# Patient Record
Sex: Female | Born: 1935 | Race: White | Hispanic: No | Smoking: Never smoker
Health system: Southern US, Community
[De-identification: ages and names within clinical notes are randomized; demographics above are authoritative.]

## PROBLEM LIST (undated history)

## (undated) DIAGNOSIS — I10 Essential (primary) hypertension: Secondary | ICD-10-CM

## (undated) DIAGNOSIS — Z8719 Personal history of other diseases of the digestive system: Secondary | ICD-10-CM

## (undated) DIAGNOSIS — E785 Hyperlipidemia, unspecified: Secondary | ICD-10-CM

## (undated) DIAGNOSIS — N184 Chronic kidney disease, stage 4 (severe): Secondary | ICD-10-CM

## (undated) DIAGNOSIS — E079 Disorder of thyroid, unspecified: Secondary | ICD-10-CM

## (undated) DIAGNOSIS — M109 Gout, unspecified: Secondary | ICD-10-CM

## (undated) HISTORY — PX: BREAST SURGERY: SHX581

---

## 2004-08-26 ENCOUNTER — Ambulatory Visit: Payer: Self-pay | Admitting: Gastroenterology

## 2004-12-29 ENCOUNTER — Ambulatory Visit: Payer: Self-pay | Admitting: Internal Medicine

## 2006-01-11 ENCOUNTER — Ambulatory Visit: Payer: Self-pay | Admitting: Internal Medicine

## 2006-05-28 ENCOUNTER — Ambulatory Visit: Payer: Self-pay

## 2006-07-21 ENCOUNTER — Ambulatory Visit: Payer: Self-pay | Admitting: Otolaryngology

## 2007-01-17 ENCOUNTER — Ambulatory Visit: Payer: Self-pay | Admitting: Internal Medicine

## 2008-01-19 ENCOUNTER — Ambulatory Visit: Payer: Self-pay | Admitting: Internal Medicine

## 2009-03-19 ENCOUNTER — Ambulatory Visit: Payer: Self-pay | Admitting: Internal Medicine

## 2009-04-03 ENCOUNTER — Inpatient Hospital Stay: Payer: Self-pay | Admitting: Internal Medicine

## 2009-04-15 ENCOUNTER — Emergency Department: Payer: Self-pay | Admitting: Internal Medicine

## 2009-09-16 ENCOUNTER — Ambulatory Visit: Payer: Self-pay | Admitting: Internal Medicine

## 2011-10-28 ENCOUNTER — Ambulatory Visit: Payer: Self-pay | Admitting: Internal Medicine

## 2013-09-04 ENCOUNTER — Emergency Department: Payer: Self-pay | Admitting: Emergency Medicine

## 2013-09-04 LAB — BODY FLUID CELL COUNT WITH DIFFERENTIAL
Basophil: 0 %
EOS PCT: 0 %
Lymphocytes: 2 %
Neutrophils: 88 %
Nucleated Cell Count: 29924 /mm3
OTHER CELLS BF: 0 %
OTHER MONONUCLEAR CELLS: 10 %

## 2013-09-04 LAB — SYNOVIAL FLUID, CRYSTAL

## 2013-09-08 LAB — BODY FLUID CULTURE

## 2013-10-12 ENCOUNTER — Other Ambulatory Visit: Payer: Self-pay | Admitting: Rheumatology

## 2013-10-12 LAB — SYNOVIAL CELL COUNT + DIFF, W/ CRYSTALS
Basophil: 0 %
Eosinophil: 0 %
LYMPHS PCT: 3 %
Neutrophils: 87 %
Nucleated Cell Count: 1299 /mm3
Other Cells BF: 2 %
Other Mononuclear Cells: 8 %

## 2013-12-26 ENCOUNTER — Ambulatory Visit: Payer: Self-pay | Admitting: Internal Medicine

## 2014-01-15 ENCOUNTER — Emergency Department: Payer: Self-pay | Admitting: Emergency Medicine

## 2014-01-15 LAB — CK TOTAL AND CKMB (NOT AT ARMC)
CK, Total: 72 U/L
CK-MB: 1.6 ng/mL (ref 0.5–3.6)

## 2014-01-15 LAB — URINALYSIS, COMPLETE
Bilirubin,UR: NEGATIVE
Glucose,UR: NEGATIVE mg/dL (ref 0–75)
Ketone: NEGATIVE
Nitrite: POSITIVE
PH: 5 (ref 4.5–8.0)
Protein: NEGATIVE
RBC,UR: 1 /HPF (ref 0–5)
SPECIFIC GRAVITY: 1.014 (ref 1.003–1.030)
Squamous Epithelial: <1

## 2014-01-15 LAB — CBC
HCT: 40.4 % (ref 35.0–47.0)
HGB: 12.8 g/dL (ref 12.0–16.0)
MCH: 29.7 pg (ref 26.0–34.0)
MCHC: 31.7 g/dL — AB (ref 32.0–36.0)
MCV: 94 fL (ref 80–100)
Platelet: 275 10*3/uL (ref 150–440)
RBC: 4.3 10*6/uL (ref 3.80–5.20)
RDW: 14.8 % — ABNORMAL HIGH (ref 11.5–14.5)
WBC: 11.9 10*3/uL — AB (ref 3.6–11.0)

## 2014-01-15 LAB — COMPREHENSIVE METABOLIC PANEL
ALK PHOS: 94 U/L
ALT: 17 U/L
Albumin: 3.7 g/dL (ref 3.4–5.0)
Anion Gap: 6 — ABNORMAL LOW (ref 7–16)
BUN: 35 mg/dL — ABNORMAL HIGH (ref 7–18)
Bilirubin,Total: 0.3 mg/dL (ref 0.2–1.0)
CREATININE: 1.5 mg/dL — AB (ref 0.60–1.30)
Calcium, Total: 9 mg/dL (ref 8.5–10.1)
Chloride: 110 mmol/L — ABNORMAL HIGH (ref 98–107)
Co2: 24 mmol/L (ref 21–32)
GFR CALC AF AMER: 35 — AB
GFR CALC NON AF AMER: 31 — AB
GLUCOSE: 105 mg/dL — AB (ref 65–99)
Osmolality: 288 (ref 275–301)
Potassium: 4.6 mmol/L (ref 3.5–5.1)
SGOT(AST): 27 U/L (ref 15–37)
Sodium: 140 mmol/L (ref 136–145)
Total Protein: 8.9 g/dL — ABNORMAL HIGH (ref 6.4–8.2)

## 2014-01-15 LAB — TROPONIN I

## 2014-01-17 LAB — URINE CULTURE

## 2014-06-17 ENCOUNTER — Emergency Department: Payer: Self-pay | Admitting: Emergency Medicine

## 2014-06-17 LAB — BASIC METABOLIC PANEL
ANION GAP: 7 (ref 7–16)
BUN: 33 mg/dL — AB (ref 7–18)
CHLORIDE: 111 mmol/L — AB (ref 98–107)
CO2: 24 mmol/L (ref 21–32)
CREATININE: 1.85 mg/dL — AB (ref 0.60–1.30)
Calcium, Total: 8.7 mg/dL (ref 8.5–10.1)
GFR CALC AF AMER: 33 — AB
GFR CALC NON AF AMER: 27 — AB
Glucose: 100 mg/dL — ABNORMAL HIGH (ref 65–99)
OSMOLALITY: 290 (ref 275–301)
POTASSIUM: 4.5 mmol/L (ref 3.5–5.1)
SODIUM: 142 mmol/L (ref 136–145)

## 2014-06-17 LAB — CBC
HCT: 32.6 % — AB (ref 35.0–47.0)
HGB: 10.3 g/dL — AB (ref 12.0–16.0)
MCH: 29.4 pg (ref 26.0–34.0)
MCHC: 31.6 g/dL — ABNORMAL LOW (ref 32.0–36.0)
MCV: 93 fL (ref 80–100)
Platelet: 242 10*3/uL (ref 150–440)
RBC: 3.51 10*6/uL — AB (ref 3.80–5.20)
RDW: 15.6 % — AB (ref 11.5–14.5)
WBC: 10 10*3/uL (ref 3.6–11.0)

## 2014-06-17 LAB — TROPONIN I: Troponin-I: <0.02 ng/mL

## 2014-07-12 DIAGNOSIS — E538 Deficiency of other specified B group vitamins: Secondary | ICD-10-CM | POA: Diagnosis not present

## 2014-07-12 DIAGNOSIS — M545 Low back pain: Secondary | ICD-10-CM | POA: Diagnosis not present

## 2014-07-12 DIAGNOSIS — M4856XA Collapsed vertebra, not elsewhere classified, lumbar region, initial encounter for fracture: Secondary | ICD-10-CM | POA: Diagnosis not present

## 2014-07-17 DIAGNOSIS — S32010A Wedge compression fracture of first lumbar vertebra, initial encounter for closed fracture: Secondary | ICD-10-CM | POA: Diagnosis not present

## 2014-07-18 ENCOUNTER — Ambulatory Visit: Payer: Self-pay | Admitting: Unknown Physician Specialty

## 2014-07-18 DIAGNOSIS — M5386 Other specified dorsopathies, lumbar region: Secondary | ICD-10-CM | POA: Diagnosis not present

## 2014-07-18 DIAGNOSIS — M4856XA Collapsed vertebra, not elsewhere classified, lumbar region, initial encounter for fracture: Secondary | ICD-10-CM | POA: Diagnosis not present

## 2014-07-18 DIAGNOSIS — M5136 Other intervertebral disc degeneration, lumbar region: Secondary | ICD-10-CM | POA: Diagnosis not present

## 2014-07-18 DIAGNOSIS — M542 Cervicalgia: Secondary | ICD-10-CM | POA: Diagnosis not present

## 2014-07-18 DIAGNOSIS — M47816 Spondylosis without myelopathy or radiculopathy, lumbar region: Secondary | ICD-10-CM | POA: Diagnosis not present

## 2014-07-18 DIAGNOSIS — M4854XA Collapsed vertebra, not elsewhere classified, thoracic region, initial encounter for fracture: Secondary | ICD-10-CM | POA: Diagnosis not present

## 2014-07-18 DIAGNOSIS — S32010A Wedge compression fracture of first lumbar vertebra, initial encounter for closed fracture: Secondary | ICD-10-CM | POA: Diagnosis not present

## 2014-07-18 DIAGNOSIS — M5022 Other cervical disc displacement, mid-cervical region: Secondary | ICD-10-CM | POA: Diagnosis not present

## 2014-07-18 DIAGNOSIS — M4846XA Fatigue fracture of vertebra, lumbar region, initial encounter for fracture: Secondary | ICD-10-CM | POA: Diagnosis not present

## 2014-07-18 DIAGNOSIS — I714 Abdominal aortic aneurysm, without rupture: Secondary | ICD-10-CM | POA: Diagnosis not present

## 2014-07-18 DIAGNOSIS — S161XXA Strain of muscle, fascia and tendon at neck level, initial encounter: Secondary | ICD-10-CM | POA: Diagnosis not present

## 2014-07-18 DIAGNOSIS — M4806 Spinal stenosis, lumbar region: Secondary | ICD-10-CM | POA: Diagnosis not present

## 2014-07-23 ENCOUNTER — Ambulatory Visit: Payer: Self-pay | Admitting: Orthopedic Surgery

## 2014-07-24 ENCOUNTER — Ambulatory Visit: Payer: Self-pay | Admitting: Orthopedic Surgery

## 2014-07-24 DIAGNOSIS — S32010A Wedge compression fracture of first lumbar vertebra, initial encounter for closed fracture: Secondary | ICD-10-CM | POA: Diagnosis not present

## 2014-07-24 DIAGNOSIS — R0602 Shortness of breath: Secondary | ICD-10-CM | POA: Diagnosis not present

## 2014-07-24 DIAGNOSIS — Z9889 Other specified postprocedural states: Secondary | ICD-10-CM | POA: Diagnosis not present

## 2014-07-24 DIAGNOSIS — Z79899 Other long term (current) drug therapy: Secondary | ICD-10-CM | POA: Diagnosis not present

## 2014-07-24 DIAGNOSIS — M810 Age-related osteoporosis without current pathological fracture: Secondary | ICD-10-CM | POA: Diagnosis not present

## 2014-07-24 DIAGNOSIS — M8008XA Age-related osteoporosis with current pathological fracture, vertebra(e), initial encounter for fracture: Secondary | ICD-10-CM | POA: Diagnosis not present

## 2014-07-24 DIAGNOSIS — Z8249 Family history of ischemic heart disease and other diseases of the circulatory system: Secondary | ICD-10-CM | POA: Diagnosis not present

## 2014-07-24 DIAGNOSIS — E785 Hyperlipidemia, unspecified: Secondary | ICD-10-CM | POA: Diagnosis not present

## 2014-07-24 DIAGNOSIS — S32000A Wedge compression fracture of unspecified lumbar vertebra, initial encounter for closed fracture: Secondary | ICD-10-CM | POA: Diagnosis not present

## 2014-07-24 DIAGNOSIS — Z9841 Cataract extraction status, right eye: Secondary | ICD-10-CM | POA: Diagnosis not present

## 2014-07-24 DIAGNOSIS — I129 Hypertensive chronic kidney disease with stage 1 through stage 4 chronic kidney disease, or unspecified chronic kidney disease: Secondary | ICD-10-CM | POA: Diagnosis not present

## 2014-07-24 DIAGNOSIS — E039 Hypothyroidism, unspecified: Secondary | ICD-10-CM | POA: Diagnosis not present

## 2014-07-24 DIAGNOSIS — E1121 Type 2 diabetes mellitus with diabetic nephropathy: Secondary | ICD-10-CM | POA: Diagnosis not present

## 2014-07-24 DIAGNOSIS — H9193 Unspecified hearing loss, bilateral: Secondary | ICD-10-CM | POA: Diagnosis not present

## 2014-07-24 DIAGNOSIS — E790 Hyperuricemia without signs of inflammatory arthritis and tophaceous disease: Secondary | ICD-10-CM | POA: Diagnosis not present

## 2014-07-24 DIAGNOSIS — L300 Nummular dermatitis: Secondary | ICD-10-CM | POA: Diagnosis not present

## 2014-07-24 DIAGNOSIS — S32019A Unspecified fracture of first lumbar vertebra, initial encounter for closed fracture: Secondary | ICD-10-CM | POA: Diagnosis not present

## 2014-07-24 DIAGNOSIS — E538 Deficiency of other specified B group vitamins: Secondary | ICD-10-CM | POA: Diagnosis not present

## 2014-07-24 DIAGNOSIS — Z9842 Cataract extraction status, left eye: Secondary | ICD-10-CM | POA: Diagnosis not present

## 2014-07-24 DIAGNOSIS — N183 Chronic kidney disease, stage 3 (moderate): Secondary | ICD-10-CM | POA: Diagnosis not present

## 2014-07-24 DIAGNOSIS — M170 Bilateral primary osteoarthritis of knee: Secondary | ICD-10-CM | POA: Diagnosis not present

## 2014-07-24 DIAGNOSIS — I447 Left bundle-branch block, unspecified: Secondary | ICD-10-CM | POA: Diagnosis not present

## 2014-07-24 DIAGNOSIS — E559 Vitamin D deficiency, unspecified: Secondary | ICD-10-CM | POA: Diagnosis not present

## 2014-07-24 DIAGNOSIS — Z8489 Family history of other specified conditions: Secondary | ICD-10-CM | POA: Diagnosis not present

## 2014-08-08 DIAGNOSIS — Z9889 Other specified postprocedural states: Secondary | ICD-10-CM | POA: Diagnosis not present

## 2014-09-18 DIAGNOSIS — I129 Hypertensive chronic kidney disease with stage 1 through stage 4 chronic kidney disease, or unspecified chronic kidney disease: Secondary | ICD-10-CM | POA: Diagnosis not present

## 2014-09-18 DIAGNOSIS — D631 Anemia in chronic kidney disease: Secondary | ICD-10-CM | POA: Diagnosis not present

## 2014-09-18 DIAGNOSIS — N2581 Secondary hyperparathyroidism of renal origin: Secondary | ICD-10-CM | POA: Diagnosis not present

## 2014-09-18 DIAGNOSIS — E1122 Type 2 diabetes mellitus with diabetic chronic kidney disease: Secondary | ICD-10-CM | POA: Diagnosis not present

## 2014-09-18 DIAGNOSIS — N184 Chronic kidney disease, stage 4 (severe): Secondary | ICD-10-CM | POA: Diagnosis not present

## 2014-09-19 DIAGNOSIS — E538 Deficiency of other specified B group vitamins: Secondary | ICD-10-CM | POA: Diagnosis not present

## 2014-09-26 DIAGNOSIS — N184 Chronic kidney disease, stage 4 (severe): Secondary | ICD-10-CM | POA: Diagnosis not present

## 2014-10-05 DIAGNOSIS — N2581 Secondary hyperparathyroidism of renal origin: Secondary | ICD-10-CM | POA: Diagnosis not present

## 2014-10-05 DIAGNOSIS — N184 Chronic kidney disease, stage 4 (severe): Secondary | ICD-10-CM | POA: Diagnosis not present

## 2014-10-05 DIAGNOSIS — R809 Proteinuria, unspecified: Secondary | ICD-10-CM | POA: Diagnosis not present

## 2014-10-05 DIAGNOSIS — E1122 Type 2 diabetes mellitus with diabetic chronic kidney disease: Secondary | ICD-10-CM | POA: Diagnosis not present

## 2014-10-05 DIAGNOSIS — I129 Hypertensive chronic kidney disease with stage 1 through stage 4 chronic kidney disease, or unspecified chronic kidney disease: Secondary | ICD-10-CM | POA: Diagnosis not present

## 2014-10-06 DIAGNOSIS — H5319 Other subjective visual disturbances: Secondary | ICD-10-CM | POA: Diagnosis not present

## 2014-10-15 LAB — SURGICAL PATHOLOGY

## 2014-10-19 DIAGNOSIS — I129 Hypertensive chronic kidney disease with stage 1 through stage 4 chronic kidney disease, or unspecified chronic kidney disease: Secondary | ICD-10-CM | POA: Diagnosis not present

## 2014-10-19 DIAGNOSIS — N184 Chronic kidney disease, stage 4 (severe): Secondary | ICD-10-CM | POA: Diagnosis not present

## 2014-10-21 NOTE — Op Note (Signed)
PATIENT NAME:  Jeanette CapriceRAINEY, Judyann M MR#:  [id number] DATE OF BIRTH:  February 21, 1923  DATE OF PROCEDURE:  07/24/2014  PREOPERATIVE DIAGNOSIS: L1 compression fracture.   POSTOPERATIVE DIAGNOSIS: L1 compression fracture.   PROCEDURE: L1 kyphoplasty.   ANESTHESIA: MAC.   SURGEON: Kennedy BuckerMichael Orlanda Frankum, MD   DESCRIPTION OF PROCEDURE: The patient was brought to the operating room and after adequate sedation was given, the patient was placed prone, and AP and lateral imaging obtained of the L1 vertebral body. After a timeout procedure was completed, 10 mL of 1% Xylocaine was infiltrated subcutaneously on either side of L1 at the appropriate level for injection. The back was then prepped and draped in the usual sterile fashion, appropriate patient identification and timeout procedure completed. Spinal needle was used to get down to the pedicle and a mixture of 0.5% Sensorcaine with epinephrine and 1% Xylocaine was infiltrated along the tract of the skin. A small stab incision was made and a trocar advanced into the vertebral body and a biopsy obtained. Drilling was carried out and then a balloon inflated approximately 2.5 mL. When the cement was the appropriate consistency, the vertebral body was filled with good fill, partial correction of the deformity at L1 with approximately 3.5 mL cement infiltrated. The trocar was removed and permanent C-arm views were obtained. The wound was closed with Dermabond and covered with a Band-Aid. The patient was sent to the recovery room in stable condition.   ESTIMATED BLOOD LOSS: Minimal.   COMPLICATIONS: None.   SPECIMEN: L1 vertebral body biopsy.    ____________________________ Leitha SchullerMichael J. Ommie Degeorge, MD mjm:bm D: 07/24/2014 18:57:31 ET T: 07/25/2014 01:02:52 ET JOB#: [id number]  cc: Leitha SchullerMichael J. Jashua Knaak, MD, <Dictator> Leitha SchullerMICHAEL J Staceyann Knouff MD ELECTRONICALLY SIGNED 07/25/2014 8:08

## 2014-11-08 DIAGNOSIS — H3531 Nonexudative age-related macular degeneration: Secondary | ICD-10-CM | POA: Diagnosis not present

## 2014-12-06 DIAGNOSIS — E039 Hypothyroidism, unspecified: Secondary | ICD-10-CM | POA: Diagnosis not present

## 2014-12-06 DIAGNOSIS — E538 Deficiency of other specified B group vitamins: Secondary | ICD-10-CM | POA: Diagnosis not present

## 2014-12-06 DIAGNOSIS — M810 Age-related osteoporosis without current pathological fracture: Secondary | ICD-10-CM | POA: Diagnosis not present

## 2014-12-06 DIAGNOSIS — E1129 Type 2 diabetes mellitus with other diabetic kidney complication: Secondary | ICD-10-CM | POA: Diagnosis not present

## 2015-01-04 DIAGNOSIS — E1122 Type 2 diabetes mellitus with diabetic chronic kidney disease: Secondary | ICD-10-CM | POA: Diagnosis not present

## 2015-01-04 DIAGNOSIS — R809 Proteinuria, unspecified: Secondary | ICD-10-CM | POA: Diagnosis not present

## 2015-01-04 DIAGNOSIS — N2581 Secondary hyperparathyroidism of renal origin: Secondary | ICD-10-CM | POA: Diagnosis not present

## 2015-01-04 DIAGNOSIS — I10 Essential (primary) hypertension: Secondary | ICD-10-CM | POA: Diagnosis not present

## 2015-01-04 DIAGNOSIS — N184 Chronic kidney disease, stage 4 (severe): Secondary | ICD-10-CM | POA: Diagnosis not present

## 2015-01-07 DIAGNOSIS — E538 Deficiency of other specified B group vitamins: Secondary | ICD-10-CM | POA: Diagnosis not present

## 2015-01-30 DIAGNOSIS — M810 Age-related osteoporosis without current pathological fracture: Secondary | ICD-10-CM | POA: Diagnosis not present

## 2015-01-30 DIAGNOSIS — N183 Chronic kidney disease, stage 3 (moderate): Secondary | ICD-10-CM | POA: Diagnosis not present

## 2015-02-06 DIAGNOSIS — E538 Deficiency of other specified B group vitamins: Secondary | ICD-10-CM | POA: Diagnosis not present

## 2015-02-22 DIAGNOSIS — M810 Age-related osteoporosis without current pathological fracture: Secondary | ICD-10-CM | POA: Diagnosis not present

## 2015-03-11 DIAGNOSIS — E538 Deficiency of other specified B group vitamins: Secondary | ICD-10-CM | POA: Diagnosis not present

## 2015-03-31 DIAGNOSIS — Z23 Encounter for immunization: Secondary | ICD-10-CM | POA: Diagnosis not present

## 2015-04-04 DIAGNOSIS — D631 Anemia in chronic kidney disease: Secondary | ICD-10-CM | POA: Diagnosis not present

## 2015-04-04 DIAGNOSIS — N184 Chronic kidney disease, stage 4 (severe): Secondary | ICD-10-CM | POA: Diagnosis not present

## 2015-04-04 DIAGNOSIS — I10 Essential (primary) hypertension: Secondary | ICD-10-CM | POA: Diagnosis not present

## 2015-04-04 DIAGNOSIS — N2581 Secondary hyperparathyroidism of renal origin: Secondary | ICD-10-CM | POA: Diagnosis not present

## 2015-04-09 DIAGNOSIS — I129 Hypertensive chronic kidney disease with stage 1 through stage 4 chronic kidney disease, or unspecified chronic kidney disease: Secondary | ICD-10-CM | POA: Diagnosis not present

## 2015-04-09 DIAGNOSIS — E1122 Type 2 diabetes mellitus with diabetic chronic kidney disease: Secondary | ICD-10-CM | POA: Diagnosis not present

## 2015-04-09 DIAGNOSIS — R319 Hematuria, unspecified: Secondary | ICD-10-CM | POA: Diagnosis not present

## 2015-04-09 DIAGNOSIS — R809 Proteinuria, unspecified: Secondary | ICD-10-CM | POA: Diagnosis not present

## 2015-04-09 DIAGNOSIS — N184 Chronic kidney disease, stage 4 (severe): Secondary | ICD-10-CM | POA: Diagnosis not present

## 2015-04-09 DIAGNOSIS — N2581 Secondary hyperparathyroidism of renal origin: Secondary | ICD-10-CM | POA: Diagnosis not present

## 2015-04-09 DIAGNOSIS — D631 Anemia in chronic kidney disease: Secondary | ICD-10-CM | POA: Diagnosis not present

## 2015-04-11 DIAGNOSIS — E538 Deficiency of other specified B group vitamins: Secondary | ICD-10-CM | POA: Diagnosis not present

## 2015-05-15 DIAGNOSIS — E538 Deficiency of other specified B group vitamins: Secondary | ICD-10-CM | POA: Diagnosis not present

## 2015-06-07 DIAGNOSIS — N189 Chronic kidney disease, unspecified: Secondary | ICD-10-CM | POA: Diagnosis not present

## 2015-06-07 DIAGNOSIS — D631 Anemia in chronic kidney disease: Secondary | ICD-10-CM | POA: Diagnosis not present

## 2015-06-07 DIAGNOSIS — M810 Age-related osteoporosis without current pathological fracture: Secondary | ICD-10-CM | POA: Diagnosis not present

## 2015-06-07 DIAGNOSIS — E538 Deficiency of other specified B group vitamins: Secondary | ICD-10-CM | POA: Diagnosis not present

## 2015-06-07 DIAGNOSIS — I10 Essential (primary) hypertension: Secondary | ICD-10-CM | POA: Diagnosis not present

## 2015-06-07 DIAGNOSIS — E559 Vitamin D deficiency, unspecified: Secondary | ICD-10-CM | POA: Diagnosis not present

## 2015-06-07 DIAGNOSIS — R634 Abnormal weight loss: Secondary | ICD-10-CM | POA: Diagnosis not present

## 2015-06-07 DIAGNOSIS — E1122 Type 2 diabetes mellitus with diabetic chronic kidney disease: Secondary | ICD-10-CM | POA: Diagnosis not present

## 2015-06-07 DIAGNOSIS — N183 Chronic kidney disease, stage 3 (moderate): Secondary | ICD-10-CM | POA: Diagnosis not present

## 2015-06-07 DIAGNOSIS — E039 Hypothyroidism, unspecified: Secondary | ICD-10-CM | POA: Diagnosis not present

## 2015-06-14 DIAGNOSIS — E538 Deficiency of other specified B group vitamins: Secondary | ICD-10-CM | POA: Diagnosis not present

## 2015-07-16 DIAGNOSIS — E538 Deficiency of other specified B group vitamins: Secondary | ICD-10-CM | POA: Diagnosis not present

## 2015-08-12 DIAGNOSIS — E538 Deficiency of other specified B group vitamins: Secondary | ICD-10-CM | POA: Diagnosis not present

## 2015-08-12 DIAGNOSIS — E790 Hyperuricemia without signs of inflammatory arthritis and tophaceous disease: Secondary | ICD-10-CM | POA: Diagnosis not present

## 2015-08-12 DIAGNOSIS — I10 Essential (primary) hypertension: Secondary | ICD-10-CM | POA: Diagnosis not present

## 2015-08-12 DIAGNOSIS — E039 Hypothyroidism, unspecified: Secondary | ICD-10-CM | POA: Diagnosis not present

## 2015-08-12 DIAGNOSIS — D631 Anemia in chronic kidney disease: Secondary | ICD-10-CM | POA: Diagnosis not present

## 2015-08-12 DIAGNOSIS — N183 Chronic kidney disease, stage 3 (moderate): Secondary | ICD-10-CM | POA: Diagnosis not present

## 2015-08-12 DIAGNOSIS — M25511 Pain in right shoulder: Secondary | ICD-10-CM | POA: Diagnosis not present

## 2015-08-15 DIAGNOSIS — M7551 Bursitis of right shoulder: Secondary | ICD-10-CM | POA: Diagnosis not present

## 2015-08-15 DIAGNOSIS — M25511 Pain in right shoulder: Secondary | ICD-10-CM | POA: Diagnosis not present

## 2015-08-16 DIAGNOSIS — E538 Deficiency of other specified B group vitamins: Secondary | ICD-10-CM | POA: Diagnosis not present

## 2015-08-29 DIAGNOSIS — M810 Age-related osteoporosis without current pathological fracture: Secondary | ICD-10-CM | POA: Diagnosis not present

## 2015-09-16 DIAGNOSIS — A499 Bacterial infection, unspecified: Secondary | ICD-10-CM | POA: Diagnosis not present

## 2015-09-16 DIAGNOSIS — R700 Elevated erythrocyte sedimentation rate: Secondary | ICD-10-CM | POA: Diagnosis not present

## 2015-09-16 DIAGNOSIS — F0390 Unspecified dementia without behavioral disturbance: Secondary | ICD-10-CM | POA: Diagnosis not present

## 2015-09-16 DIAGNOSIS — R079 Chest pain, unspecified: Secondary | ICD-10-CM | POA: Diagnosis not present

## 2015-09-16 DIAGNOSIS — N390 Urinary tract infection, site not specified: Secondary | ICD-10-CM | POA: Diagnosis not present

## 2015-09-16 DIAGNOSIS — E538 Deficiency of other specified B group vitamins: Secondary | ICD-10-CM | POA: Diagnosis not present

## 2015-09-16 DIAGNOSIS — N184 Chronic kidney disease, stage 4 (severe): Secondary | ICD-10-CM | POA: Diagnosis not present

## 2015-09-16 DIAGNOSIS — I10 Essential (primary) hypertension: Secondary | ICD-10-CM | POA: Diagnosis not present

## 2015-09-16 DIAGNOSIS — M791 Myalgia: Secondary | ICD-10-CM | POA: Diagnosis not present

## 2015-09-16 DIAGNOSIS — E039 Hypothyroidism, unspecified: Secondary | ICD-10-CM | POA: Diagnosis not present

## 2015-09-16 DIAGNOSIS — K5900 Constipation, unspecified: Secondary | ICD-10-CM | POA: Diagnosis not present

## 2015-09-16 DIAGNOSIS — I447 Left bundle-branch block, unspecified: Secondary | ICD-10-CM | POA: Diagnosis not present

## 2015-09-16 DIAGNOSIS — N183 Chronic kidney disease, stage 3 (moderate): Secondary | ICD-10-CM | POA: Diagnosis not present

## 2015-09-16 DIAGNOSIS — R5381 Other malaise: Secondary | ICD-10-CM | POA: Diagnosis not present

## 2015-09-26 DIAGNOSIS — E7800 Pure hypercholesterolemia, unspecified: Secondary | ICD-10-CM | POA: Diagnosis not present

## 2015-09-26 DIAGNOSIS — I10 Essential (primary) hypertension: Secondary | ICD-10-CM | POA: Diagnosis not present

## 2015-09-26 DIAGNOSIS — I447 Left bundle-branch block, unspecified: Secondary | ICD-10-CM | POA: Diagnosis not present

## 2015-10-02 DIAGNOSIS — R809 Proteinuria, unspecified: Secondary | ICD-10-CM | POA: Diagnosis not present

## 2015-10-02 DIAGNOSIS — I129 Hypertensive chronic kidney disease with stage 1 through stage 4 chronic kidney disease, or unspecified chronic kidney disease: Secondary | ICD-10-CM | POA: Diagnosis not present

## 2015-10-02 DIAGNOSIS — N2581 Secondary hyperparathyroidism of renal origin: Secondary | ICD-10-CM | POA: Diagnosis not present

## 2015-10-02 DIAGNOSIS — N184 Chronic kidney disease, stage 4 (severe): Secondary | ICD-10-CM | POA: Diagnosis not present

## 2015-10-16 DIAGNOSIS — E538 Deficiency of other specified B group vitamins: Secondary | ICD-10-CM | POA: Diagnosis not present

## 2015-11-14 DIAGNOSIS — Z23 Encounter for immunization: Secondary | ICD-10-CM | POA: Diagnosis not present

## 2015-12-17 DIAGNOSIS — R768 Other specified abnormal immunological findings in serum: Secondary | ICD-10-CM | POA: Diagnosis not present

## 2015-12-17 DIAGNOSIS — E1122 Type 2 diabetes mellitus with diabetic chronic kidney disease: Secondary | ICD-10-CM | POA: Diagnosis not present

## 2015-12-17 DIAGNOSIS — E538 Deficiency of other specified B group vitamins: Secondary | ICD-10-CM | POA: Diagnosis not present

## 2015-12-17 DIAGNOSIS — N184 Chronic kidney disease, stage 4 (severe): Secondary | ICD-10-CM | POA: Diagnosis not present

## 2015-12-17 DIAGNOSIS — M810 Age-related osteoporosis without current pathological fracture: Secondary | ICD-10-CM | POA: Diagnosis not present

## 2015-12-17 DIAGNOSIS — I10 Essential (primary) hypertension: Secondary | ICD-10-CM | POA: Diagnosis not present

## 2015-12-17 DIAGNOSIS — R1310 Dysphagia, unspecified: Secondary | ICD-10-CM | POA: Diagnosis not present

## 2015-12-17 DIAGNOSIS — E039 Hypothyroidism, unspecified: Secondary | ICD-10-CM | POA: Diagnosis not present

## 2015-12-19 DIAGNOSIS — M19072 Primary osteoarthritis, left ankle and foot: Secondary | ICD-10-CM | POA: Diagnosis not present

## 2015-12-19 DIAGNOSIS — M25511 Pain in right shoulder: Secondary | ICD-10-CM | POA: Diagnosis not present

## 2015-12-19 DIAGNOSIS — M25472 Effusion, left ankle: Secondary | ICD-10-CM | POA: Diagnosis not present

## 2015-12-19 DIAGNOSIS — M79641 Pain in right hand: Secondary | ICD-10-CM | POA: Diagnosis not present

## 2015-12-27 DIAGNOSIS — M25572 Pain in left ankle and joints of left foot: Secondary | ICD-10-CM | POA: Diagnosis not present

## 2015-12-27 DIAGNOSIS — N184 Chronic kidney disease, stage 4 (severe): Secondary | ICD-10-CM | POA: Diagnosis not present

## 2015-12-27 DIAGNOSIS — E790 Hyperuricemia without signs of inflammatory arthritis and tophaceous disease: Secondary | ICD-10-CM | POA: Diagnosis not present

## 2015-12-29 ENCOUNTER — Emergency Department: Payer: Medicare Other

## 2015-12-29 ENCOUNTER — Emergency Department
Admission: EM | Admit: 2015-12-29 | Discharge: 2015-12-29 | Disposition: A | Discharge status: Home / Self Care | Payer: Medicare Other | Attending: Emergency Medicine | Admitting: Emergency Medicine

## 2015-12-29 ENCOUNTER — Encounter: Payer: Self-pay | Admitting: *Deleted

## 2015-12-29 DIAGNOSIS — M79662 Pain in left lower leg: Secondary | ICD-10-CM | POA: Diagnosis present

## 2015-12-29 DIAGNOSIS — M7989 Other specified soft tissue disorders: Secondary | ICD-10-CM | POA: Diagnosis not present

## 2015-12-29 DIAGNOSIS — M109 Gout, unspecified: Secondary | ICD-10-CM

## 2015-12-29 DIAGNOSIS — M25562 Pain in left knee: Secondary | ICD-10-CM | POA: Diagnosis not present

## 2015-12-29 DIAGNOSIS — M10062 Idiopathic gout, left knee: Secondary | ICD-10-CM | POA: Diagnosis not present

## 2015-12-29 DIAGNOSIS — M25569 Pain in unspecified knee: Secondary | ICD-10-CM | POA: Diagnosis not present

## 2015-12-29 DIAGNOSIS — I10 Essential (primary) hypertension: Secondary | ICD-10-CM | POA: Diagnosis not present

## 2015-12-29 HISTORY — DX: Gout, unspecified: M10.9

## 2015-12-29 HISTORY — DX: Essential (primary) hypertension: I10

## 2015-12-29 HISTORY — DX: Disorder of thyroid, unspecified: E07.9

## 2015-12-29 LAB — CBC WITH DIFFERENTIAL/PLATELET
BASOS ABS: 0.1 10*3/uL (ref 0–0.1)
Basophils Relative: 1 %
EOS PCT: 2 %
Eosinophils Absolute: 0.2 10*3/uL (ref 0–0.7)
HCT: 33 % — ABNORMAL LOW (ref 35.0–47.0)
Hemoglobin: 10.8 g/dL — ABNORMAL LOW (ref 12.0–16.0)
LYMPHS PCT: 11 %
Lymphs Abs: 1.2 10*3/uL (ref 1.0–3.6)
MCH: 30.1 pg (ref 26.0–34.0)
MCHC: 32.7 g/dL (ref 32.0–36.0)
MCV: 92 fL (ref 80.0–100.0)
MONO ABS: 1.4 10*3/uL — AB (ref 0.2–0.9)
MONOS PCT: 13 %
Neutro Abs: 8 10*3/uL — ABNORMAL HIGH (ref 1.4–6.5)
Neutrophils Relative %: 73 %
PLATELETS: 250 10*3/uL (ref 150–440)
RBC: 3.59 MIL/uL — ABNORMAL LOW (ref 3.80–5.20)
RDW: 15.4 % — AB (ref 11.5–14.5)
WBC: 10.9 10*3/uL (ref 3.6–11.0)

## 2015-12-29 LAB — COMPREHENSIVE METABOLIC PANEL
ALBUMIN: 3.1 g/dL — AB (ref 3.5–5.0)
ALK PHOS: 50 U/L (ref 38–126)
ALT: 10 U/L — ABNORMAL LOW (ref 14–54)
AST: 12 U/L — AB (ref 15–41)
Anion gap: 8 (ref 5–15)
BILIRUBIN TOTAL: 0.9 mg/dL (ref 0.3–1.2)
BUN: 46 mg/dL — AB (ref 6–20)
CALCIUM: 8.8 mg/dL — AB (ref 8.9–10.3)
CO2: 24 mmol/L (ref 22–32)
Chloride: 106 mmol/L (ref 101–111)
Creatinine, Ser: 2.06 mg/dL — ABNORMAL HIGH (ref 0.44–1.00)
GFR calc Af Amer: 23 mL/min — ABNORMAL LOW (ref >60)
GFR calc non Af Amer: 20 mL/min — ABNORMAL LOW (ref >60)
GLUCOSE: 123 mg/dL — AB (ref 65–99)
Potassium: 4.2 mmol/L (ref 3.5–5.1)
Sodium: 138 mmol/L (ref 135–145)
TOTAL PROTEIN: 7.1 g/dL (ref 6.5–8.1)

## 2015-12-29 LAB — URIC ACID: Uric Acid, Serum: 7.7 mg/dL — ABNORMAL HIGH (ref 2.3–6.6)

## 2015-12-29 MED ORDER — KETOROLAC TROMETHAMINE 30 MG/ML IJ SOLN
15.0000 mg | Freq: Once | INTRAMUSCULAR | Status: AC
  Administered 2015-12-29: 15 mg via INTRAVENOUS
  Filled 2015-12-29: qty 1

## 2015-12-29 MED ORDER — COLCHICINE 0.6 MG PO TABS: 0.6000 mg | ORAL_TABLET | Freq: Two times a day (BID) | ORAL | Status: DC

## 2015-12-29 MED ORDER — INDOMETHACIN 50 MG PO CAPS: 50.0000 mg | ORAL_CAPSULE | Freq: Two times a day (BID) | ORAL | Status: AC

## 2015-12-29 MED ORDER — PREDNISONE 20 MG PO TABS: 40.0000 mg | ORAL_TABLET | Freq: Every day | ORAL | Status: AC

## 2015-12-29 MED ORDER — COLCHICINE 0.6 MG PO TABS
0.6000 mg | ORAL_TABLET | Freq: Once | ORAL | Status: AC
  Administered 2015-12-29: 0.6 mg via ORAL
  Filled 2015-12-29: qty 1

## 2015-12-29 MED ORDER — METHYLPREDNISOLONE SODIUM SUCC 125 MG IJ SOLR
125.0000 mg | Freq: Once | INTRAMUSCULAR | Status: AC
  Administered 2015-12-29: 125 mg via INTRAVENOUS
  Filled 2015-12-29: qty 2

## 2015-12-29 NOTE — Discharge Instructions (Signed)

## 2015-12-29 NOTE — ED Provider Notes (Signed)
Time Seen: Approximately *0 7:30  I have reviewed the triage notes  Chief Complaint: Leg Pain   History of Present Illness: Catherine Anderson is a 82 y.o. female who presents with a previous history of gouty arthritis. Patient states she has had gout in her left knee in the past. She states this pain seems similar to gout that she's had previously but she states is "" been a while "". Patient states that she slept in her recliner last night and had a large amount of trouble trying to get out of the recliner. She describes some left leg swelling. She denies any chest pain or shortness of breath. She denies any trauma. She states she does have medication at home for gallop or cannot get to the medication the cause of the discomfort and was transported here by EMS. She denies any right knee pain of significance or swelling to this historian.   Past Medical History  Diagnosis Date  . Gout   . Hypertension   . Thyroid disease     There are no active problems to display for this patient.   History reviewed. No pertinent past surgical history.  History reviewed. No pertinent past surgical history.  No current outpatient prescriptions on file.  Allergies:  Review of patient's allergies indicates no known allergies.  Family History: History reviewed. No pertinent family history.  Social History: Social History  Substance Use Topics  . Smoking status: Unknown If Ever Smoked  . Smokeless tobacco: None  . Alcohol Use: None     Review of Systems:   10 point review of systems was performed and was otherwise negative:  Constitutional: No fever Eyes: No visual disturbances ENT: No sore throat, ear pain Cardiac: No chest pain Respiratory: No shortness of breath, wheezing, or stridor Abdomen: No abdominal pain, no vomiting, No diarrhea Endocrine: No weight loss, No night sweats Extremities: Bilateral peripheral edema left greater than the right. No cyanosis she denies any cold  sensation in her feet Skin: No rashes, easy bruising Neurologic: No focal weakness, trouble with speech or swollowing Urologic: No dysuria, Hematuria, or urinary frequency *  Physical Exam:  ED Triage Vitals  Enc Vitals Group     BP 12/29/15 0708 191/83 mmHg     Pulse Rate 12/29/15 0708 69     Resp 12/29/15 0708 18     Temp 12/29/15 0708 98.3 F (36.8 C)     Temp Source 12/29/15 0708 Oral     SpO2 12/29/15 0708 97 %     Weight 12/29/15 0708 114 lb (51.71 kg)     Height 12/29/15 0708 5\' 2"  (1.575 m)     Head Cir --      Peak Flow --      Pain Score 12/29/15 0709 10     Pain Loc --      Pain Edu? --      Excl. in GC? --     General: Awake , Alert , and Oriented times 3; GCS 15 Head: Normal cephalic , atraumatic Eyes: Pupils equal , round, reactive to light Nose/Throat: No nasal drainage, patent upper airway without erythema or exudate.  Neck: Supple, Full range of motion, No anterior adenopathy or palpable thyroid masses Lungs: Clear to ascultation without wheezes , rhonchi, or rales Heart: Regular rate, regular rhythm without murmurs , gallops , or rubs Abdomen: Soft, non tender without rebound, guarding , or rigidity; bowel sounds positive and symmetric in all 4 quadrants. No organomegaly .  Extremities: Joint swelling in the left knee without obvious effusion, erythema. The joint is well aligned. There is edema extending from the knee distally circumferential in nature without any focal calf tenderness or swelling Neurologic: normal ambulation, Motor symmetric without deficits, sensory intact Skin: warm, dry, no rashes   Labs:   All laboratory work was reviewed including any pertinent negatives or positives listed below:  Labs Reviewed  URIC ACID  COMPREHENSIVE METABOLIC PANEL  CBC WITH DIFFERENTIAL/PLATELET  Patient's uric acid level was slightly elevated  Radiology: *  Final result by Rad Results In Interface (12/29/15 08:47:17)   Narrative:   CLINICAL  DATA: Left lower extremity pain and swelling for 1 week.  EXAM: LEFT LOWER EXTREMITY VENOUS DOPPLER ULTRASOUND  TECHNIQUE: Gray-scale sonography with graded compression, as well as color Doppler and duplex ultrasound were performed to evaluate the lower extremity deep venous systems from the level of the common femoral vein and including the common femoral, femoral, profunda femoral, popliteal and calf veins including the posterior tibial, peroneal and gastrocnemius veins when visible. The superficial great saphenous vein was also interrogated. Spectral Doppler was utilized to evaluate flow at rest and with distal augmentation maneuvers in the common femoral, femoral and popliteal veins.  COMPARISON: None.  FINDINGS: Contralateral Common Femoral Vein: Respiratory phasicity is normal and symmetric with the symptomatic side. No evidence of thrombus. Normal compressibility.  Common Femoral Vein: No evidence of thrombus. Normal compressibility, respiratory phasicity and response to augmentation.  Saphenofemoral Junction: No evidence of thrombus. Normal compressibility and flow on color Doppler imaging.  Profunda Femoral Vein: No evidence of thrombus. Normal compressibility and flow on color Doppler imaging.  Femoral Vein: No evidence of thrombus. Normal compressibility, respiratory phasicity and response to augmentation.  Popliteal Vein: No evidence of thrombus. Normal compressibility, respiratory phasicity and response to augmentation.  Calf Veins: No evidence of thrombus. Normal compressibility and flow on color Doppler imaging.  Superficial Great Saphenous Vein: No evidence of thrombus. Normal compressibility and flow on color Doppler imaging.  Venous Reflux: None.  Other Findings: None.  IMPRESSION: No evidence of deep venous thrombosis in the left lower extremity.   Electronically Signed By: Delbert Phenix M.D. On: 12/29/2015 08:47           * I  personally reviewed the radiologic studies     ED Course: * Patient's stay here showed some symptomatic improvement she was given steroids along with colchicine and Toradol here in emergency department. Doppler study was negative for deep venous thrombosis and x-ray showed no obvious bony lytic lesions. Patient has a history of gout and will be treated on an outpatient basis with prescription for indomethacin, colchicine, and steroids. Patient's been on a similar regimen before though the family stated they had difficulty affording the colchicine and there were given advice on local palpation areas where they can get the prescription filled.    Assessment: Gouty arthritis     Plan:  Outpatient Patient was advised to return immediately if condition worsens. Patient was advised to follow up with their primary care physician or other specialized physicians involved in their outpatient care. The patient and/or family member/power of attorney had laboratory results reviewed at the bedside. All questions and concerns were addressed and appropriate discharge instructions were distributed by the nursing staff.             Jennye Moccasin, MD 12/29/15 [phone]

## 2015-12-29 NOTE — ED Notes (Signed)
Family at bedside. 

## 2015-12-29 NOTE — ED Notes (Signed)
Pt arrives via EMS from home with complaints of bilateral leg pain all night, states increased swelling in her knees, states hx of gout, pt awake and alert upon arrival, edema 1+ in lower ext

## 2015-12-29 NOTE — ED Notes (Signed)
Pt cleaned and changed upon arrival

## 2016-01-06 DIAGNOSIS — M25473 Effusion, unspecified ankle: Secondary | ICD-10-CM | POA: Diagnosis not present

## 2016-01-10 DIAGNOSIS — D631 Anemia in chronic kidney disease: Secondary | ICD-10-CM | POA: Diagnosis not present

## 2016-01-10 DIAGNOSIS — N184 Chronic kidney disease, stage 4 (severe): Secondary | ICD-10-CM | POA: Diagnosis not present

## 2016-01-10 DIAGNOSIS — I129 Hypertensive chronic kidney disease with stage 1 through stage 4 chronic kidney disease, or unspecified chronic kidney disease: Secondary | ICD-10-CM | POA: Diagnosis not present

## 2016-01-10 DIAGNOSIS — N2581 Secondary hyperparathyroidism of renal origin: Secondary | ICD-10-CM | POA: Diagnosis not present

## 2016-01-10 DIAGNOSIS — M109 Gout, unspecified: Secondary | ICD-10-CM | POA: Diagnosis not present

## 2016-01-10 DIAGNOSIS — E1122 Type 2 diabetes mellitus with diabetic chronic kidney disease: Secondary | ICD-10-CM | POA: Diagnosis not present

## 2016-01-15 DIAGNOSIS — E538 Deficiency of other specified B group vitamins: Secondary | ICD-10-CM | POA: Diagnosis not present

## 2016-01-28 DIAGNOSIS — R195 Other fecal abnormalities: Secondary | ICD-10-CM | POA: Diagnosis not present

## 2016-02-17 DIAGNOSIS — E538 Deficiency of other specified B group vitamins: Secondary | ICD-10-CM | POA: Diagnosis not present

## 2016-03-12 DIAGNOSIS — I129 Hypertensive chronic kidney disease with stage 1 through stage 4 chronic kidney disease, or unspecified chronic kidney disease: Secondary | ICD-10-CM | POA: Diagnosis not present

## 2016-03-12 DIAGNOSIS — N2581 Secondary hyperparathyroidism of renal origin: Secondary | ICD-10-CM | POA: Diagnosis not present

## 2016-03-12 DIAGNOSIS — M109 Gout, unspecified: Secondary | ICD-10-CM | POA: Diagnosis not present

## 2016-03-12 DIAGNOSIS — N184 Chronic kidney disease, stage 4 (severe): Secondary | ICD-10-CM | POA: Diagnosis not present

## 2016-03-12 DIAGNOSIS — D631 Anemia in chronic kidney disease: Secondary | ICD-10-CM | POA: Diagnosis not present

## 2016-03-15 ENCOUNTER — Emergency Department: Payer: Medicare Other

## 2016-03-15 ENCOUNTER — Emergency Department
Admission: EM | Admit: 2016-03-15 | Discharge: 2016-03-15 | Disposition: A | Discharge status: Home / Self Care | Payer: Medicare Other | Attending: Emergency Medicine | Admitting: Emergency Medicine

## 2016-03-15 ENCOUNTER — Encounter: Payer: Self-pay | Admitting: Emergency Medicine

## 2016-03-15 DIAGNOSIS — Y9301 Activity, walking, marching and hiking: Secondary | ICD-10-CM | POA: Insufficient documentation

## 2016-03-15 DIAGNOSIS — S8991XA Unspecified injury of right lower leg, initial encounter: Secondary | ICD-10-CM | POA: Diagnosis not present

## 2016-03-15 DIAGNOSIS — M25561 Pain in right knee: Secondary | ICD-10-CM | POA: Diagnosis not present

## 2016-03-15 DIAGNOSIS — S20211A Contusion of right front wall of thorax, initial encounter: Secondary | ICD-10-CM | POA: Insufficient documentation

## 2016-03-15 DIAGNOSIS — R42 Dizziness and giddiness: Secondary | ICD-10-CM | POA: Insufficient documentation

## 2016-03-15 DIAGNOSIS — Y9289 Other specified places as the place of occurrence of the external cause: Secondary | ICD-10-CM | POA: Diagnosis not present

## 2016-03-15 DIAGNOSIS — N189 Chronic kidney disease, unspecified: Secondary | ICD-10-CM | POA: Insufficient documentation

## 2016-03-15 DIAGNOSIS — N390 Urinary tract infection, site not specified: Secondary | ICD-10-CM | POA: Insufficient documentation

## 2016-03-15 DIAGNOSIS — Y999 Unspecified external cause status: Secondary | ICD-10-CM | POA: Diagnosis not present

## 2016-03-15 DIAGNOSIS — W19XXXA Unspecified fall, initial encounter: Secondary | ICD-10-CM | POA: Insufficient documentation

## 2016-03-15 DIAGNOSIS — I129 Hypertensive chronic kidney disease with stage 1 through stage 4 chronic kidney disease, or unspecified chronic kidney disease: Secondary | ICD-10-CM | POA: Insufficient documentation

## 2016-03-15 DIAGNOSIS — S0990XA Unspecified injury of head, initial encounter: Secondary | ICD-10-CM | POA: Diagnosis not present

## 2016-03-15 DIAGNOSIS — R079 Chest pain, unspecified: Secondary | ICD-10-CM | POA: Diagnosis not present

## 2016-03-15 DIAGNOSIS — S299XXA Unspecified injury of thorax, initial encounter: Secondary | ICD-10-CM | POA: Diagnosis not present

## 2016-03-15 LAB — CBC WITH DIFFERENTIAL/PLATELET
BASOS ABS: 0.1 10*3/uL (ref 0–0.1)
Basophils Relative: 1 %
EOS ABS: 0.6 10*3/uL (ref 0–0.7)
EOS PCT: 6 %
HCT: 29.7 % — ABNORMAL LOW (ref 35.0–47.0)
Hemoglobin: 9.8 g/dL — ABNORMAL LOW (ref 12.0–16.0)
LYMPHS PCT: 15 %
Lymphs Abs: 1.4 10*3/uL (ref 1.0–3.6)
MCH: 30 pg (ref 26.0–34.0)
MCHC: 32.8 g/dL (ref 32.0–36.0)
MCV: 91.5 fL (ref 80.0–100.0)
MONO ABS: 0.9 10*3/uL (ref 0.2–0.9)
Monocytes Relative: 10 %
Neutro Abs: 6.5 10*3/uL (ref 1.4–6.5)
Neutrophils Relative %: 68 %
PLATELETS: 203 10*3/uL (ref 150–440)
RBC: 3.25 MIL/uL — ABNORMAL LOW (ref 3.80–5.20)
RDW: 16.6 % — AB (ref 11.5–14.5)
WBC: 9.5 10*3/uL (ref 3.6–11.0)

## 2016-03-15 LAB — URINALYSIS COMPLETE WITH MICROSCOPIC (ARMC ONLY)
Bilirubin Urine: NEGATIVE
Glucose, UA: NEGATIVE mg/dL
HGB URINE DIPSTICK: NEGATIVE
Ketones, ur: NEGATIVE mg/dL
NITRITE: NEGATIVE
PROTEIN: 30 mg/dL — AB
SPECIFIC GRAVITY, URINE: 1.011 (ref 1.005–1.030)
pH: 5 (ref 5.0–8.0)

## 2016-03-15 LAB — COMPREHENSIVE METABOLIC PANEL
ALT: 6 U/L — ABNORMAL LOW (ref 14–54)
AST: 17 U/L (ref 15–41)
Albumin: 3.4 g/dL — ABNORMAL LOW (ref 3.5–5.0)
Alkaline Phosphatase: 57 U/L (ref 38–126)
Anion gap: 5 (ref 5–15)
BUN: 45 mg/dL — AB (ref 6–20)
CHLORIDE: 109 mmol/L (ref 101–111)
CO2: 24 mmol/L (ref 22–32)
Calcium: 8.9 mg/dL (ref 8.9–10.3)
Creatinine, Ser: 2.4 mg/dL — ABNORMAL HIGH (ref 0.44–1.00)
GFR, EST AFRICAN AMERICAN: 19 mL/min — AB (ref >60)
GFR, EST NON AFRICAN AMERICAN: 17 mL/min — AB (ref >60)
Glucose, Bld: 104 mg/dL — ABNORMAL HIGH (ref 65–99)
POTASSIUM: 5.2 mmol/L — AB (ref 3.5–5.1)
SODIUM: 138 mmol/L (ref 135–145)
Total Bilirubin: 0.6 mg/dL (ref 0.3–1.2)
Total Protein: 7 g/dL (ref 6.5–8.1)

## 2016-03-15 LAB — BASIC METABOLIC PANEL
Anion gap: 7 (ref 5–15)
BUN: 43 mg/dL — AB (ref 6–20)
CALCIUM: 8.8 mg/dL — AB (ref 8.9–10.3)
CO2: 22 mmol/L (ref 22–32)
CREATININE: 2.22 mg/dL — AB (ref 0.44–1.00)
Chloride: 110 mmol/L (ref 101–111)
GFR calc Af Amer: 21 mL/min — ABNORMAL LOW (ref >60)
GFR, EST NON AFRICAN AMERICAN: 18 mL/min — AB (ref >60)
Glucose, Bld: 155 mg/dL — ABNORMAL HIGH (ref 65–99)
Potassium: 5.1 mmol/L (ref 3.5–5.1)
SODIUM: 139 mmol/L (ref 135–145)

## 2016-03-15 MED ORDER — CEPHALEXIN 250 MG PO CAPS: 250.0000 mg | ORAL_CAPSULE | Freq: Two times a day (BID) | ORAL | 0 refills | Status: AC

## 2016-03-15 MED ORDER — TRAMADOL HCL 50 MG PO TABS: 50.0000 mg | ORAL_TABLET | Freq: Four times a day (QID) | ORAL | 0 refills | Status: DC | PRN

## 2016-03-15 MED ORDER — SODIUM CHLORIDE 0.9 % IV SOLN
Freq: Once | INTRAVENOUS | Status: AC
  Administered 2016-03-15: 15:00:00 via INTRAVENOUS

## 2016-03-15 NOTE — ED Notes (Signed)

## 2016-03-15 NOTE — ED Triage Notes (Signed)
Pt walking into church, tip of cane broke and pt lost her balance falling on her right side. Pt c/o right rib and knee pain and possibly the side of her face. Denies LOC.

## 2016-03-15 NOTE — ED Triage Notes (Signed)
[place] [employer] c/o rt side pain and rt knee. htn in triage - her bp med was lowered on thurs

## 2016-03-15 NOTE — ED Notes (Signed)
Patient transported to CT/XR ?

## 2016-03-15 NOTE — ED Provider Notes (Signed)
[facility] Emergency Department Provider Note        Time seen: ----------------------------------------- 2:08 PM on 03/15/2016 -----------------------------------------    I have reviewed the triage vital signs and the nursing notes.   HISTORY  Chief Complaint Fall    HPI Catherine Anderson is a 82 y.o. female who presents to the ER after she fell at church. Patient had been feeling lightheaded, states she hit her right side and right knee on church furniture. Patient recently had a blood pressure medication lowered as this was thought to be the reason for her dizziness. Patient states today she fell because the tip of her cane broke and she lost her balance. She denies loss of consciousness, denies fevers chills or other complaints.   Past Medical History:  Diagnosis Date  . Gout   . Hypertension   . Thyroid disease     There are no active problems to display for this patient.   History reviewed. No pertinent surgical history.  Allergies Review of patient's allergies indicates no known allergies.  Social History Social History  Substance Use Topics  . Smoking status: Never Smoker  . Smokeless tobacco: Never Used  . Alcohol use No    Review of Systems Constitutional: Negative for fever. Cardiovascular: Negative for chest pain. Respiratory: Negative for shortness of breath. Gastrointestinal: Negative for abdominal pain, vomiting and diarrhea. Genitourinary: Negative for dysuria. Musculoskeletal: Negative for back pain.Positive for right-sided rib pain, right knee pain Skin: Negative for rash. Neurological: Negative for headaches, focal weakness or numbness. Positive for dizziness  10-point ROS otherwise negative.  ____________________________________________   PHYSICAL EXAM:  VITAL SIGNS: ED Triage Vitals  Enc Vitals Group     BP 03/15/16 1316 (!) 196/83     Pulse Rate 03/15/16 1316 (!) 56     Resp 03/15/16 1316 18     Temp  03/15/16 1316 98 F (36.7 C)     Temp Source 03/15/16 1316 Oral     SpO2 03/15/16 1316 98 %     Weight 03/15/16 1318 114 lb (51.7 kg)     Height 03/15/16 1318 5\' 3"  (1.6 m)     Head Circumference --      Peak Flow --      Pain Score 03/15/16 1320 10     Pain Loc --      Pain Edu? --      Excl. in GC? --     Constitutional: Alert and oriented. Well appearing and in no distress. Eyes: Conjunctivae are normal. PERRL. Normal extraocular movements. ENT   Head: Normocephalic and atraumatic.   Nose: No congestion/rhinnorhea.   Mouth/Throat: Mucous membranes are moist.   Neck: No stridor. Cardiovascular: Slow rate, regular rhythm. No murmurs, rubs, or gallops. Respiratory: Normal respiratory effort without tachypnea nor retractions. Breath sounds are clear and equal bilaterally. No wheezes/rales/rhonchi. Gastrointestinal: Soft and nontender. Normal bowel sounds Musculoskeletal: Nontender with normal range of motion in all extremities. Mild right knee tenderness, right axillary rib tenderness Neurologic:  Normal speech and language. No gross focal neurologic deficits are appreciated.  Skin:  Skin is warm, dry and intact. No rash noted. Psychiatric: Mood and affect are normal. Speech and behavior are normal.  ____________________________________________  ED COURSE:  Pertinent labs & imaging results that were available during my care of the patient were reviewed by me and considered in my medical decision making (see chart for details). Clinical Course  Patient presents to ER with weakness and fall. We will assess  with basic labs and imaging.  Procedures ____________________________________________   LABS (pertinent positives/negatives)  Labs Reviewed  CBC WITH DIFFERENTIAL/PLATELET - Abnormal; Notable for the following:       Result Value   RBC 3.25 (*)    Hemoglobin 9.8 (*)    HCT 29.7 (*)    RDW 16.6 (*)    All other components within normal limits  COMPREHENSIVE  METABOLIC PANEL - Abnormal; Notable for the following:    Potassium 5.2 (*)    Glucose, Bld 104 (*)    BUN 45 (*)    Creatinine, Ser 2.40 (*)    Albumin 3.4 (*)    ALT 6 (*)    GFR calc non Af Amer 17 (*)    GFR calc Af Amer 19 (*)    All other components within normal limits  URINALYSIS COMPLETEWITH MICROSCOPIC ([facility] ONLY) - Abnormal; Notable for the following:    Color, Urine YELLOW (*)    APPearance HAZY (*)    Protein, ur 30 (*)    Leukocytes, UA 1+ (*)    Bacteria, UA MANY (*)    Squamous Epithelial / LPF 0-5 (*)    All other components within normal limits  BASIC METABOLIC PANEL - Abnormal; Notable for the following:    Glucose, Bld 155 (*)    BUN 43 (*)    Creatinine, Ser 2.22 (*)    Calcium 8.8 (*)    GFR calc non Af Amer 18 (*)    GFR calc Af Amer 21 (*)    All other components within normal limits    RADIOLOGY Images were viewed by me  CT head, right rib x-rays, right knee x-rays. IMPRESSION: No fracture or dislocation. Joint effusion present. There is a degree of osteoarthritic change with chondrocalcinosis. Chondrocalcinosis may be seen with osteoarthritis but also may be seen with calcium pyrophosphate deposition disease. There are foci of arterial atherosclerosis. Bones appear somewhat osteoporotic. IMPRESSION: Old third rib fracture. No acute rib fracture is evident. Patient has had previous kyphoplasty procedure at L1. There is anterior wedging of several lower thoracic vertebral bodies, age uncertain. No pneumothorax or pleural effusion. Evidence a degree of underlying congestive heart failure. There is aortic atherosclerosis. IMPRESSION: No evidence of acute intracranial abnormality.  Atrophy with small vessel ischemic changes.   ____________________________________________  FINAL ASSESSMENT AND PLAN  Fall, weakness, chronic kidney disease, UTI  Plan: Patient with labs and imaging as dictated above. Patient is in no distress, labs  appear improved after some saline here. She does have a UTI and was placed on Keflex. She is stable for outpatient follow-up with her doctor.   Emily Filbert, MD   Note: This dictation was prepared with Dragon dictation. Any transcriptional errors that result from this process are unintentional    Emily Filbert, MD 03/15/16 1733

## 2016-03-24 DIAGNOSIS — S20211D Contusion of right front wall of thorax, subsequent encounter: Secondary | ICD-10-CM | POA: Diagnosis not present

## 2016-03-24 DIAGNOSIS — M810 Age-related osteoporosis without current pathological fracture: Secondary | ICD-10-CM | POA: Diagnosis not present

## 2016-03-24 DIAGNOSIS — M1990 Unspecified osteoarthritis, unspecified site: Secondary | ICD-10-CM | POA: Diagnosis not present

## 2016-03-24 DIAGNOSIS — E538 Deficiency of other specified B group vitamins: Secondary | ICD-10-CM | POA: Diagnosis not present

## 2016-03-24 DIAGNOSIS — I10 Essential (primary) hypertension: Secondary | ICD-10-CM | POA: Diagnosis not present

## 2016-03-24 DIAGNOSIS — D631 Anemia in chronic kidney disease: Secondary | ICD-10-CM | POA: Diagnosis not present

## 2016-03-24 DIAGNOSIS — R2689 Other abnormalities of gait and mobility: Secondary | ICD-10-CM | POA: Diagnosis not present

## 2016-03-24 DIAGNOSIS — Z23 Encounter for immunization: Secondary | ICD-10-CM | POA: Diagnosis not present

## 2016-03-24 DIAGNOSIS — N184 Chronic kidney disease, stage 4 (severe): Secondary | ICD-10-CM | POA: Diagnosis not present

## 2016-03-24 DIAGNOSIS — N183 Chronic kidney disease, stage 3 (moderate): Secondary | ICD-10-CM | POA: Diagnosis not present

## 2016-03-31 ENCOUNTER — Emergency Department: Payer: Medicare Other

## 2016-03-31 ENCOUNTER — Ambulatory Visit: Payer: Medicare Other | Admitting: Physical Therapy

## 2016-03-31 ENCOUNTER — Inpatient Hospital Stay
Admission: EM | Admit: 2016-03-31 | Discharge: 2016-04-03 | DRG: 308 | Disposition: A | Discharge status: Skilled Nursing Facility | Payer: Medicare Other | Attending: Internal Medicine | Admitting: Internal Medicine

## 2016-03-31 DIAGNOSIS — I447 Left bundle-branch block, unspecified: Secondary | ICD-10-CM | POA: Diagnosis present

## 2016-03-31 DIAGNOSIS — H9190 Unspecified hearing loss, unspecified ear: Secondary | ICD-10-CM | POA: Diagnosis present

## 2016-03-31 DIAGNOSIS — I10 Essential (primary) hypertension: Secondary | ICD-10-CM | POA: Diagnosis not present

## 2016-03-31 DIAGNOSIS — I248 Other forms of acute ischemic heart disease: Secondary | ICD-10-CM | POA: Diagnosis present

## 2016-03-31 DIAGNOSIS — R531 Weakness: Secondary | ICD-10-CM

## 2016-03-31 DIAGNOSIS — M6281 Muscle weakness (generalized): Secondary | ICD-10-CM

## 2016-03-31 DIAGNOSIS — E785 Hyperlipidemia, unspecified: Secondary | ICD-10-CM | POA: Diagnosis present

## 2016-03-31 DIAGNOSIS — R000 Tachycardia, unspecified: Secondary | ICD-10-CM | POA: Diagnosis not present

## 2016-03-31 DIAGNOSIS — N184 Chronic kidney disease, stage 4 (severe): Secondary | ICD-10-CM | POA: Diagnosis present

## 2016-03-31 DIAGNOSIS — Z8249 Family history of ischemic heart disease and other diseases of the circulatory system: Secondary | ICD-10-CM | POA: Diagnosis not present

## 2016-03-31 DIAGNOSIS — I081 Rheumatic disorders of both mitral and tricuspid valves: Secondary | ICD-10-CM | POA: Diagnosis present

## 2016-03-31 DIAGNOSIS — M109 Gout, unspecified: Secondary | ICD-10-CM | POA: Diagnosis present

## 2016-03-31 DIAGNOSIS — R7989 Other specified abnormal findings of blood chemistry: Secondary | ICD-10-CM | POA: Diagnosis not present

## 2016-03-31 DIAGNOSIS — I482 Chronic atrial fibrillation: Secondary | ICD-10-CM | POA: Diagnosis not present

## 2016-03-31 DIAGNOSIS — E039 Hypothyroidism, unspecified: Secondary | ICD-10-CM | POA: Diagnosis present

## 2016-03-31 DIAGNOSIS — E43 Unspecified severe protein-calorie malnutrition: Secondary | ICD-10-CM | POA: Diagnosis present

## 2016-03-31 DIAGNOSIS — R262 Difficulty in walking, not elsewhere classified: Secondary | ICD-10-CM

## 2016-03-31 DIAGNOSIS — D631 Anemia in chronic kidney disease: Secondary | ICD-10-CM | POA: Diagnosis present

## 2016-03-31 DIAGNOSIS — R55 Syncope and collapse: Secondary | ICD-10-CM | POA: Diagnosis not present

## 2016-03-31 DIAGNOSIS — I4891 Unspecified atrial fibrillation: Secondary | ICD-10-CM | POA: Diagnosis present

## 2016-03-31 DIAGNOSIS — S299XXA Unspecified injury of thorax, initial encounter: Secondary | ICD-10-CM | POA: Diagnosis not present

## 2016-03-31 DIAGNOSIS — Z9181 History of falling: Secondary | ICD-10-CM

## 2016-03-31 DIAGNOSIS — I1310 Hypertensive heart and chronic kidney disease without heart failure, with stage 1 through stage 4 chronic kidney disease, or unspecified chronic kidney disease: Secondary | ICD-10-CM | POA: Diagnosis present

## 2016-03-31 DIAGNOSIS — I480 Paroxysmal atrial fibrillation: Secondary | ICD-10-CM | POA: Diagnosis not present

## 2016-03-31 DIAGNOSIS — Z6820 Body mass index (BMI) 20.0-20.9, adult: Secondary | ICD-10-CM | POA: Diagnosis not present

## 2016-03-31 DIAGNOSIS — I083 Combined rheumatic disorders of mitral, aortic and tricuspid valves: Secondary | ICD-10-CM | POA: Diagnosis not present

## 2016-03-31 HISTORY — DX: Hyperlipidemia, unspecified: E78.5

## 2016-03-31 HISTORY — DX: Personal history of other diseases of the digestive system: Z87.19

## 2016-03-31 HISTORY — DX: Chronic kidney disease, stage 4 (severe): N18.4

## 2016-03-31 LAB — COMPREHENSIVE METABOLIC PANEL
ALBUMIN: 2.9 g/dL — AB (ref 3.5–5.0)
ALT: 11 U/L — ABNORMAL LOW (ref 14–54)
ANION GAP: 11 (ref 5–15)
AST: 24 U/L (ref 15–41)
Alkaline Phosphatase: 75 U/L (ref 38–126)
BILIRUBIN TOTAL: 0.2 mg/dL — AB (ref 0.3–1.2)
BUN: 44 mg/dL — AB (ref 6–20)
CHLORIDE: 105 mmol/L (ref 101–111)
CO2: 20 mmol/L — ABNORMAL LOW (ref 22–32)
Calcium: 8.7 mg/dL — ABNORMAL LOW (ref 8.9–10.3)
Creatinine, Ser: 2.61 mg/dL — ABNORMAL HIGH (ref 0.44–1.00)
GFR calc Af Amer: 17 mL/min — ABNORMAL LOW (ref >60)
GFR calc non Af Amer: 15 mL/min — ABNORMAL LOW (ref >60)
GLUCOSE: 190 mg/dL — AB (ref 65–99)
POTASSIUM: 3.8 mmol/L (ref 3.5–5.1)
Sodium: 136 mmol/L (ref 135–145)
TOTAL PROTEIN: 7.2 g/dL (ref 6.5–8.1)

## 2016-03-31 LAB — TSH: TSH: 11.61 u[IU]/mL — ABNORMAL HIGH (ref 0.350–4.500)

## 2016-03-31 LAB — CBC WITH DIFFERENTIAL/PLATELET
BASOS ABS: 0.1 10*3/uL (ref 0–0.1)
BASOS PCT: 1 %
EOS ABS: 0.4 10*3/uL (ref 0–0.7)
EOS PCT: 3 %
HEMATOCRIT: 27.7 % — AB (ref 35.0–47.0)
Hemoglobin: 9.4 g/dL — ABNORMAL LOW (ref 12.0–16.0)
Lymphocytes Relative: 8 %
Lymphs Abs: 1 10*3/uL (ref 1.0–3.6)
MCH: 30.3 pg (ref 26.0–34.0)
MCHC: 33.9 g/dL (ref 32.0–36.0)
MCV: 89.4 fL (ref 80.0–100.0)
MONO ABS: 0.8 10*3/uL (ref 0.2–0.9)
MONOS PCT: 6 %
Neutro Abs: 10.7 10*3/uL — ABNORMAL HIGH (ref 1.4–6.5)
Neutrophils Relative %: 82 %
PLATELETS: 311 10*3/uL (ref 150–440)
RBC: 3.1 MIL/uL — ABNORMAL LOW (ref 3.80–5.20)
RDW: 15.9 % — AB (ref 11.5–14.5)
WBC: 12.9 10*3/uL — ABNORMAL HIGH (ref 3.6–11.0)

## 2016-03-31 LAB — TROPONIN I: TROPONIN I: 0.12 ng/mL — AB (ref <0.03)

## 2016-03-31 LAB — BRAIN NATRIURETIC PEPTIDE: B NATRIURETIC PEPTIDE 5: 2634 pg/mL — AB (ref 0.0–100.0)

## 2016-03-31 LAB — T4, FREE: FREE T4: 0.87 ng/dL (ref 0.61–1.12)

## 2016-03-31 MED ORDER — ACETAMINOPHEN 325 MG PO TABS: 650.0000 mg | ORAL_TABLET | Freq: Four times a day (QID) | ORAL | Status: DC | PRN

## 2016-03-31 MED ORDER — DILTIAZEM HCL 100 MG IV SOLR
5.0000 mg/h | INTRAVENOUS | Status: AC
  Administered 2016-03-31: 12.5 mg/h via INTRAVENOUS
  Administered 2016-03-31: 5 mg/h via INTRAVENOUS
  Administered 2016-04-01: 15 mg/h via INTRAVENOUS
  Filled 2016-03-31 (×2): qty 100

## 2016-03-31 MED ORDER — DILTIAZEM HCL 25 MG/5ML IV SOLN
10.0000 mg | Freq: Once | INTRAVENOUS | Status: AC
  Administered 2016-03-31: 10 mg via INTRAVENOUS

## 2016-03-31 MED ORDER — ACETAMINOPHEN 650 MG RE SUPP: 650.0000 mg | Freq: Four times a day (QID) | RECTAL | Status: DC | PRN

## 2016-03-31 MED ORDER — COLCHICINE 0.6 MG PO TABS
0.6000 mg | ORAL_TABLET | Freq: Two times a day (BID) | ORAL | Status: DC
Start: 2016-03-31 — End: 2016-04-01
  Administered 2016-04-01: 0.6 mg via ORAL
  Filled 2016-03-31: qty 1

## 2016-03-31 MED ORDER — SODIUM CHLORIDE 0.9 % IV BOLUS (SEPSIS)
1000.0000 mL | Freq: Once | INTRAVENOUS | Status: AC
  Administered 2016-03-31: 1000 mL via INTRAVENOUS

## 2016-03-31 MED ORDER — HEPARIN SODIUM (PORCINE) 5000 UNIT/ML IJ SOLN
5000.0000 [IU] | Freq: Three times a day (TID) | INTRAMUSCULAR | Status: DC
  Administered 2016-04-01 – 2016-04-03 (×6): 5000 [IU] via SUBCUTANEOUS
  Filled 2016-03-31 (×7): qty 1

## 2016-03-31 MED ORDER — ONDANSETRON HCL 4 MG/2ML IJ SOLN: 4.0000 mg | Freq: Four times a day (QID) | INTRAMUSCULAR | Status: DC | PRN

## 2016-03-31 MED ORDER — ONDANSETRON HCL 4 MG PO TABS: 4.0000 mg | ORAL_TABLET | Freq: Four times a day (QID) | ORAL | Status: DC | PRN

## 2016-03-31 MED ORDER — SODIUM CHLORIDE 0.9% FLUSH
3.0000 mL | Freq: Two times a day (BID) | INTRAVENOUS | Status: DC
  Administered 2016-04-01 – 2016-04-03 (×5): 3 mL via INTRAVENOUS

## 2016-03-31 MED ORDER — DILTIAZEM LOAD VIA INFUSION
10.0000 mg | Freq: Once | INTRAVENOUS | Status: DC
  Filled 2016-03-31: qty 10

## 2016-03-31 MED ORDER — TRAMADOL HCL 50 MG PO TABS: 50.0000 mg | ORAL_TABLET | Freq: Two times a day (BID) | ORAL | Status: DC | PRN

## 2016-03-31 MED ORDER — LEVOTHYROXINE SODIUM 50 MCG PO TABS
50.0000 ug | ORAL_TABLET | Freq: Every day | ORAL | Status: DC
  Administered 2016-04-01: 50 ug via ORAL
  Filled 2016-03-31: qty 1

## 2016-03-31 NOTE — H&P (Signed)
[facility] - [place] [facility]   PATIENT NAME: Catherine Anderson    MR#:  [id number]  DATE OF BIRTH:  [birth year]  DATE OF ADMISSION:  03/31/2016  PRIMARY CARE PHYSICIAN: Mickey Farber, MD   REQUESTING/REFERRING PHYSICIAN: Inocencio Homes, MD  CHIEF COMPLAINT:   Chief Complaint  Patient presents with  . Fall    HISTORY OF PRESENT ILLNESS:  Catherine Anderson  is a 82 y.o. female who presents with Multiple recent falls. She is brought in by her son tonight at request of her PCP for evaluation of the same. She was found to be in A. fib with RVR. This is a new diagnosis for her. She did not respond initially to rate controlling medications, and was placed on a Cardizem drip some response. Hospitals were called for admission.  PAST MEDICAL HISTORY:   Past Medical History:  Diagnosis Date  . CKD (chronic kidney disease), stage IV (HCC)   . Gout   . History of GI bleed   . HLD (hyperlipidemia)   . Hypertension   . Thyroid disease     PAST SURGICAL HISTORY:   Past Surgical History:  Procedure Laterality Date  . BREAST SURGERY      SOCIAL HISTORY:   Social History  Substance Use Topics  . Smoking status: Never Smoker  . Smokeless tobacco: Never Used  . Alcohol use No    FAMILY HISTORY:   Family History  Problem Relation Age of Onset  . Aortic aneurysm Mother   . Gout Brother   . Heart disease Brother     DRUG ALLERGIES:  No Known Allergies  MEDICATIONS AT HOME:   Prior to Admission medications   Medication Sig Start Date End Date Taking? Authorizing Provider  colchicine 0.6 MG tablet Take 1 tablet (0.6 mg total) by mouth 2 (two) times daily. 12/29/15 01/04/17  Jennye Moccasin, MD  traMADol (ULTRAM) 50 MG tablet Take 1 tablet (50 mg total) by mouth every 6 (six) hours as needed. 03/15/16 03/15/17  Emily Filbert, MD    REVIEW OF SYSTEMS:  Review of Systems  Constitutional: Negative for chills, fever, malaise/fatigue and weight loss.  HENT:  Negative for ear pain, hearing loss and tinnitus.   Eyes: Negative for blurred vision, double vision, pain and redness.  Respiratory: Negative for cough, hemoptysis and shortness of breath.   Cardiovascular: Positive for palpitations. Negative for chest pain, orthopnea and leg swelling.  Gastrointestinal: Negative for abdominal pain, constipation, diarrhea, nausea and vomiting.  Genitourinary: Negative for dysuria, frequency and hematuria.  Musculoskeletal: Positive for falls. Negative for back pain, joint pain and neck pain.  Skin:       No acne, rash, or lesions  Neurological: Negative for dizziness, tremors, focal weakness and weakness.  Endo/Heme/Allergies: Negative for polydipsia. Does not bruise/bleed easily.  Psychiatric/Behavioral: Negative for depression. The patient is not nervous/anxious and does not have insomnia.      VITAL SIGNS:   Vitals:   03/31/16 2052 03/31/16 2100 03/31/16 2105 03/31/16 2130  BP: 112/82 112/86 107/83 (!) 129/98  Pulse:    (!) 144  Resp: (!) 31 (!) 26 (!) 24   Temp:      TempSrc:      SpO2:    99%  Weight:      Height:       Wt Readings from Last 3 Encounters:  03/31/16 51.7 kg (114 lb)  03/15/16 51.7 kg (114 lb)  12/29/15 51.7 kg (114 lb)  PHYSICAL EXAMINATION:  Physical Exam  Vitals reviewed. Constitutional: She is oriented to person, place, and time. She appears well-developed and well-nourished. No distress.  HENT:  Head: Normocephalic and atraumatic.  Mouth/Throat: Oropharynx is clear and moist.  Eyes: Conjunctivae and EOM are normal. Pupils are equal, round, and reactive to light. No scleral icterus.  Neck: Normal range of motion. Neck supple. No JVD present. No thyromegaly present.  Cardiovascular: Intact distal pulses.  Exam reveals no gallop and no friction rub.   No murmur heard. Tachycardic, irregular rhythm  Respiratory: Effort normal and breath sounds normal. No respiratory distress. She has no wheezes. She has no rales.   GI: Soft. Bowel sounds are normal. She exhibits no distension. There is no tenderness.  Musculoskeletal: Normal range of motion. She exhibits no edema.  No arthritis, no gout  Lymphadenopathy:    She has no cervical adenopathy.  Neurological: She is alert and oriented to person, place, and time. No cranial nerve deficit.  No dysarthria, no aphasia  Skin: Skin is warm and dry. No rash noted. No erythema.  Multistage ecchymosis around her left eye  Psychiatric: She has a normal mood and affect. Her behavior is normal. Judgment and thought content normal.    LABORATORY PANEL:   CBC  Recent Labs Lab 03/31/16 2040  WBC 12.9*  HGB 9.4*  HCT 27.7*  PLT 311   ------------------------------------------------------------------------------------------------------------------  Chemistries   Recent Labs Lab 03/31/16 2040  NA 136  K 3.8  CL 105  CO2 20*  GLUCOSE 190*  BUN 44*  CREATININE 2.61*  CALCIUM 8.7*  AST 24  ALT 11*  ALKPHOS 75  BILITOT 0.2*   ------------------------------------------------------------------------------------------------------------------  Cardiac Enzymes  Recent Labs Lab 03/31/16 2040  TROPONINI 0.12*   ------------------------------------------------------------------------------------------------------------------  RADIOLOGY:  Dg Chest Portable 1 View  Result Date: 03/31/2016 CLINICAL DATA:  Status post fall, with concern for chest injury. Initial encounter. EXAM: PORTABLE CHEST 1 VIEW COMPARISON:  Chest radiograph from 03/15/2016 FINDINGS: The lungs are well-aerated. Vascular congestion is noted. Increased interstitial markings raise concern for pulmonary edema. There is no evidence of pleural effusion or pneumothorax. The cardiomediastinal silhouette is enlarged. No acute osseous abnormalities are seen. IMPRESSION: Vascular congestion and cardiomegaly. Increased interstitial markings raise concern for pulmonary edema. No displaced rib  fracture seen. Electronically Signed   By: Roanna RaiderJeffery  Chang M.D.   On: 03/31/2016 21:27    EKG:   Orders placed or performed during the hospital encounter of 03/31/16  . ED EKG  . ED EKG  . EKG 12-Lead  . EKG 12-Lead    IMPRESSION AND PLAN:  Principal Problem:   Atrial fibrillation with RVR (HCC) - new diagnosis for this patient. She has been started on a Cardizem drip with moderate response and her rate. This will likely need to be titrated up somewhat in order to achieve rate control. Goal heart rate is less than 110. Cardiac enzyme was initially mildly elevated at 0.12. We will trend his enzymes serially tonight. Cardiology consult with echocardiogram ordered for the morning. Active Problems:   HTN (hypertension) - currently stable, hold other antihypertensives for now she is on Cardizem IV. Other antihypertensives can be used if needed.   CKD (chronic kidney disease), stage IV (HCC) - avoid nephrotoxic medications, creatinine is at baseline.   Hypothyroidism - home dose thyroid replacement  Of note, patient's family states that she has been taking an antibiotic for urinary infection, though they are not sure what the medication is or what dose.  Son states he will bring this in tomorrow for reconciliation so that it may be ordered.  All the records are reviewed and case discussed with ED provider. Management plans discussed with the patient and/or family.  DVT PROPHYLAXIS: SubQ heparin  GI PROPHYLAXIS: None  ADMISSION STATUS: Inpatient  CODE STATUS: Full Code Status History    This patient does not have a recorded code status. Please follow your organizational policy for patients in this situation.      TOTAL TIME TAKING CARE OF THIS PATIENT: 45 minutes.    Catherine Anderson 03/31/2016, 10:41 PM  [facility]  Office  [phone]  CC: Primary care physician; Mickey Farber, MD

## 2016-03-31 NOTE — ED Notes (Signed)
Pt denies any pain at this time.

## 2016-03-31 NOTE — ED Triage Notes (Signed)
Patient to ED after a fall. Patient's family states she fell in church on Sunday after she got up too quick and "got swimmy headed." Patient presents with a large bruise over left eye in various stages of healing.

## 2016-03-31 NOTE — ED Notes (Signed)
Critical troponin 0.12 thea from lab called. MD made aware.

## 2016-03-31 NOTE — ED Provider Notes (Signed)
[facility] Emergency Department Provider Note   ____________________________________________   First MD Initiated Contact with Patient 03/31/16 2036     (approximate)  I have reviewed the triage vital signs and the nursing notes.   HISTORY  Chief Complaint Fall    HPI Catherine Anderson is a 82 y.o. female with hypertension, gout, thyroid disease who presents for evaluation of generalized weakness and poor appetite today, gradual onset, constant, severe, no modifying factors. Her family became concerned about her and brought her to the emergency department for that reason. Approximately 3 days ago she fell in church after she got up and felt "swimmy headed". She did hit her head at that time she denies any severe headache, no neck pain, no chest pain or difficulty breathing. No recurrent syncopal episodes. She denies any pain or burning with urination.   Past Medical History:  Diagnosis Date  . CKD (chronic kidney disease), stage IV (HCC)   . Gout   . History of GI bleed   . HLD (hyperlipidemia)   . Hypertension   . Thyroid disease     Patient Active Problem List   Diagnosis Date Noted  . Atrial fibrillation with RVR (HCC) 03/31/2016  . HTN (hypertension) 03/31/2016  . HLD (hyperlipidemia) 03/31/2016  . CKD (chronic kidney disease), stage IV (HCC) 03/31/2016  . Hypothyroidism 03/31/2016    Past Surgical History:  Procedure Laterality Date  . BREAST SURGERY      Prior to Admission medications   Medication Sig Start Date End Date Taking? Authorizing Provider  cholecalciferol (VITAMIN D) 1000 units tablet Take 2,000 Units by mouth daily.   Yes Historical Provider, MD  colchicine 0.6 MG tablet Take 1 tablet (0.6 mg total) by mouth 2 (two) times daily. 12/29/15 01/04/17 Yes Jennye MoccasinBrian S Quigley, MD  ferrous sulfate 325 (65 FE) MG tablet Take 325 mg by mouth daily with breakfast.   Yes Historical Provider, MD  levothyroxine (SYNTHROID, LEVOTHROID) 50 MCG  tablet Take 50 mcg by mouth daily before breakfast.   Yes Historical Provider, MD  lisinopril (PRINIVIL,ZESTRIL) 2.5 MG tablet Take 2.5 mg by mouth 2 (two) times daily.   Yes Historical Provider, MD  traMADol (ULTRAM) 50 MG tablet Take 1 tablet (50 mg total) by mouth every 6 (six) hours as needed. 03/15/16 03/15/17 Yes Emily FilbertJonathan E Williams, MD    Allergies Review of patient's allergies indicates no known allergies.  Family History  Problem Relation Age of Onset  . Aortic aneurysm Mother   . Gout Brother   . Heart disease Brother     Social History Social History  Substance Use Topics  . Smoking status: Never Smoker  . Smokeless tobacco: Never Used  . Alcohol use No    Review of Systems Constitutional: No fever/chills Eyes: No visual changes. ENT: No sore throat. Cardiovascular: Denies chest pain. Respiratory: Denies shortness of breath. Gastrointestinal: No abdominal pain.  No nausea, no vomiting.  No diarrhea.  No constipation. Genitourinary: Negative for dysuria. Musculoskeletal: Negative for back pain. Skin: Negative for rash. Neurological: Negative for headaches, focal weakness or numbness.  10-point ROS otherwise negative.  ____________________________________________   PHYSICAL EXAM:  Vitals:   03/31/16 2105 03/31/16 2130 03/31/16 2235 03/31/16 2250  BP: 107/83 (!) 129/98 117/81 (!) 121/97  Pulse:  (!) 144 (!) 129 (!) 137  Resp: (!) 24     Temp:      TempSrc:      SpO2:  99% 99% 98%  Weight:  Height:        VITAL SIGNS: ED Triage Vitals  Enc Vitals Group     BP 03/31/16 2016 119/83     Pulse Rate 03/31/16 2016 (!) 167     Resp 03/31/16 2016 18     Temp 03/31/16 2016 98.8 F (37.1 C)     Temp Source 03/31/16 2016 Oral     SpO2 03/31/16 2016 94 %     Weight 03/31/16 2017 114 lb (51.7 kg)     Height 03/31/16 2017 5\' 3"  (1.6 m)     Head Circumference --      Peak Flow --      Pain Score --      Pain Loc --      Pain Edu? --      Excl. in GC?  --     Constitutional: Alert and oriented. Well appearing and in no acute distress. Eyes: Conjunctivae are normal. PERRL. EOMI. Head: Healing ecchymosis in the left forehead without bony step-off or deformity. Nose: No congestion/rhinnorhea. Mouth/Throat: Mucous membranes are moist.  Oropharynx non-erythematous. Neck: No stridor.  Supple without meningismus. No midline C-spine tenderness to palpation. Cardiovascular: tachycardic rate, irregular rhythm. Grossly normal heart sounds.  Good peripheral circulation. Respiratory: Normal respiratory effort.  No retractions. Lungs CTAB. Gastrointestinal: Soft and nontender. No distention. No CVA tenderness. Genitourinary: deferred Musculoskeletal: No lower extremity tenderness nor edema.  No joint effusions. Neurologic:  Normal speech and language. No gross focal neurologic deficits are appreciated. Skin:  Skin is warm, dry and intact. No rash noted. Psychiatric: Mood and affect are normal. Speech and behavior are normal.  ____________________________________________   LABS (all labs ordered are listed, but only abnormal results are displayed)  Labs Reviewed  CBC WITH DIFFERENTIAL/PLATELET - Abnormal; Notable for the following:       Result Value   WBC 12.9 (*)    RBC 3.10 (*)    Hemoglobin 9.4 (*)    HCT 27.7 (*)    RDW 15.9 (*)    Neutro Abs 10.7 (*)    All other components within normal limits  COMPREHENSIVE METABOLIC PANEL - Abnormal; Notable for the following:    CO2 20 (*)    Glucose, Bld 190 (*)    BUN 44 (*)    Creatinine, Ser 2.61 (*)    Calcium 8.7 (*)    Albumin 2.9 (*)    ALT 11 (*)    Total Bilirubin 0.2 (*)    GFR calc non Af Amer 15 (*)    GFR calc Af Amer 17 (*)    All other components within normal limits  TROPONIN I - Abnormal; Notable for the following:    Troponin I 0.12 (*)    All other components within normal limits  TSH - Abnormal; Notable for the following:    TSH 11.610 (*)    All other components  within normal limits  T4, FREE  URINALYSIS COMPLETEWITH MICROSCOPIC ([facility] ONLY)  BRAIN NATRIURETIC PEPTIDE   ____________________________________________  EKG  ED ECG REPORT I, Gayla Doss, the attending physician, personally viewed and interpreted this ECG.   Date: 03/31/2016  EKG Time: 20:21  Rate: 175  Rhythm: atrial fibrillation, rate 175  Axis: left  Intervals: Left bundle-branch block.  ST&T Change: No acute ST elevation or acute ST depression.  ____________________________________________  RADIOLOGY  CXR IMPRESSION:  Vascular congestion and cardiomegaly. Increased interstitial  markings raise concern for pulmonary edema. No displaced rib  fracture seen.  ____________________________________________   PROCEDURES  Procedure(s) performed: None  Procedures  Critical Care performed: Yes, see critical care note(s).  CRITICAL CARE Performed by: Toney Rakes A   Total critical care time: 35 minutes  Critical care time was exclusive of separately billable procedures and treating other patients.  Critical care was necessary to treat or prevent imminent or life-threatening deterioration.  Critical care was time spent personally by me on the following activities: development of treatment plan with patient and/or surrogate as well as nursing, discussions with consultants, evaluation of patient's response to treatment, examination of patient, obtaining history from patient or surrogate, ordering and performing treatments and interventions, ordering and review of laboratory studies, ordering and review of radiographic studies, pulse oximetry and re-evaluation of patient's condition.  ____________________________________________   INITIAL IMPRESSION / ASSESSMENT AND PLAN / ED COURSE  Pertinent labs & imaging results that were available during my care of the patient were reviewed by me and considered in my medical decision making (see chart for  details).  KYLEEN VILLATORO is a 82 y.o. female with hypertension, gout, thyroid disease who presents for evaluation of generalized weakness and poor appetite today. On arrival to the emergency department she is generally well-appearing but found to be severely tachycardic with a heart rate ranging from 160 to 170 bpm. EKG confirms new atrial fibrillation with rapid ventricular rate. We'll give Cardizem, fluids for blood pressure support, obtain screening labs and chest x-ray and anticipate admission.  ----------------------------------------- 10:54 PM on 03/31/2016 ----------------------------------------- Heart rate improving to 130 bpm after IV Cardizem bolus, now on continuous Cardizem infusion.  CBC shows a leukocytosis as well as anemia, the anemia appears chronic. CMP shows creatinine elevation at 2.61 however this appears to be near the patient's baseline. Her troponin is elevated 0.12, question NSTEMI versus demand ischemia. She denies any chest pain or difficulty breathing. Elevated TSH but Normal T4. Chest x-ray shows volume overload, we'll hold off on any additional fluids at this time.Case discussed with the hospitalist for admission at this time.  Clinical Course     ____________________________________________   FINAL CLINICAL IMPRESSION(S) / ED DIAGNOSES  Final diagnoses:  Weakness  Atrial fibrillation with rapid ventricular response (HCC)      NEW MEDICATIONS STARTED DURING THIS VISIT:  New Prescriptions   No medications on file     Note:  This document was prepared using Dragon voice recognition software and may include unintentional dictation errors.    Gayla Doss, MD 03/31/16 [phone]

## 2016-04-01 ENCOUNTER — Inpatient Hospital Stay
Admit: 2016-04-01 | Discharge: 2016-04-01 | Disposition: A | Discharge status: Home / Self Care | Payer: Medicare Other | Attending: Internal Medicine | Admitting: Internal Medicine

## 2016-04-01 LAB — BASIC METABOLIC PANEL
Anion gap: 7 (ref 5–15)
BUN: 42 mg/dL — ABNORMAL HIGH (ref 6–20)
CHLORIDE: 112 mmol/L — AB (ref 101–111)
CO2: 20 mmol/L — AB (ref 22–32)
CREATININE: 2.27 mg/dL — AB (ref 0.44–1.00)
Calcium: 8.1 mg/dL — ABNORMAL LOW (ref 8.9–10.3)
GFR calc Af Amer: 21 mL/min — ABNORMAL LOW (ref >60)
GFR calc non Af Amer: 18 mL/min — ABNORMAL LOW (ref >60)
GLUCOSE: 118 mg/dL — AB (ref 65–99)
Potassium: 3.9 mmol/L (ref 3.5–5.1)
Sodium: 139 mmol/L (ref 135–145)

## 2016-04-01 LAB — TROPONIN I
TROPONIN I: 0.11 ng/mL — AB (ref <0.03)
TROPONIN I: 0.11 ng/mL — AB (ref <0.03)
TROPONIN I: 0.12 ng/mL — AB (ref <0.03)

## 2016-04-01 LAB — CBC
HCT: 23.3 % — ABNORMAL LOW (ref 35.0–47.0)
HEMOGLOBIN: 7.7 g/dL — AB (ref 12.0–16.0)
MCH: 29.6 pg (ref 26.0–34.0)
MCHC: 33.3 g/dL (ref 32.0–36.0)
MCV: 89 fL (ref 80.0–100.0)
Platelets: 250 10*3/uL (ref 150–440)
RBC: 2.62 MIL/uL — AB (ref 3.80–5.20)
RDW: 15.7 % — ABNORMAL HIGH (ref 11.5–14.5)
WBC: 10.7 10*3/uL (ref 3.6–11.0)

## 2016-04-01 LAB — TSH: TSH: 10.599 u[IU]/mL — AB (ref 0.350–4.500)

## 2016-04-01 MED ORDER — FERROUS SULFATE 325 (65 FE) MG PO TABS
325.0000 mg | ORAL_TABLET | Freq: Every day | ORAL | Status: DC
  Administered 2016-04-01 – 2016-04-03 (×3): 325 mg via ORAL
  Filled 2016-04-01 (×3): qty 1

## 2016-04-01 MED ORDER — LEVOTHYROXINE SODIUM 50 MCG PO TABS
62.5000 ug | ORAL_TABLET | Freq: Every day | ORAL | Status: DC
  Administered 2016-04-02 – 2016-04-03 (×2): 62.5 ug via ORAL
  Filled 2016-04-01 (×2): qty 1

## 2016-04-01 MED ORDER — COLCHICINE 0.6 MG PO TABS
0.3000 mg | ORAL_TABLET | Freq: Every day | ORAL | Status: DC
  Administered 2016-04-02 – 2016-04-03 (×2): 0.3 mg via ORAL
  Filled 2016-04-01 (×2): qty 1

## 2016-04-01 MED ORDER — DILTIAZEM HCL 30 MG PO TABS
30.0000 mg | ORAL_TABLET | Freq: Four times a day (QID) | ORAL | Status: DC
Start: 2016-04-01 — End: 2016-04-02
  Administered 2016-04-01 – 2016-04-02 (×3): 30 mg via ORAL
  Filled 2016-04-01 (×3): qty 1

## 2016-04-01 NOTE — Evaluation (Signed)
Physical Therapy Evaluation Patient Details Name: Catherine Anderson MRN: [id number] DOB: 22-Oct-1922 Today's Date: 04/01/2016   History of Present Illness  Pt is a 82 y/o F brought in by her son at request of her PCP due to multiple recent falls.  She was found to be in a-fib with RVR.  Pt has since been converted back to normal sinus rhythm while on Cardizem.  Pt's PMH includes gout, thyroid disease, CKD, breast surgery.    Clinical Impression  Pt admitted with above diagnosis. Pt currently with functional limitations due to the deficits listed below (see PT Problem List). Catherine Anderson presents with generalized weakness and is quick to fatigue.  She ambulated 70 ft with min guard assist and reported BLE fatigue.  She reports frequent falls at home where she lives alone, as evidence by bruising on face, LUE, and recent fall and bruising of ribs.  Due to current mobility status and pt's h/o frequent falling, recommending SNF at d/c. Pt will benefit from skilled PT to increase their independence and safety with mobility to allow discharge to the venue listed below.      Follow Up Recommendations SNF    Equipment Recommendations  None recommended by PT    Recommendations for Other Services OT consult     Precautions / Restrictions Precautions Precautions: Fall Restrictions Weight Bearing Restrictions: No      Mobility  Bed Mobility Overal bed mobility: Modified Independent             General bed mobility comments: Increased time and effort with use of bed rail with HOB slightly elevated  Transfers Overall transfer level: Needs assistance Equipment used: Rolling walker (2 wheeled) Transfers: Sit to/from Stand Sit to Stand: Min guard         General transfer comment: Pt is slow to stand and requires cues for safe technique with sit<>stand  Ambulation/Gait Ambulation/Gait assistance: Min guard Ambulation Distance (Feet): 70 Feet Assistive device: Rolling walker (2  wheeled) Gait Pattern/deviations: Step-through pattern;Decreased stride length;Trunk flexed;Narrow base of support Gait velocity: decreased   General Gait Details: Very narrow BOS with crossover gait.  Pt requires max cues for upright posture and forward gaze which is not sustained.  She reports BLE weakness and fatigues quickly, requesting to return to room.    Stairs            Wheelchair Mobility    Modified Rankin (Stroke Patients Only)       Balance Overall balance assessment: Needs assistance;History of Falls Sitting-balance support: No upper extremity supported;Feet supported Sitting balance-Leahy Scale: Good     Standing balance support: Bilateral upper extremity supported;During functional activity Standing balance-Leahy Scale: Poor Standing balance comment: Relies on RW for support and becomes unsteady without UE support.  Can stand ~5 seconds without UE support before LOB.     Tandem Stance - Right Leg: 0 (LOB with attempt for tandem stance)                       Pertinent Vitals/Pain Pain Assessment: 0-10 Pain Score: 5  Pain Location: headache Pain Descriptors / Indicators: Headache Pain Intervention(s): Limited activity within patient's tolerance;Monitored during session    Home Living Family/patient expects to be discharged to:: Skilled nursing facility Living Arrangements: Alone Available Help at Discharge: Family;Available PRN/intermittently Type of Home: House Home Access: Stairs to enter Entrance Stairs-Rails: Left Entrance Stairs-Number of Steps: 3 Home Layout: One level Home Equipment: Walker - 2 wheels;Cane - single point;Shower  seat      Prior Function Level of Independence: Independent with assistive device(s)         Comments: Pt reports that PTA she was living alone, family would visit her briefly once each day.  She does her own cooking, cleaning, dressing.  Takes a sponge bath.  Uses a RW at all times but reports 2 recent  falls and has frequent falls.  One fall recently at church resulted in bruised ribs.     Hand Dominance        Extremity/Trunk Assessment   Upper Extremity Assessment: Generalized weakness (bruising L forearm)           Lower Extremity Assessment: Generalized weakness      Cervical / Trunk Assessment: Kyphotic  Communication   Communication: HOH  Cognition Arousal/Alertness: Awake/alert Behavior During Therapy: WFL for tasks assessed/performed Overall Cognitive Status: Within Functional Limits for tasks assessed                      General Comments General comments (skin integrity, edema, etc.): Prior to PT eval this PT spoke with Dr. Juliann Paresallwood who cleared pt for PT evaluation despite elevated troponin, thought to be related to a-fib and pt is now in NSR. HR remained in the 90s throughout session.  Pt wearing brace around trunk due to recent fall at church resulting in bruised ribs.    Exercises General Exercises - Upper Extremity Shoulder Flexion: AROM;Both;5 reps;Seated General Exercises - Lower Extremity Ankle Circles/Pumps: AROM;Both;10 reps;Seated Long Arc Quad: AROM;Both;10 reps;Seated Hip Flexion/Marching: Both;10 reps;Seated   Assessment/Plan    PT Assessment Patient needs continued PT services  PT Problem List Decreased strength;Decreased range of motion;Decreased activity tolerance;Decreased balance;Decreased mobility;Decreased knowledge of use of DME;Decreased safety awareness;Pain          PT Treatment Interventions DME instruction;Gait training;Stair training;Functional mobility training;Therapeutic activities;Therapeutic exercise;Balance training;Neuromuscular re-education;Patient/family education;Modalities    PT Goals (Current goals can be found in the Care Plan section)  Acute Rehab PT Goals Patient Stated Goal: to feel better PT Goal Formulation: With patient Time For Goal Achievement: 04/08/16 Potential to Achieve Goals: Good     Frequency Min 2X/week   Barriers to discharge Decreased caregiver support;Inaccessible home environment Steps to enter home and lives alone    Co-evaluation               End of Session Equipment Utilized During Treatment: Gait belt Activity Tolerance: Patient limited by fatigue Patient left: in chair;with call bell/phone within reach;with chair alarm set Nurse Communication: Mobility status         Time: [id number] PT Time Calculation (min) (ACUTE ONLY): 34 min   Charges:   PT Evaluation $PT Eval Moderate Complexity: 1 Procedure PT Treatments $Gait Training: 8-22 mins   PT G CodesEncarnacion Chu:       Ashley Abashian PT, DPT 04/01/2016, 4:08 PM

## 2016-04-01 NOTE — Progress Notes (Signed)
Pt is incontinent of urine. I will continue to monitor urinary output.

## 2016-04-01 NOTE — Progress Notes (Signed)
Pts cardizem gtt remains at 5mg /hr, VSS, HR shows NSR 74 bpm. I will start monitoring VS every hour for 3 hours per protocol. Cardio consult still pending. I will continue to assess.

## 2016-04-01 NOTE — Progress Notes (Signed)
Pt is stable on cardizem gtt. HR is in the 70s showing NSR. Titrated gtt down to 1010ml/hr. I will check VS per protocol and continue to monitor.

## 2016-04-01 NOTE — Progress Notes (Addendum)
[employer] [facility] [place] [facility]   PATIENT NAME: Catherine Anderson    MR#:  [id number]  DATE OF BIRTH:  05-21-1923  SUBJECTIVE:  CHIEF COMPLAINT:   Chief Complaint  Patient presents with  . Fall  Very hard of hearing, converted back to normal sinus rhythm while on Cardizem.  Eating breakfast, Hypoxic REVIEW OF SYSTEMS:  Review of Systems  Constitutional: Negative for chills, fever and weight loss.  HENT: Positive for hearing loss. Negative for nosebleeds and sore throat.   Eyes: Negative for blurred vision.  Respiratory: Negative for cough, shortness of breath and wheezing.   Cardiovascular: Negative for chest pain, orthopnea, leg swelling and PND.  Gastrointestinal: Negative for abdominal pain, constipation, diarrhea, heartburn, nausea and vomiting.  Genitourinary: Negative for dysuria and urgency.  Musculoskeletal: Positive for falls. Negative for back pain.  Skin: Negative for rash.  Neurological: Negative for dizziness, speech change, focal weakness and headaches.  Endo/Heme/Allergies: Does not bruise/bleed easily.  Psychiatric/Behavioral: Negative for depression.   DRUG ALLERGIES:  No Known Allergies VITALS:  Blood pressure (!) 128/59, pulse 75, temperature 97.8 F (36.6 C), temperature source Oral, resp. rate 18, height 5\' 3"  (1.6 m), weight 52.4 kg (115 lb 9.6 oz), SpO2 (!) 70 %. PHYSICAL EXAMINATION:  Physical Exam  Constitutional: She is oriented to person, place, and time. Vital signs are normal. She appears malnourished. She appears unhealthy. She appears cachectic.  HENT:  Head: Normocephalic and atraumatic.  Eyes: Conjunctivae and EOM are normal. Pupils are equal, round, and reactive to light.  Neck: Normal range of motion. Neck supple. No tracheal deviation present. No thyromegaly present.  Cardiovascular: Normal rate, regular rhythm and normal heart sounds.   Pulmonary/Chest: Effort normal and breath sounds normal. No respiratory distress.  She has no wheezes. She exhibits no tenderness.  Abdominal: Soft. Bowel sounds are normal. She exhibits no distension. There is no tenderness.  Musculoskeletal: Normal range of motion.  Neurological: She is alert and oriented to person, place, and time. No cranial nerve deficit.  Skin: Skin is warm and dry. No rash noted.  Psychiatric: Mood and affect normal.   LABORATORY PANEL:   CBC  Recent Labs Lab 04/01/16 0509  WBC 10.7  HGB 7.7*  HCT 23.3*  PLT 250   ------------------------------------------------------------------------------------------------------------------ Chemistries   Recent Labs Lab 03/31/16 2040 04/01/16 0509  NA 136 139  K 3.8 3.9  CL 105 112*  CO2 20* 20*  GLUCOSE 190* 118*  BUN 44* 42*  CREATININE 2.61* 2.27*  CALCIUM 8.7* 8.1*  AST 24  --   ALT 11*  --   ALKPHOS 75  --   BILITOT 0.2*  --    RADIOLOGY:  Dg Chest Portable 1 View  Result Date: 03/31/2016 CLINICAL DATA:  Status post fall, with concern for chest injury. Initial encounter. EXAM: PORTABLE CHEST 1 VIEW COMPARISON:  Chest radiograph from 03/15/2016 FINDINGS: The lungs are well-aerated. Vascular congestion is noted. Increased interstitial markings raise concern for pulmonary edema. There is no evidence of pleural effusion or pneumothorax. The cardiomediastinal silhouette is enlarged. No acute osseous abnormalities are seen. IMPRESSION: Vascular congestion and cardiomegaly. Increased interstitial markings raise concern for pulmonary edema. No displaced rib fracture seen. Electronically Signed   By: Roanna Raider M.D.   On: 03/31/2016 21:27   ASSESSMENT AND PLAN:  82 y.o. female who presents with Multiple recent falls and was found to have new onset A. fib  * Atrial fibrillation with RVR (HCC) - new onset -  Converted back to normal sinus rhythm on Cardizem drip - Pending cardiology consultation - Await echo  * HTN (hypertension) - currently stable, hold other antihypertensives for now  she is on Cardizem IV. Other antihypertensives can be used if needed.  * Anemia of chronic kidney disease - Hemoglobin 7.7 - No bleeding - We will monitor - not a candidate for anticoagulation considering multiple falls and anemia  * CKD (chronic kidney disease), stage IV (HCC) - avoid nephrotoxic medications, creatinine is at baseline.  * Hypothyroidism - increase thyroid replacement (may be 62.5 or 75) - high TSH so adjust dose     All the records are reviewed and case discussed with Care Management/Social Worker. Management plans discussed with the patient, nursing and they are in agreement.  CODE STATUS: Full code  TOTAL TIME TAKING CARE OF THIS PATIENT: 35 minutes.   More than 50% of the time was spent in counseling/coordination of care: YES  POSSIBLE D/C IN 1-2 DAYS, DEPENDING ON CLINICAL CONDITION.   [facility] CenterHAH, Catherine Anderson M.D on 04/01/2016 at 11:56 AM  Between 7am to 6pm - Pager - [phone]  After 6pm go to [email or web address] - Social research officer, [email or web address] [email or web address]  [facility] [place] [facility]  Office  [phone]  CC: Primary care physician; Mickey FarberHIES, DAVID, MD  Note: This dictation was prepared with Dragon dictation along with smaller phrase technology. Any transcriptional errors that result from this process are unintentional.

## 2016-04-01 NOTE — Progress Notes (Signed)
Initial Nutrition Assessment  DOCUMENTATION CODES:   Non-severe (moderate) malnutrition in context of chronic illness  INTERVENTION:  Ensure Enlive po BID, each supplement provides 350 kcal and 20 grams of protein  NUTRITION DIAGNOSIS:   Malnutrition related to chronic illness as evidenced by moderate depletion of body fat, moderate depletions of muscle mass.  GOAL:   Patient will meet greater than or equal to 90% of their needs  MONITOR:   PO intake, Supplement acceptance, I & O's, Labs, Weight trends  REASON FOR ASSESSMENT:   Malnutrition Screening Tool    ASSESSMENT:   Catherine Anderson  is a 82 y.o. female who presents with Multiple recent falls. She is brought in by her son tonight at request of her PCP for evaluation of the same. She was found to be in A. fib with RVR.  Spoke with Catherine Anderson at bedside. She endorses good PO PTA - states she eats a big breakfast, a sandwich in the afternoon, and a snack before bed. Does not complain of decreased appetite at this time. Had a breakfast tray during my visit that she consumed 100% of. Pt received breakfast at 10AM and lunch not too long after, reports she did not eat as much for lunch. She endorses being 130-135# back in 2013. She denies recent weight loss. Per care everywhere review pt was 117# as of 05/2015. Maintaining well, especially for a 82 yo. She denies nausea/vomiting or chewing/swallowing problems at this time. She does endorse choking from time to time, feels like food gets stuck in her upper chest, but does not seem to be a major issue. Nutrition-Focused physical exam completed. Findings are moderate-severe fat depletion, moderate muscle depletion, and moderate-severe edema.  Labs and medications reviewed: Cardizem drip  Diet Order:  Diet Heart Room service appropriate? No; Fluid consistency: Thin  Skin:  Reviewed, no issues  Last BM:  10/8  Height:   Ht Readings from Last 1 Encounters:  03/31/16 5\' 3"  (1.6 m)     Weight:   Wt Readings from Last 1 Encounters:  03/31/16 115 lb 9.6 oz (52.4 kg)    Ideal Body Weight:  52.27 kg  BMI:  Body mass index is 20.48 kg/m.  Estimated Nutritional Needs:   Kcal:  1300-1550 calories  Protein:  52-63 gm  Fluid:  >/= 1.3L  EDUCATION NEEDS:   Education needs addressed  Dionne AnoWilliam M. Teleah Villamar, MS, RD LDN Inpatient Clinical Dietitian Pager [phone]

## 2016-04-01 NOTE — Progress Notes (Signed)
  Echocardiogram 2D Echocardiogram has been performed.  Tye SavoyCasey N Andres Escandon 04/01/2016, 9:22 AM

## 2016-04-01 NOTE — Care Management (Addendum)
Patient is admitted from home with atrial fibrillation.  She describes a mechanical fall at church and found to have new onset of atrial fib.  Required cardizem drip.  She reports very very independent and still driving but does admit "over the last little bit I have not been doing as well."  Her family reports that she is not eating well and she is very weak.  Patient would not want to consider any type of long term assisted living level of care.  "I had the money but now it is all gone."  She does not elaborate. Her daughter in law present.  Discussed discharge options:  home with home health services or snf if has a qualifying stay and recommended by physical therapy.  It sounds as though patient does have access to a walker at home.  PT consult is pending. would be open to home with home health but would consider short term snf if it was really needed.  Patient is current with her pcp and denies issues accessing medical care or with transportation.

## 2016-04-01 NOTE — Progress Notes (Signed)
Pt complains of not urinating since admission. No output is documented. Pt is bladder scanned showing 226 ml in bladder. I will continue to monitor for urinary output before any intervention.

## 2016-04-01 NOTE — Progress Notes (Signed)
PO cardizem given. I will continue cardizem gtt for next 1 hour and discontinue gtt per orders. VS will remain every 4 hours until stable. I will continue to monitor.

## 2016-04-01 NOTE — Progress Notes (Signed)
Cardizem gtt titrated down to 5mg /hr. VSS, HR shows NSR 70 bpm. Cardiology consult pending. I will continue to assess.

## 2016-04-01 NOTE — Progress Notes (Signed)
Cardizem gtt remains at 5mg /hr, VSS, HR shows NSR in the 70s. I will continue to assess.

## 2016-04-01 NOTE — Progress Notes (Signed)
Cardizem gtt discontinued per protocol. VSS. NSR in the 70s. I will continue to assess.

## 2016-04-02 LAB — FOLATE: FOLATE: 10.6 ng/mL (ref >5.9)

## 2016-04-02 LAB — BASIC METABOLIC PANEL
Anion gap: 5 (ref 5–15)
BUN: 51 mg/dL — AB (ref 6–20)
CHLORIDE: 111 mmol/L (ref 101–111)
CO2: 21 mmol/L — AB (ref 22–32)
CREATININE: 2.46 mg/dL — AB (ref 0.44–1.00)
Calcium: 8.3 mg/dL — ABNORMAL LOW (ref 8.9–10.3)
GFR calc Af Amer: 19 mL/min — ABNORMAL LOW (ref >60)
GFR calc non Af Amer: 16 mL/min — ABNORMAL LOW (ref >60)
GLUCOSE: 97 mg/dL (ref 65–99)
POTASSIUM: 4 mmol/L (ref 3.5–5.1)
SODIUM: 137 mmol/L (ref 135–145)

## 2016-04-02 LAB — CBC
HEMATOCRIT: 23 % — AB (ref 35.0–47.0)
HEMOGLOBIN: 7.6 g/dL — AB (ref 12.0–16.0)
MCH: 29.7 pg (ref 26.0–34.0)
MCHC: 33 g/dL (ref 32.0–36.0)
MCV: 90.1 fL (ref 80.0–100.0)
Platelets: 275 10*3/uL (ref 150–440)
RBC: 2.55 MIL/uL — ABNORMAL LOW (ref 3.80–5.20)
RDW: 15.6 % — ABNORMAL HIGH (ref 11.5–14.5)
WBC: 10.6 10*3/uL (ref 3.6–11.0)

## 2016-04-02 LAB — IRON AND TIBC
Iron: 35 ug/dL (ref 28–170)
Saturation Ratios: 28 % (ref 10.4–31.8)
TIBC: 126 ug/dL — ABNORMAL LOW (ref 250–450)
UIBC: 91 ug/dL

## 2016-04-02 LAB — FERRITIN: FERRITIN: 342 ng/mL — AB (ref 11–307)

## 2016-04-02 LAB — ECHOCARDIOGRAM COMPLETE
Height: 63 in
Weight: 1849.6 oz

## 2016-04-02 LAB — VITAMIN B12: VITAMIN B 12: 2538 pg/mL — AB (ref 180–914)

## 2016-04-02 LAB — OCCULT BLOOD X 1 CARD TO LAB, STOOL: FECAL OCCULT BLD: NEGATIVE

## 2016-04-02 MED ORDER — DILTIAZEM HCL ER COATED BEADS 120 MG PO CP24
120.0000 mg | ORAL_CAPSULE | Freq: Every day | ORAL | Status: DC
  Administered 2016-04-02 – 2016-04-03 (×2): 120 mg via ORAL
  Filled 2016-04-02 (×2): qty 1

## 2016-04-02 NOTE — Progress Notes (Signed)
[facility] [place] [facility]   PATIENT NAME: Catherine Anderson    MR#:  [id number]  DATE OF BIRTH:  [birth year]  SUBJECTIVE:  CHIEF COMPLAINT:   Chief Complaint  Patient presents with  . Fall  Patient noted normal sinus rhythm, still very weak Denies any chest pain or shortness of breath    REVIEW OF SYSTEMS:  Review of Systems  Constitutional: Negative for chills, fever and weight loss.  HENT: Positive for hearing loss. Negative for nosebleeds and sore throat.   Eyes: Negative for blurred vision.  Respiratory: Negative for cough, shortness of breath and wheezing.   Cardiovascular: Negative for chest pain, orthopnea, leg swelling and PND.  Gastrointestinal: Negative for abdominal pain, constipation, diarrhea, heartburn, nausea and vomiting.  Genitourinary: Negative for dysuria and urgency.  Musculoskeletal: Positive for falls. Negative for back pain.  Skin: Negative for rash.  Neurological: Negative for dizziness, speech change, focal weakness and headaches.  Endo/Heme/Allergies: Does not bruise/bleed easily.  Psychiatric/Behavioral: Negative for depression.   DRUG ALLERGIES:  No Known Allergies VITALS:  Blood pressure (!) 146/73, pulse 77, temperature 98.4 F (36.9 C), temperature source Oral, resp. rate 18, height 5\' 3"  (1.6 m), weight 115 lb 9.6 oz (52.4 kg), SpO2 93 %. PHYSICAL EXAMINATION:  Physical Exam  Constitutional: She is oriented to person, place, and time. Vital signs are normal. She appears malnourished. She appears unhealthy. She appears cachectic.  HENT:  Head: Normocephalic and atraumatic.  Eyes: Conjunctivae and EOM are normal. Pupils are equal, round, and reactive to light.  Neck: Normal range of motion. Neck supple. No tracheal deviation present. No thyromegaly present.  Cardiovascular: Normal rate, regular rhythm and normal heart sounds.   Pulmonary/Chest: Effort normal and breath sounds normal. No respiratory distress. She has  no wheezes. She exhibits no tenderness.  Abdominal: Soft. Bowel sounds are normal. She exhibits no distension. There is no tenderness.  Musculoskeletal: Normal range of motion.  Neurological: She is alert and oriented to person, place, and time. No cranial nerve deficit.  Skin: Skin is warm and dry. No rash noted.  Psychiatric: Mood and affect normal.   LABORATORY PANEL:   CBC  Recent Labs Lab 04/02/16 0402  WBC 10.6  HGB 7.6*  HCT 23.0*  PLT 275   ------------------------------------------------------------------------------------------------------------------ Chemistries   Recent Labs Lab 03/31/16 2040  04/02/16 0402  NA 136  < > 137  K 3.8  < > 4.0  CL 105  < > 111  CO2 20*  < > 21*  GLUCOSE 190*  < > 97  BUN 44*  < > 51*  CREATININE 2.61*  < > 2.46*  CALCIUM 8.7*  < > 8.3*  AST 24  --   --   ALT 11*  --   --   ALKPHOS 75  --   --   BILITOT 0.2*  --   --   < > = values in this interval not displayed. RADIOLOGY:  No results found. ASSESSMENT AND PLAN:  82 y.o. female who presents with Multiple recent falls and was found to have new onset A. fib  * Atrial fibrillation with RVR (HCC) - new onset - Converted back to normal sinus rhythm  - Start on Cardizem CD - Seen by cardiology and echo pending   * HTN (hypertension) - blood pressure stable  * Anemia of chronic kidney disease - Hemoglobin 7.7 - No bleeding - We will monitor   * CKD (chronic kidney disease), stage IV (HCC) -  avoid nephrotoxic medications, creatinine is at baseline.  * Hypothyroidism -Synthroid is increased - high TSH so adjust dose     All the records are reviewed and case discussed with Care Management/Social Worker. Management plans discussed with the patient, nursing and they are in agreement.  CODE STATUS: Full code  TOTAL TIME TAKING CARE OF THIS PATIENT: .   More than 50% of the time was spent in counseling/coordination of care: YES  POSSIBLE D/C IN 1-2 DAYS,  DEPENDING ON CLINICAL CONDITION.   Auburn Bilberry M.D on 04/02/2016 at 11:35 AM  Between 7am to 6pm - Pager - [phone]  After 6pm go to [email or web address] - Social research officer, government  [facility]  Office  [phone]  CC: Primary care physician; Mickey Farber, MD  Note: This dictation was prepared with Dragon dictation along with smaller phrase technology. Any transcriptional errors that result from this process are unintentional.

## 2016-04-02 NOTE — Progress Notes (Signed)
Physical Therapy Treatment Patient Details Name: Catherine Anderson MRN: [id number] DOB: May 29, 1923 Today's Date: 04/02/2016    History of Present Illness Pt is a 82 y/o F brought in by her son at request of her PCP due to multiple recent falls.  She was found to be in a-fib with RVR.  Pt has since been converted back to normal sinus rhythm while on Cardizem.  Pt's PMH includes gout, thyroid disease, CKD, breast surgery.    PT Comments    Catherine Anderson is making modest progress toward therapy goals.  She demonstrated improvement in her ambulatory endurance, ambulating 100 ft but fatiguing toward the end.  She tolerated therapeutic exercises but was limited in the number of repetitions due to fatigue.  Pt will benefit from continued skilled PT services to increase functional independence and safety.   Follow Up Recommendations  SNF     Equipment Recommendations  None recommended by PT    Recommendations for Other Services OT consult     Precautions / Restrictions Precautions Precautions: Fall Restrictions Weight Bearing Restrictions: No    Mobility  Bed Mobility Overal bed mobility: Modified Independent             General bed mobility comments: Increased time and effort.  No use of bed rail and HOB flat.  Transfers Overall transfer level: Needs assistance Equipment used: Rolling walker (2 wheeled) Transfers: Sit to/from Stand Sit to Stand: Min guard         General transfer comment: Pt is slow to stand.  Cues to widen BOS during sit<>stand for improved balance.    Ambulation/Gait Ambulation/Gait assistance: Min guard Ambulation Distance (Feet): 100 Feet Assistive device: Rolling walker (2 wheeled) Gait Pattern/deviations: Step-through pattern;Decreased stride length;Trunk flexed Gait velocity: decreased   General Gait Details: Very narrow BOS with crossover gait.  Cues for upright posture and forward gaze which is not sustained.  She reports BLE weakness and  fatigues quickly.   Stairs            Wheelchair Mobility    Modified Rankin (Stroke Patients Only)       Balance Overall balance assessment: Needs assistance;History of Falls Sitting-balance support: No upper extremity supported;Feet supported Sitting balance-Leahy Scale: Good     Standing balance support: Bilateral upper extremity supported;During functional activity Standing balance-Leahy Scale: Poor Standing balance comment: Relies on RW for support     Tandem Stance - Right Leg: 0 (LOB with attempt for tandem stance)              Cognition Arousal/Alertness: Awake/alert Behavior During Therapy: WFL for tasks assessed/performed Overall Cognitive Status: Within Functional Limits for tasks assessed                      Exercises General Exercises - Upper Extremity Shoulder Flexion: AROM;Both;Seated;10 reps General Exercises - Lower Extremity Long Arc Quad: AROM;Both;10 reps;Seated Hip ABduction/ADduction: AROM;Both;10 reps;Seated Straight Leg Raises: AROM;Both;10 reps;Seated Hip Flexion/Marching: Both;10 reps;Seated Other Exercises Other Exercises: Sit<>stand x5 from chair with use of BUE.  Cues to widen BOS to improve stability.    General Comments        Pertinent Vitals/Pain Pain Assessment: No/denies pain Pain Intervention(s): Monitored during session    Home Living                      Prior Function            PT Goals (current goals can now be found  in the care plan section) Acute Rehab PT Goals Patient Stated Goal: to feel better PT Goal Formulation: With patient Time For Goal Achievement: 04/08/16 Potential to Achieve Goals: Good Progress towards PT goals: Progressing toward goals    Frequency    Min 2X/week      PT Plan Current plan remains appropriate    Co-evaluation             End of Session Equipment Utilized During Treatment: Gait belt Activity Tolerance: Patient limited by fatigue Patient  left: in chair;with call bell/phone within reach;with chair alarm set     Time: 1610-9604 PT Time Calculation (min) (ACUTE ONLY): 39 min  Charges:  $Gait Training: 8-22 mins $Therapeutic Exercise: 23-37 mins                    G Codes:      Encarnacion Chu PT, DPT 04/02/2016, 9:35 AM

## 2016-04-02 NOTE — Consult Note (Signed)
[facility]  CARDIOLOGY CONSULT NOTE  Patient ID: Catherine Anderson MRN: [id number] DOB/AGE: 1923/03/25 82 y.o.  Admit date: 03/31/2016 Referring Physician Anne Hahn Primary Physician [facility] Primary Cardiologist Gwen Pounds Reason for Consultation Atrial fibrillation  HPI: 82 year old female referred for evaluation of atrial fibrillation. The patient's had a recent history of falls. On 03/31/2016 the patient fell, apparently lost consciousness, hit her head. She was brought to [facility] emergency room by her son, ECG revealed atrial fibrillation with a rapid ventricular rate. Patient was initially treated with Cardizem drip, started on oral Cardizem with much better control of ventricular rate. The patient currently is asymptomatic, denies chest pain, shortness of breath, palpitations or heart racing. Admission labs were notable for borderline elevated troponin of 0.12 with follow-up 0.11. Admission EKG revealed atrial fibrillation with left bundle branch block at a rate of 175 bpm.  Review of systems complete and found to be negative unless listed above     Past Medical History:  Diagnosis Date  . CKD (chronic kidney disease), stage IV (HCC)   . Gout   . History of GI bleed   . HLD (hyperlipidemia)   . Hypertension   . Thyroid disease     Past Surgical History:  Procedure Laterality Date  . BREAST SURGERY      Prescriptions Prior to Admission  Medication Sig Dispense Refill Last Dose  . cholecalciferol (VITAMIN D) 1000 units tablet Take 2,000 Units by mouth daily.     . colchicine 0.6 MG tablet Take 1 tablet (0.6 mg total) by mouth 2 (two) times daily. 14 tablet 2 03/31/2016 at Unknown time  . ferrous sulfate 325 (65 FE) MG tablet Take 325 mg by mouth daily with breakfast.   03/31/2016 at Unknown time  . levothyroxine (SYNTHROID, LEVOTHROID) 50 MCG tablet Take 50 mcg by mouth daily before breakfast.   03/31/2016 at Unknown time  . lisinopril (PRINIVIL,ZESTRIL) 2.5 MG tablet Take 2.5 mg by  mouth 2 (two) times daily.   03/31/2016 at Unknown time  . traMADol (ULTRAM) 50 MG tablet Take 1 tablet (50 mg total) by mouth every 6 (six) hours as needed. 20 tablet 0 prn   Social History   Social History  . Marital status: Widowed    Spouse name: N/A  . Number of children: N/A  . Years of education: N/A   Occupational History  . Not on file.   Social History Main Topics  . Smoking status: Never Smoker  . Smokeless tobacco: Never Used  . Alcohol use No  . Drug use: No  . Sexual activity: Not on file   Other Topics Concern  . Not on file   Social History Narrative  . No narrative on file    Family History  Problem Relation Age of Onset  . Aortic aneurysm Mother   . Gout Brother   . Heart disease Brother       Review of systems complete and found to be negative unless listed above      PHYSICAL EXAM  General: Well developed, well nourished, in no acute distress HEENT:  Normocephalic and atramatic Neck:  No JVD.  Lungs: Clear bilaterally to auscultation and percussion. Heart: HRRR . Normal S1 and S2 without gallops or murmurs.  Abdomen: Bowel sounds are positive, abdomen soft and non-tender  Msk:  Back normal, normal gait. Normal strength and tone for age. Extremities: No clubbing, cyanosis or edema.   Neuro: Alert and oriented X 3. Psych:  Good affect, responds appropriately  Labs:  Lab Results  Component Value Date   WBC 10.6 04/02/2016   HGB 7.6 (L) 04/02/2016   HCT 23.0 (L) 04/02/2016   MCV 90.1 04/02/2016   PLT 275 04/02/2016    Recent Labs Lab 03/31/16 2040  04/02/16 0402  NA 136  < > 137  K 3.8  < > 4.0  CL 105  < > 111  CO2 20*  < > 21*  BUN 44*  < > 51*  CREATININE 2.61*  < > 2.46*  CALCIUM 8.7*  < > 8.3*  PROT 7.2  --   --   BILITOT 0.2*  --   --   ALKPHOS 75  --   --   ALT 11*  --   --   AST 24  --   --   GLUCOSE 190*  < > 97  < > = values in this interval not displayed. Lab Results  Component Value Date   CKTOTAL 72  01/15/2014   CKMB 1.6 01/15/2014   TROPONINI 0.11 (HH) 04/01/2016   No results found for: CHOL No results found for: HDL No results found for: LDLCALC No results found for: TRIG No results found for: CHOLHDL No results found for: LDLDIRECT    Radiology: Dg Ribs Unilateral W/chest Right  Result Date: 03/15/2016 CLINICAL DATA:  Pain following fall EXAM: RIGHT RIBS AND CHEST - 3+ VIEW COMPARISON:  Chest radiograph April 03, 2009 FINDINGS: Frontal as well as oblique and cone-down lower rib images obtained. There is interstitial edema with cardiomegaly and mild pulmonary venous hypertension. There is atherosclerotic calcification in the aorta. No adenopathy. No airspace consolidation. Patient has had previous kyphoplasty procedure at L1. There is anterior wedging of several lower thoracic vertebral bodies. There is no pneumothorax or pleural effusion. There is evidence of an old healed fracture of the right third rib laterally. No acute rib fracture evident. IMPRESSION: Old third rib fracture. No acute rib fracture is evident. Patient has had previous kyphoplasty procedure at L1. There is anterior wedging of several lower thoracic vertebral bodies, age uncertain. No pneumothorax or pleural effusion. Evidence a degree of underlying congestive heart failure. There is aortic atherosclerosis. Electronically Signed   By: Bretta BangWilliam  Woodruff III M.D.   On: 03/15/2016 14:54   Ct Head Wo Contrast  Result Date: 03/15/2016 CLINICAL DATA:  Fall, dizziness, facial injury EXAM: CT HEAD WITHOUT CONTRAST TECHNIQUE: Contiguous axial images were obtained from the base of the skull through the vertex without intravenous contrast. COMPARISON:  None. FINDINGS: Brain: No evidence of acute infarction, hemorrhage, hydrocephalus, extra-axial collection or mass lesion/mass effect. Vascular: No hyperdense vessel or unexpected calcification. Skull: No evidence of calvarial fracture. Sinuses/Orbits: The visualized paranasal sinuses  are essentially clear. The mastoid air cells are unopacified. Other: Global cortical atrophy.  No ventriculomegaly. Subcortical white matter and periventricular small vessel ischemic changes. Intracranial atherosclerosis. IMPRESSION: No evidence of acute intracranial abnormality. Atrophy with small vessel ischemic changes. Electronically Signed   By: Charline BillsSriyesh  Krishnan M.D.   On: 03/15/2016 14:37   Dg Chest Portable 1 View  Result Date: 03/31/2016 CLINICAL DATA:  Status post fall, with concern for chest injury. Initial encounter. EXAM: PORTABLE CHEST 1 VIEW COMPARISON:  Chest radiograph from 03/15/2016 FINDINGS: The lungs are well-aerated. Vascular congestion is noted. Increased interstitial markings raise concern for pulmonary edema. There is no evidence of pleural effusion or pneumothorax. The cardiomediastinal silhouette is enlarged. No acute osseous abnormalities are seen. IMPRESSION: Vascular congestion and cardiomegaly. Increased interstitial markings raise concern for  pulmonary edema. No displaced rib fracture seen. Electronically Signed   By: Roanna Raider M.D.   On: 03/31/2016 21:27   Dg Knee Complete 4 Views Right  Result Date: 03/15/2016 CLINICAL DATA:  Pain following fall EXAM: RIGHT KNEE - COMPLETE 4+ VIEW COMPARISON:  None. FINDINGS: Frontal, lateral, and bilateral oblique views were obtained. There is no demonstrable fracture or dislocation. There is a small joint effusion. There is mild generalized joint space narrowing. There is chondrocalcinosis. There is mild spurring medially and laterally. Bones are somewhat osteoporotic. There is atherosclerotic calcification in the distal superficial femoral artery and popliteal artery regions. IMPRESSION: No fracture or dislocation. Joint effusion present. There is a degree of osteoarthritic change with chondrocalcinosis. Chondrocalcinosis may be seen with osteoarthritis but also may be seen with calcium pyrophosphate deposition disease. There are  foci of arterial atherosclerosis. Bones appear somewhat osteoporotic. Electronically Signed   By: Bretta Bang III M.D.   On: 03/15/2016 14:51    EKG: Atrial fibrillation, with left bundle branch block, with rapid ventricular rate, at a rate of 175  ASSESSMENT AND PLAN:   1. Recent multiple falls, syncope, likely due to rapid atrial fibrillation 2. Atrial fibrillation, with rapid ventricular rate, controlled with oral Cardizem, currently asymptomatic 3. Borderline elevated troponin, likely due to demand supply ischemia, in the setting of atrial fibrillation  Recommendations  1. Agree with current therapy, change diltiazem to long-acting Cardizem 2. Defer chronic anticoagulation at this time, in light of patient's advanced age, recent falls, chronic kidney disease. This can be readdressed as outpatient with Dr. Gwen Pounds. 3. Review 2-D echocardiogram  Signed: Shawnita Krizek MD,PhD, [facility] 04/02/2016, 8:48 AM

## 2016-04-02 NOTE — Progress Notes (Signed)
Specialty low (falls) bed delivered at this time. Patient remains in the room chair. Catherine FavreSteven M [facility]

## 2016-04-02 NOTE — NC FL2 (Signed)
[place] MEDICAID FL2 LEVEL OF CARE SCREENING TOOL     IDENTIFICATION  Patient Name: Jeanette CapriceLouise M Kenedy Birthdate: September 15, 1922 Sex: female Admission Date (Current Location): 03/31/2016  [place]   Facility and Address:  [facility], [address], [place], [place] [address]      Provider Number: [id number]  Attending Physician Name and Address:  Auburn BilberryShreyang Patel, MD  Relative Name and Phone Number:  Mahan,Joyce A Relative [phone]     Current Level of Care: Hospital Recommended Level of Care: Skilled Nursing Facility Prior Approval Number:    Date Approved/Denied:   PASRR Number: [id number] [id number]  Discharge Plan: SNF    Current Diagnoses: Patient Active Problem List   Diagnosis Date Noted  . Atrial fibrillation with RVR (HCC) 03/31/2016  . HTN (hypertension) 03/31/2016  . HLD (hyperlipidemia) 03/31/2016  . CKD (chronic kidney disease), stage IV (HCC) 03/31/2016  . Hypothyroidism 03/31/2016    Orientation RESPIRATION BLADDER Height & Weight     Self, Time, Situation, Place  Normal Continent Weight: 115 lb 9.6 oz (52.4 kg) Height:  5\' 3"  (160 cm)  BEHAVIORAL SYMPTOMS/MOOD NEUROLOGICAL BOWEL NUTRITION STATUS      Continent Diet (Cardiac diet)  AMBULATORY STATUS COMMUNICATION OF NEEDS Skin   Limited Assist Verbally Normal                       Personal Care Assistance Level of Assistance  Bathing, Feeding, Dressing Bathing Assistance: Limited assistance Feeding assistance: Independent Dressing Assistance: Limited assistance     Functional Limitations Info  Sight, Hearing, Speech Sight Info: Adequate Hearing Info: Adequate Speech Info: Adequate    SPECIAL CARE FACTORS FREQUENCY  PT (By licensed PT)     PT Frequency: 5x a week              Contractures Contractures Info: Not present    Additional Factors Info  Code Status, Allergies Code Status Info: Full Code Allergies Info: NKA            Current Medications (04/02/2016):  This is the current hospital active medication list Current Facility-Administered Medications  Medication Dose Route Frequency Provider Last Rate Last Dose  . acetaminophen (TYLENOL) tablet 650 mg  650 mg Oral Q6H PRN Oralia Manisavid Willis, MD       Or  . acetaminophen (TYLENOL) suppository 650 mg  650 mg Rectal Q6H PRN Oralia Manisavid Willis, MD      . colchicine tablet 0.3 mg  0.3 mg Oral Daily Vipul Sherryll BurgerShah, MD   0.3 mg at 04/02/16 0959  . diltiazem (CARDIZEM CD) 24 hr capsule 120 mg  120 mg Oral Daily Marcina MillardAlexander Paraschos, MD   120 mg at 04/02/16 1009  . ferrous sulfate tablet 325 mg  325 mg Oral Q breakfast Delfino LovettVipul Shah, MD   325 mg at 04/02/16 0819  . heparin injection 5,000 Units  5,000 Units Subcutaneous Q8H Oralia Manisavid Willis, MD   5,000 Units at 04/02/16 1308  . levothyroxine (SYNTHROID, LEVOTHROID) tablet 62.5 mcg  62.5 mcg Oral QAC breakfast Delfino LovettVipul Shah, MD   62.5 mcg at 04/02/16 0819  . ondansetron (ZOFRAN) tablet 4 mg  4 mg Oral Q6H PRN Oralia Manisavid Willis, MD       Or  . ondansetron [facility](ZOFRAN) injection 4 mg  4 mg Intravenous Q6H PRN Oralia Manisavid Willis, MD      . sodium chloride flush (NS) 0.9 % injection 3 mL  3 mL Intravenous Q12H Oralia Manisavid Willis, MD  3 mL at 04/02/16 1000  . traMADol (ULTRAM) tablet 50-100 mg  50-100 mg Oral Q12H PRN Oralia Manis, MD         Discharge Medications: Please see discharge summary for a list of discharge medications.  Relevant Imaging Results:  Relevant Lab Results:   Additional Information SSN [id number]  Darleene Cleaver

## 2016-04-02 NOTE — Clinical Social Work Note (Signed)
Clinical Social Work Assessment  Patient Details  Name: Catherine Anderson MRN: [id number] Date of Birth: 1923-01-31  Date of referral:  04/02/16               Reason for consult:  Facility Placement                Permission sought to share information with:  Family Supports, Magazine features editoracility Contact Representative Permission granted to share information::  Yes, Verbal Permission Granted  Name::     Mahan,Joyce A Relative [phone]   Agency::  SNF admissions  Relationship::     Contact Information:     Housing/Transportation Living arrangements for the past 2 months:  Single Family Home Source of Information:  Patient Patient Interpreter Needed:  None Criminal Activity/Legal Involvement Pertinent to Current Situation/Hospitalization:  No - Comment as needed Significant Relationships:  Other Family Members Lives with:  Self Do you feel safe going back to the place where you live?  No Need for family participation in patient care:  Yes (Comment) (Patient requests to have family help with decision making for SNF.)  Care giving concerns:  Patient would like to go home but she understands she agrees to go to rehab before she goes home.   Social Worker assessment / plan:  Patient is a 82 year old female who lives alone.  Patient is alert and oriented x4 and able to express her opions.  Patient states she has not been to rehab before, MSW explained to her what to expect and what involves in the process of finding a bed at a SNF.  Patient said she would rather go home after hospital stay, but is agreeable to going to SNF for short term rehab.  Patient was explained how insurance will pay for her stay and what to expect day of discharge.  Patient requested that MSW contact one of her family members Ernestene KielJoyce Mahan [phone] to discuss SNF placement and to explain process.  Patient expressed that she did not have any other questions or concerns.   Employment status:  Retired Health and safety inspectornsurance information:   Medicare PT Recommendations:  Skilled Nursing Facility Information / Referral to community resources:  Skilled Nursing Facility  Patient/Family's Response to care:  Patient hesitant but agreeable to go to SNF for short term rehab.  Patient/Family's Understanding of and Emotional Response to Diagnosis, Current Treatment, and Prognosis:  Patient expressed that she would rather go home, but understands that she should get some therapy before she returns back home.  Emotional Assessment Appearance:  Appears stated age Attitude/Demeanor/Rapport:    Affect (typically observed):  Appropriate, Calm Orientation:  Oriented to Self, Oriented to Place, Oriented to  Time, Oriented to Situation Alcohol / Substance use:  Not Applicable Psych involvement (Current and /or in the community):  No (Comment)  Discharge Needs  Concerns to be addressed:  Lack of Support Readmission within the last 30 days:  No Current discharge risk:  Lives alone, Lack of support system Barriers to Discharge:  Continued Medical Work up   Darleene Cleavernterhaus, Leon Montoya R 04/02/2016, 2:25 PM

## 2016-04-02 NOTE — Clinical Social Work Placement (Signed)
   CLINICAL SOCIAL WORK PLACEMENT  NOTE  Date:  04/02/2016  Patient Details  Name: Catherine Anderson MRN: [id number] Date of Birth: [birth year]  Clinical Social Work is seeking post-discharge placement for this patient at the Skilled  Nursing Facility level of care (*CSW will initial, date and re-position this form in  chart as items are completed):  Yes   Patient/family provided with [facility] Clinical Social Work Department's list of facilities offering this level of care within the geographic area requested by the patient (or if unable, by the patient's family).  Yes   Patient/family informed of their freedom to choose among providers that offer the needed level of care, that participate in Medicare, Medicaid or managed care program needed by the patient, have an available bed and are willing to accept the patient.  Yes   Patient/family informed of [facility]'s ownership interest in [facility] and [facility], as well as of the fact that they are under no obligation to receive care at these facilities.  PASRR submitted to EDS on 04/02/16     PASRR number received on 04/02/16     Existing PASRR number confirmed on       FL2 transmitted to all facilities in geographic area requested by pt/family on 04/02/16     FL2 transmitted to all facilities within larger geographic area on       Patient informed that his/her managed care company has contracts with or will negotiate with certain facilities, including the following:            Patient/family informed of bed offers received.  Patient chooses bed at       Physician recommends and patient chooses bed at      Patient to be transferred to   on  .  Patient to be transferred to facility by       Patient family notified on   of transfer.  Name of family member notified:        PHYSICIAN Please sign FL2     Additional Comment:    _______________________________________________ Catherine Anderson, Catherine Anderson 04/02/2016, 2:37  PM

## 2016-04-02 NOTE — Clinical Social Work Note (Signed)
MSW presented bed offers to patient's sister in law Alona BeneJoyce per her request.  Patient's sister in law will review bed offers and then contact MSW back with decision.  MSW to continue to follow patient's progress throughout discharge planning.  Ervin KnackEric R. Falisa Lamora, MSW [phone]  Mon-Fri 8a-4:30p 04/02/2016 4:00 PM

## 2016-04-03 DIAGNOSIS — Z8711 Personal history of peptic ulcer disease: Secondary | ICD-10-CM | POA: Diagnosis not present

## 2016-04-03 DIAGNOSIS — E1122 Type 2 diabetes mellitus with diabetic chronic kidney disease: Secondary | ICD-10-CM | POA: Diagnosis present

## 2016-04-03 DIAGNOSIS — I129 Hypertensive chronic kidney disease with stage 1 through stage 4 chronic kidney disease, or unspecified chronic kidney disease: Secondary | ICD-10-CM | POA: Diagnosis present

## 2016-04-03 DIAGNOSIS — N184 Chronic kidney disease, stage 4 (severe): Secondary | ICD-10-CM | POA: Diagnosis present

## 2016-04-03 DIAGNOSIS — M810 Age-related osteoporosis without current pathological fracture: Secondary | ICD-10-CM | POA: Diagnosis present

## 2016-04-03 DIAGNOSIS — E785 Hyperlipidemia, unspecified: Secondary | ICD-10-CM | POA: Diagnosis present

## 2016-04-03 DIAGNOSIS — D638 Anemia in other chronic diseases classified elsewhere: Secondary | ICD-10-CM | POA: Diagnosis not present

## 2016-04-03 DIAGNOSIS — N179 Acute kidney failure, unspecified: Secondary | ICD-10-CM | POA: Diagnosis present

## 2016-04-03 DIAGNOSIS — E860 Dehydration: Secondary | ICD-10-CM | POA: Diagnosis present

## 2016-04-03 DIAGNOSIS — R197 Diarrhea, unspecified: Secondary | ICD-10-CM | POA: Diagnosis not present

## 2016-04-03 DIAGNOSIS — Z7982 Long term (current) use of aspirin: Secondary | ICD-10-CM | POA: Diagnosis not present

## 2016-04-03 DIAGNOSIS — E039 Hypothyroidism, unspecified: Secondary | ICD-10-CM | POA: Diagnosis present

## 2016-04-03 DIAGNOSIS — M6281 Muscle weakness (generalized): Secondary | ICD-10-CM | POA: Diagnosis not present

## 2016-04-03 DIAGNOSIS — E538 Deficiency of other specified B group vitamins: Secondary | ICD-10-CM | POA: Diagnosis present

## 2016-04-03 DIAGNOSIS — D631 Anemia in chronic kidney disease: Secondary | ICD-10-CM | POA: Diagnosis present

## 2016-04-03 DIAGNOSIS — I480 Paroxysmal atrial fibrillation: Secondary | ICD-10-CM | POA: Diagnosis not present

## 2016-04-03 DIAGNOSIS — I4891 Unspecified atrial fibrillation: Secondary | ICD-10-CM | POA: Diagnosis not present

## 2016-04-03 DIAGNOSIS — R109 Unspecified abdominal pain: Secondary | ICD-10-CM | POA: Diagnosis not present

## 2016-04-03 DIAGNOSIS — A0472 Enterocolitis due to Clostridium difficile, not specified as recurrent: Secondary | ICD-10-CM | POA: Diagnosis present

## 2016-04-03 DIAGNOSIS — Z79899 Other long term (current) drug therapy: Secondary | ICD-10-CM | POA: Diagnosis not present

## 2016-04-03 DIAGNOSIS — E872 Acidosis: Secondary | ICD-10-CM | POA: Diagnosis present

## 2016-04-03 DIAGNOSIS — M109 Gout, unspecified: Secondary | ICD-10-CM | POA: Diagnosis present

## 2016-04-03 DIAGNOSIS — I083 Combined rheumatic disorders of mitral, aortic and tricuspid valves: Secondary | ICD-10-CM | POA: Diagnosis not present

## 2016-04-03 DIAGNOSIS — Z8249 Family history of ischemic heart disease and other diseases of the circulatory system: Secondary | ICD-10-CM | POA: Diagnosis not present

## 2016-04-03 DIAGNOSIS — R7989 Other specified abnormal findings of blood chemistry: Secondary | ICD-10-CM | POA: Diagnosis present

## 2016-04-03 DIAGNOSIS — Z9889 Other specified postprocedural states: Secondary | ICD-10-CM | POA: Diagnosis not present

## 2016-04-03 DIAGNOSIS — I10 Essential (primary) hypertension: Secondary | ICD-10-CM | POA: Diagnosis not present

## 2016-04-03 DIAGNOSIS — I5189 Other ill-defined heart diseases: Secondary | ICD-10-CM | POA: Diagnosis not present

## 2016-04-03 DIAGNOSIS — N261 Atrophy of kidney (terminal): Secondary | ICD-10-CM | POA: Diagnosis present

## 2016-04-03 DIAGNOSIS — D508 Other iron deficiency anemias: Secondary | ICD-10-CM | POA: Diagnosis not present

## 2016-04-03 DIAGNOSIS — R198 Other specified symptoms and signs involving the digestive system and abdomen: Secondary | ICD-10-CM | POA: Diagnosis not present

## 2016-04-03 LAB — CBC
HCT: 23.5 % — ABNORMAL LOW (ref 35.0–47.0)
HEMOGLOBIN: 8 g/dL — AB (ref 12.0–16.0)
MCH: 30.2 pg (ref 26.0–34.0)
MCHC: 34.1 g/dL (ref 32.0–36.0)
MCV: 88.4 fL (ref 80.0–100.0)
PLATELETS: 328 10*3/uL (ref 150–440)
RBC: 2.65 MIL/uL — AB (ref 3.80–5.20)
RDW: 15.8 % — ABNORMAL HIGH (ref 11.5–14.5)
WBC: 9.2 10*3/uL (ref 3.6–11.0)

## 2016-04-03 LAB — BASIC METABOLIC PANEL
Anion gap: 7 (ref 5–15)
BUN: 48 mg/dL — AB (ref 6–20)
CHLORIDE: 111 mmol/L (ref 101–111)
CO2: 21 mmol/L — AB (ref 22–32)
Calcium: 8.7 mg/dL — ABNORMAL LOW (ref 8.9–10.3)
Creatinine, Ser: 2.36 mg/dL — ABNORMAL HIGH (ref 0.44–1.00)
GFR calc Af Amer: 20 mL/min — ABNORMAL LOW (ref >60)
GFR calc non Af Amer: 17 mL/min — ABNORMAL LOW (ref >60)
GLUCOSE: 95 mg/dL (ref 65–99)
POTASSIUM: 4.6 mmol/L (ref 3.5–5.1)
Sodium: 139 mmol/L (ref 135–145)

## 2016-04-03 MED ORDER — LEVOTHYROXINE SODIUM 125 MCG PO TABS: 62.5000 ug | ORAL_TABLET | Freq: Every day | ORAL | Status: AC

## 2016-04-03 MED ORDER — ASPIRIN EC 325 MG PO TBEC: 325.0000 mg | DELAYED_RELEASE_TABLET | Freq: Every day | ORAL | 0 refills | Status: AC

## 2016-04-03 MED ORDER — AMLODIPINE BESYLATE 5 MG PO TABS: 5.0000 mg | ORAL_TABLET | Freq: Every day | ORAL | Status: DC

## 2016-04-03 MED ORDER — LISINOPRIL 5 MG PO TABS
5.0000 mg | ORAL_TABLET | Freq: Every day | ORAL | Status: DC
  Administered 2016-04-03: 5 mg via ORAL
  Filled 2016-04-03: qty 1

## 2016-04-03 MED ORDER — DILTIAZEM HCL ER COATED BEADS 120 MG PO CP24: 120.0000 mg | ORAL_CAPSULE | Freq: Every day | ORAL | Status: AC

## 2016-04-03 MED ORDER — LISINOPRIL 5 MG PO TABS: 5.0000 mg | ORAL_TABLET | Freq: Every day | ORAL | Status: DC

## 2016-04-03 NOTE — Progress Notes (Signed)
Patient is being discharged to [facility].  Family will be transporting her.  Discharge instructions given to patient and family.  A paper copy was printed for the family, and one for [facility].  Report was called to [facility].

## 2016-04-03 NOTE — Clinical Social Work Note (Signed)
MSW spoke to patient's niece who spoke to patient and she would [facility].  [facility] who said [facility] can [facility] patient today.  Patient to be d[facility]c'ed today [facility].  Patient and family agreeable to plans will transport via family's car RN to call report to [phone].  Patient's family said they will be here to transport patient around 5pm today, [facility] is aware and can still accept patient.  Windell MouldingEric Verena Shawgo, MSW Mon-Fri 8a-4:30p [phone]

## 2016-04-03 NOTE — Discharge Instructions (Signed)

## 2016-04-03 NOTE — Care Management Important Message (Signed)
Important Message  Patient Details  Name: Catherine Anderson MRN: [id number] Date of Birth: 01/19/1923   Medicare Important Message Given:  Yes    Eber HongGreene, Kelvin Sennett R, RN 04/03/2016, 11:00 AM

## 2016-04-03 NOTE — Discharge Summary (Addendum)
Catherine Anderson, 82 y.o., DOB Jun 02, 1923, MRN [id number]. Admission date: 03/31/2016 Discharge Date 04/03/2016 Primary MD Mickey Farber, MD Admitting Physician Oralia Manis, MD  Admission Diagnosis  Weakness [R53.1] Atrial fibrillation with rapid ventricular response [facility]) [I48.91]  Discharge Diagnosis   Principal Problem:   Atrial fibrillation with RVR (HCC)   Falls   Anemia of chronic disease   Systolic dysfunction without evidence of CHF   Mitral and tricuspid valve regurgitation   HTN (hypertension)   HLD (hyperlipidemia)   CKD (chronic kidney disease), stage IV [facility])   Hypothyroidism          Hospital CourseLouise Anderson  is a 82 y.o. female who presents with Multiple recent falls. She is brought in by her son tonight at request of her PCP for evaluation of the same. She was found to be in A. fib with RVR. This is a new diagnosis for her. She did not respond initially to rate controlling medications, and was placed on a Cardizem drip some response. Hospitals were called for admission. Patient was admitted to the hospital for further evaluation and treatment. Subsequently her heart rate was controlled. She was switched over to oral Cardizem. Currently her heart rate is stable. She was seen in consultation by cardiology due to her fall she was not felt to be a candidate for full anticoagulation. Patient's TSH was elevated. Her Synthroid was adjusted. She was also noticed to be anemic felt to be due to anemia of chronic disease. Iron studies showed no evidence of iron deficiency. Patient also was noted to have systolic dysfunction but no evidence of CHF. She is very frail was seen by physical therapy and recommended to go to nursing home facility.           Consults  cardiology  Significant Tests:  See full reports for all details    Dg Ribs Unilateral W/chest Right  Result Date: 03/15/2016 CLINICAL DATA:  Pain following fall EXAM: RIGHT RIBS AND CHEST - 3+ VIEW COMPARISON:   Chest radiograph April 03, 2009 FINDINGS: Frontal as well as oblique and cone-down lower rib images obtained. There is interstitial edema with cardiomegaly and mild pulmonary venous hypertension. There is atherosclerotic calcification in the aorta. No adenopathy. No airspace consolidation. Patient has had previous kyphoplasty procedure at L1. There is anterior wedging of several lower thoracic vertebral bodies. There is no pneumothorax or pleural effusion. There is evidence of an old healed fracture of the right third rib laterally. No acute rib fracture evident. IMPRESSION: Old third rib fracture. No acute rib fracture is evident. Patient has had previous kyphoplasty procedure at L1. There is anterior wedging of several lower thoracic vertebral bodies, age uncertain. No pneumothorax or pleural effusion. Evidence a degree of underlying congestive heart failure. There is aortic atherosclerosis. Electronically Signed   By: Bretta Bang III M.D.   On: 03/15/2016 14:54   Ct Head Wo Contrast  Result Date: 03/15/2016 CLINICAL DATA:  Fall, dizziness, facial injury EXAM: CT HEAD WITHOUT CONTRAST TECHNIQUE: Contiguous axial images were obtained from the base of the skull through the vertex without intravenous contrast. COMPARISON:  None. FINDINGS: Brain: No evidence of acute infarction, hemorrhage, hydrocephalus, extra-axial collection or mass lesion/mass effect. Vascular: No hyperdense vessel or unexpected calcification. Skull: No evidence of calvarial fracture. Sinuses/Orbits: The visualized paranasal sinuses are essentially clear. The mastoid air cells are unopacified. Other: Global cortical atrophy.  No ventriculomegaly. Subcortical white matter and periventricular small vessel ischemic changes. Intracranial atherosclerosis. IMPRESSION: No evidence of acute  intracranial abnormality. Atrophy with small vessel ischemic changes. Electronically Signed   By: Charline BillsSriyesh  Krishnan M.D.   On: 03/15/2016 14:37   Dg  Chest Portable 1 View  Result Date: 03/31/2016 CLINICAL DATA:  Status post fall, with concern for chest injury. Initial encounter. EXAM: PORTABLE CHEST 1 VIEW COMPARISON:  Chest radiograph from 03/15/2016 FINDINGS: The lungs are well-aerated. Vascular congestion is noted. Increased interstitial markings raise concern for pulmonary edema. There is no evidence of pleural effusion or pneumothorax. The cardiomediastinal silhouette is enlarged. No acute osseous abnormalities are seen. IMPRESSION: Vascular congestion and cardiomegaly. Increased interstitial markings raise concern for pulmonary edema. No displaced rib fracture seen. Electronically Signed   By: Roanna RaiderJeffery  Chang M.D.   On: 03/31/2016 21:27   Dg Knee Complete 4 Views Right  Result Date: 03/15/2016 CLINICAL DATA:  Pain following fall EXAM: RIGHT KNEE - COMPLETE 4+ VIEW COMPARISON:  None. FINDINGS: Frontal, lateral, and bilateral oblique views were obtained. There is no demonstrable fracture or dislocation. There is a small joint effusion. There is mild generalized joint space narrowing. There is chondrocalcinosis. There is mild spurring medially and laterally. Bones are somewhat osteoporotic. There is atherosclerotic calcification in the distal superficial femoral artery and popliteal artery regions. IMPRESSION: No fracture or dislocation. Joint effusion present. There is a degree of osteoarthritic change with chondrocalcinosis. Chondrocalcinosis may be seen with osteoarthritis but also may be seen with calcium pyrophosphate deposition disease. There are foci of arterial atherosclerosis. Bones appear somewhat osteoporotic. Electronically Signed   By: Bretta BangWilliam  Woodruff III M.D.   On: 03/15/2016 14:51       Today   Subjective:   Catherine HeadsLouise Anderson patient feels okay denies any complaints  Objective:   Blood pressure (!) 163/79, pulse 78, temperature 97.8 F (36.6 C), temperature source Oral, resp. rate (!) 22, height 5\' 3"  (1.6 m), weight 115 lb 9.6  oz (52.4 kg), SpO2 93 %.  .  Intake/Output Summary (Last 24 hours) at 04/03/16 0947 Last data filed at 04/02/16 2100  Gross per 24 hour  Intake                0 ml  Output                0 ml  Net                0 ml    Exam VITAL SIGNS: Blood pressure (!) 163/79, pulse 78, temperature 97.8 F (36.6 C), temperature source Oral, resp. rate (!) 22, height 5\' 3"  (1.6 m), weight 115 lb 9.6 oz (52.4 kg), SpO2 93 %.  GENERAL:  82 y.o.-year-old patient lying in the bed with no acute distress.  EYES: Pupils equal, round, reactive to light and accommodation. No scleral icterus. Extraocular muscles intact.  HEENT: Head atraumatic, normocephalic. Oropharynx and nasopharynx clear.  NECK:  Supple, no jugular venous distention. No thyroid enlargement, no tenderness.  LUNGS: Normal breath sounds bilaterally, no wheezing, rales,rhonchi or crepitation. No use of accessory muscles of respiration.  CARDIOVASCULAR: S1, S2 normal. No murmurs, rubs, or gallops.  ABDOMEN: Soft, nontender, nondistended. Bowel sounds present. No organomegaly or mass.  EXTREMITIES: No pedal edema, cyanosis, or clubbing.  NEUROLOGIC: Cranial nerves II through XII are intact. Muscle strength 5/5 in all extremities. Sensation intact. Gait not checked.  PSYCHIATRIC: The patient is alert and oriented x 3.  SKIN: No obvious rash, lesion, or ulcer.   Data Review     CBC w Diff:  Lab Results  Component  Value Date   WBC 9.2 04/03/2016   HGB 8.0 (L) 04/03/2016   HGB 10.3 (L) 06/17/2014   HCT 23.5 (L) 04/03/2016   HCT 32.6 (L) 06/17/2014   PLT 328 04/03/2016   PLT 242 06/17/2014   LYMPHOPCT 8 03/31/2016   MONOPCT 6 03/31/2016   EOSPCT 3 03/31/2016   BASOPCT 1 03/31/2016   CMP:  Lab Results  Component Value Date   NA 139 04/03/2016   NA 142 06/17/2014   K 4.6 04/03/2016   K 4.5 06/17/2014   CL 111 04/03/2016   CL 111 (H) 06/17/2014   CO2 21 (L) 04/03/2016   CO2 24 06/17/2014   BUN 48 (H) 04/03/2016   BUN 33 (H)  06/17/2014   CREATININE 2.36 (H) 04/03/2016   CREATININE 1.85 (H) 06/17/2014   PROT 7.2 03/31/2016   PROT 8.9 (H) 01/15/2014   ALBUMIN 2.9 (L) 03/31/2016   ALBUMIN 3.7 01/15/2014   BILITOT 0.2 (L) 03/31/2016   BILITOT 0.3 01/15/2014   ALKPHOS 75 03/31/2016   ALKPHOS 94 01/15/2014   AST 24 03/31/2016   AST 27 01/15/2014   ALT 11 (L) 03/31/2016   ALT 17 01/15/2014  .  Micro Results No results found for this or any previous visit (from the past 240 hour(s)).      Code Status Orders        Start     Ordered   03/31/16 2308  Full code  Continuous     03/31/16 2307    Code Status History    Date Active Date Inactive Code Status Order ID Comments User Context   This patient has a current code status but no historical code status.          Follow-up Information    Mickey Farber, MD. Go on 04/10/2016.   Specialty:  Internal Medicine Why:  Hospital Follow-up: APPOINTMENT AT 3:00PM Contact information: [address] [facility] [place] [address] [phone]        Lamar Blinks, MD. Go on 04/17/2016.   Specialty:  Cardiology Why:  systolic dysfuction and afib; , Hospital Follow-up: APPOINTMENT AT 10:15AM Contact information: [address] [facility] [place] [address] [phone]           Discharge Medications     Medication List    TAKE these medications   aspirin EC 325 MG tablet Take 1 tablet (325 mg total) by mouth daily.   cholecalciferol 1000 units tablet Commonly known as:  VITAMIN D Take 2,000 Units by mouth daily.   colchicine 0.6 MG tablet Take 1 tablet (0.6 mg total) by mouth 2 (two) times daily.   diltiazem 120 MG 24 hr capsule Commonly known as:  CARDIZEM CD Take 1 capsule (120 mg total) by mouth daily.   ferrous sulfate 325 (65 FE) MG tablet Take 325 mg by mouth daily with breakfast.   levothyroxine 125 MCG tablet Commonly known as:  SYNTHROID, LEVOTHROID Take 0.5  tablets (62.5 mcg total) by mouth daily before breakfast. Start taking on:  04/04/2016 What changed:  medication strength  how much to take   lisinopril 5 MG tablet Commonly known as:  PRINIVIL,ZESTRIL Take 1 tablet (5 mg total) by mouth daily. What changed:  medication strength  how much to take  when to take this   traMADol 50 MG tablet Commonly known as:  ULTRAM Take 1 tablet (50 mg total) by mouth every 6 (six) hours as needed.  Total Time in preparing paper work, data evaluation and todays exam - 35 minutes  Auburn Bilberry M.D on 04/03/2016 at 9:47 AM  [facility]   Office  [phone]

## 2016-04-04 NOTE — Consult Note (Signed)
Reason for Consult: atrial fibrillation rapid ventricular response Referring Physician:  Dr. Manuella Ghazi hospitalist, primary physician Dr. Clint Bolder Catherine Anderson is an 82 y.o. female.  HPI:  Patient is a 82 year old female multiple falls brought in by his son after she was seen in the pre MD's office patient was found to be in rapid atrial fibrillation which is a new diagnosis for her and was directed to come to the emergency room for evaluation. Patient complains of mild weakness does not necessary noticed the palpitations she has had multiple falls denies black or spells or syncope no nausea vomiting. Patient is a poor anticoagulation candidate history of GI bleed in the past and multiple falls.  Past Medical History:  Diagnosis Date  . CKD (chronic kidney disease), stage IV ([place])   . Gout   . History of GI bleed   . HLD (hyperlipidemia)   . Hypertension   . Thyroid disease     Past Surgical History:  Procedure Laterality Date  . BREAST SURGERY      Family History  Problem Relation Age of Onset  . Aortic aneurysm Mother   . Gout Brother   . Heart disease Brother     Social History:  reports that she has never smoked. She has never used smokeless tobacco. She reports that she does not drink alcohol or use drugs.  Allergies: No Known Allergies  Medications: I have reviewed the patient's current medications.  Results for orders placed or performed during the hospital encounter of 03/31/16 (from the past 48 hour(s))  CBC     Status: Abnormal   Collection Time: 04/03/16  5:04 AM  Result Value Ref Range   WBC 9.2 3.6 - 11.0 K/uL   RBC 2.65 (L) 3.80 - 5.20 MIL/uL   Hemoglobin 8.0 (L) 12.0 - 16.0 g/dL   HCT 23.5 (L) 35.0 - 47.0 %   MCV 88.4 80.0 - 100.0 fL   MCH 30.2 26.0 - 34.0 pg   MCHC 34.1 32.0 - 36.0 g/dL   RDW 15.8 (H) 11.5 - 14.5 %   Platelets 328 150 - 440 K/uL  Basic metabolic panel     Status: Abnormal   Collection Time: 04/03/16  5:04 AM  Result Value Ref Range   Sodium 139 135 - 145 mmol/L   Potassium 4.6 3.5 - 5.1 mmol/L   Chloride 111 101 - 111 mmol/L   CO2 21 (L) 22 - 32 mmol/L   Glucose, Bld 95 65 - 99 mg/dL   BUN 48 (H) 6 - 20 mg/dL   Creatinine, Ser 2.36 (H) 0.44 - 1.00 mg/dL   Calcium 8.7 (L) 8.9 - 10.3 mg/dL   GFR calc non Af Amer 17 (L) >60 mL/min   GFR calc Af Amer 20 (L) >60 mL/min    Comment: (NOTE) The eGFR has been calculated using the CKD EPI equation. This calculation has not been validated in all clinical situations. eGFR's persistently <60 mL/min signify possible Chronic Kidney Disease.    Anion gap 7 5 - 15    No results found.  Review of Systems  Constitutional: Positive for malaise/fatigue.  HENT: Positive for congestion.   Eyes: Negative.   Respiratory: Negative.   Cardiovascular: Positive for palpitations.  Gastrointestinal: Negative.   Genitourinary: Negative.   Musculoskeletal: Negative.   Neurological: Positive for dizziness and weakness.  Endo/Heme/Allergies: Negative.   Psychiatric/Behavioral: Negative.    Blood pressure (!) 165/78, pulse 74, temperature 97.8 F (36.6 C), temperature source Oral, resp. rate 14,  height '5\' 3"'$  (1.6 m), weight 52.4 kg (115 lb 9.6 oz), SpO2 96 %. Physical Exam  Vitals reviewed. Constitutional: She appears well-developed and well-nourished.  HENT:  Head: Atraumatic.  Eyes: Conjunctivae and EOM are normal. Pupils are equal, round, and reactive to light.  Neck: Normal range of motion. Neck supple.  Cardiovascular: S1 normal, S2 normal and normal pulses.  An irregularly irregular rhythm present. Tachycardia present.   Murmur heard.  Systolic murmur is present with a grade of 2/6    Assessment/Plan: Atrial fibrillation rapid ventricular response  abnormal EKG left bundle branch block  chronic renal insufficiency  hypertension  borderline troponins  vertigo  falls  edema  hyperlipidemia  thyroid disease . PLAN  agreed rule out for myocardial infarction  continue  telemetry for evaluation and treatment of arrhythmia  continue rate control  poor anticoagulation candidate  follow up with Nephrology for renal insufficiency  lipid management with diet  colchicine for gout  tramadol for pain  consider echocardiogram  conservative cardiology input at this point  Powell D. 04/04/2016, 2:22 PM

## 2016-04-09 DIAGNOSIS — M6281 Muscle weakness (generalized): Secondary | ICD-10-CM | POA: Diagnosis not present

## 2016-04-09 DIAGNOSIS — I480 Paroxysmal atrial fibrillation: Secondary | ICD-10-CM | POA: Diagnosis not present

## 2016-04-09 DIAGNOSIS — D638 Anemia in other chronic diseases classified elsewhere: Secondary | ICD-10-CM | POA: Diagnosis not present

## 2016-04-09 DIAGNOSIS — I5189 Other ill-defined heart diseases: Secondary | ICD-10-CM | POA: Diagnosis not present

## 2016-04-16 DIAGNOSIS — R198 Other specified symptoms and signs involving the digestive system and abdomen: Secondary | ICD-10-CM | POA: Diagnosis not present

## 2016-04-16 DIAGNOSIS — I10 Essential (primary) hypertension: Secondary | ICD-10-CM | POA: Diagnosis not present

## 2016-04-16 DIAGNOSIS — D508 Other iron deficiency anemias: Secondary | ICD-10-CM | POA: Diagnosis not present

## 2016-04-16 DIAGNOSIS — N184 Chronic kidney disease, stage 4 (severe): Secondary | ICD-10-CM | POA: Diagnosis not present

## 2016-04-17 ENCOUNTER — Encounter: Payer: Self-pay | Admitting: Emergency Medicine

## 2016-04-17 ENCOUNTER — Inpatient Hospital Stay
Admission: EM | Admit: 2016-04-17 | Discharge: 2016-04-21 | DRG: 683 | Disposition: A | Discharge status: Skilled Nursing Facility | Payer: Medicare Other | Attending: Internal Medicine | Admitting: Internal Medicine

## 2016-04-17 DIAGNOSIS — E860 Dehydration: Secondary | ICD-10-CM | POA: Diagnosis present

## 2016-04-17 DIAGNOSIS — Z8249 Family history of ischemic heart disease and other diseases of the circulatory system: Secondary | ICD-10-CM | POA: Diagnosis not present

## 2016-04-17 DIAGNOSIS — Z79899 Other long term (current) drug therapy: Secondary | ICD-10-CM | POA: Diagnosis not present

## 2016-04-17 DIAGNOSIS — E538 Deficiency of other specified B group vitamins: Secondary | ICD-10-CM | POA: Diagnosis present

## 2016-04-17 DIAGNOSIS — Z8711 Personal history of peptic ulcer disease: Secondary | ICD-10-CM

## 2016-04-17 DIAGNOSIS — R7989 Other specified abnormal findings of blood chemistry: Secondary | ICD-10-CM | POA: Diagnosis present

## 2016-04-17 DIAGNOSIS — E039 Hypothyroidism, unspecified: Secondary | ICD-10-CM | POA: Diagnosis present

## 2016-04-17 DIAGNOSIS — Z9889 Other specified postprocedural states: Secondary | ICD-10-CM

## 2016-04-17 DIAGNOSIS — Z7982 Long term (current) use of aspirin: Secondary | ICD-10-CM | POA: Diagnosis not present

## 2016-04-17 DIAGNOSIS — M810 Age-related osteoporosis without current pathological fracture: Secondary | ICD-10-CM | POA: Diagnosis present

## 2016-04-17 DIAGNOSIS — A0472 Enterocolitis due to Clostridium difficile, not specified as recurrent: Secondary | ICD-10-CM | POA: Diagnosis present

## 2016-04-17 DIAGNOSIS — I083 Combined rheumatic disorders of mitral, aortic and tricuspid valves: Secondary | ICD-10-CM | POA: Diagnosis not present

## 2016-04-17 DIAGNOSIS — E785 Hyperlipidemia, unspecified: Secondary | ICD-10-CM | POA: Diagnosis present

## 2016-04-17 DIAGNOSIS — R109 Unspecified abdominal pain: Secondary | ICD-10-CM | POA: Diagnosis not present

## 2016-04-17 DIAGNOSIS — N189 Chronic kidney disease, unspecified: Secondary | ICD-10-CM

## 2016-04-17 DIAGNOSIS — M109 Gout, unspecified: Secondary | ICD-10-CM | POA: Diagnosis present

## 2016-04-17 DIAGNOSIS — I10 Essential (primary) hypertension: Secondary | ICD-10-CM | POA: Diagnosis not present

## 2016-04-17 DIAGNOSIS — E1122 Type 2 diabetes mellitus with diabetic chronic kidney disease: Secondary | ICD-10-CM | POA: Diagnosis present

## 2016-04-17 DIAGNOSIS — E872 Acidosis: Secondary | ICD-10-CM | POA: Diagnosis present

## 2016-04-17 DIAGNOSIS — I129 Hypertensive chronic kidney disease with stage 1 through stage 4 chronic kidney disease, or unspecified chronic kidney disease: Secondary | ICD-10-CM | POA: Diagnosis present

## 2016-04-17 DIAGNOSIS — N179 Acute kidney failure, unspecified: Principal | ICD-10-CM | POA: Diagnosis present

## 2016-04-17 DIAGNOSIS — D631 Anemia in chronic kidney disease: Secondary | ICD-10-CM | POA: Diagnosis present

## 2016-04-17 DIAGNOSIS — N184 Chronic kidney disease, stage 4 (severe): Secondary | ICD-10-CM | POA: Diagnosis present

## 2016-04-17 DIAGNOSIS — I480 Paroxysmal atrial fibrillation: Secondary | ICD-10-CM | POA: Diagnosis not present

## 2016-04-17 DIAGNOSIS — N261 Atrophy of kidney (terminal): Secondary | ICD-10-CM | POA: Diagnosis present

## 2016-04-17 DIAGNOSIS — M6281 Muscle weakness (generalized): Secondary | ICD-10-CM | POA: Diagnosis not present

## 2016-04-17 DIAGNOSIS — R197 Diarrhea, unspecified: Secondary | ICD-10-CM | POA: Diagnosis not present

## 2016-04-17 LAB — CBC
HEMATOCRIT: 31 % — AB (ref 35.0–47.0)
Hemoglobin: 10.2 g/dL — ABNORMAL LOW (ref 12.0–16.0)
MCH: 29.7 pg (ref 26.0–34.0)
MCHC: 32.7 g/dL (ref 32.0–36.0)
MCV: 90.8 fL (ref 80.0–100.0)
PLATELETS: 175 10*3/uL (ref 150–440)
RBC: 3.41 MIL/uL — ABNORMAL LOW (ref 3.80–5.20)
RDW: 16.9 % — AB (ref 11.5–14.5)
WBC: 5.1 10*3/uL (ref 3.6–11.0)

## 2016-04-17 LAB — COMPREHENSIVE METABOLIC PANEL
ALBUMIN: 3 g/dL — AB (ref 3.5–5.0)
ALK PHOS: 117 U/L (ref 38–126)
ALT: 48 U/L (ref 14–54)
AST: 61 U/L — AB (ref 15–41)
Anion gap: 9 (ref 5–15)
BILIRUBIN TOTAL: 0.2 mg/dL — AB (ref 0.3–1.2)
BUN: 62 mg/dL — AB (ref 6–20)
CALCIUM: 8.7 mg/dL — AB (ref 8.9–10.3)
CO2: 17 mmol/L — ABNORMAL LOW (ref 22–32)
CREATININE: 3.99 mg/dL — AB (ref 0.44–1.00)
Chloride: 112 mmol/L — ABNORMAL HIGH (ref 101–111)
GFR calc Af Amer: 10 mL/min — ABNORMAL LOW (ref >60)
GFR, EST NON AFRICAN AMERICAN: 9 mL/min — AB (ref >60)
GLUCOSE: 109 mg/dL — AB (ref 65–99)
Potassium: 4.3 mmol/L (ref 3.5–5.1)
Sodium: 138 mmol/L (ref 135–145)
TOTAL PROTEIN: 7 g/dL (ref 6.5–8.1)

## 2016-04-17 LAB — LIPASE, BLOOD: Lipase: 38 U/L (ref 11–51)

## 2016-04-17 MED ORDER — VITAMIN D 1000 UNITS PO TABS
2000.0000 [IU] | ORAL_TABLET | Freq: Every day | ORAL | Status: DC
  Administered 2016-04-18 – 2016-04-21 (×3): 2000 [IU] via ORAL
  Filled 2016-04-17 (×4): qty 2

## 2016-04-17 MED ORDER — TRAMADOL HCL 50 MG PO TABS: 50.0000 mg | ORAL_TABLET | Freq: Two times a day (BID) | ORAL | Status: DC | PRN

## 2016-04-17 MED ORDER — ONDANSETRON HCL 4 MG/2ML IJ SOLN: 4.0000 mg | Freq: Four times a day (QID) | INTRAMUSCULAR | Status: DC | PRN

## 2016-04-17 MED ORDER — COLCHICINE 0.6 MG PO TABS
0.3000 mg | ORAL_TABLET | Freq: Every day | ORAL | Status: DC
  Administered 2016-04-18 – 2016-04-21 (×4): 0.3 mg via ORAL
  Filled 2016-04-17 (×4): qty 1

## 2016-04-17 MED ORDER — FERROUS SULFATE 325 (65 FE) MG PO TABS
325.0000 mg | ORAL_TABLET | Freq: Every day | ORAL | Status: DC
  Administered 2016-04-18 – 2016-04-21 (×4): 325 mg via ORAL
  Filled 2016-04-17 (×4): qty 1

## 2016-04-17 MED ORDER — ONDANSETRON HCL 4 MG PO TABS: 4.0000 mg | ORAL_TABLET | Freq: Four times a day (QID) | ORAL | Status: DC | PRN

## 2016-04-17 MED ORDER — ACETAMINOPHEN 650 MG RE SUPP: 650.0000 mg | Freq: Four times a day (QID) | RECTAL | Status: DC | PRN

## 2016-04-17 MED ORDER — DILTIAZEM HCL ER COATED BEADS 120 MG PO CP24
120.0000 mg | ORAL_CAPSULE | Freq: Every day | ORAL | Status: DC
  Administered 2016-04-18 – 2016-04-21 (×4): 120 mg via ORAL
  Filled 2016-04-17 (×4): qty 1

## 2016-04-17 MED ORDER — ASPIRIN EC 325 MG PO TBEC
325.0000 mg | DELAYED_RELEASE_TABLET | Freq: Every day | ORAL | Status: DC
  Administered 2016-04-18 – 2016-04-21 (×4): 325 mg via ORAL
  Filled 2016-04-17 (×4): qty 1

## 2016-04-17 MED ORDER — ACETAMINOPHEN 325 MG PO TABS: 650.0000 mg | ORAL_TABLET | Freq: Four times a day (QID) | ORAL | Status: DC | PRN

## 2016-04-17 MED ORDER — LEVOTHYROXINE SODIUM 125 MCG PO TABS
62.5000 ug | ORAL_TABLET | Freq: Every day | ORAL | Status: DC
  Administered 2016-04-18 – 2016-04-21 (×4): 62.5 ug via ORAL
  Filled 2016-04-17 (×4): qty 1

## 2016-04-17 MED ORDER — SODIUM CHLORIDE 0.9 % IV SOLN
INTRAVENOUS | Status: DC
  Administered 2016-04-17 – 2016-04-19 (×4): via INTRAVENOUS

## 2016-04-17 MED ORDER — HEPARIN SODIUM (PORCINE) 5000 UNIT/ML IJ SOLN
5000.0000 [IU] | Freq: Three times a day (TID) | INTRAMUSCULAR | Status: DC
  Administered 2016-04-18 – 2016-04-21 (×10): 5000 [IU] via SUBCUTANEOUS
  Filled 2016-04-17 (×4): qty 1
  Filled 2016-04-17: qty 2
  Filled 2016-04-17 (×4): qty 1

## 2016-04-17 MED ORDER — SODIUM CHLORIDE 0.9 % IV SOLN
Freq: Once | INTRAVENOUS | Status: AC
  Administered 2016-04-17: 18:00:00 via INTRAVENOUS

## 2016-04-17 NOTE — ED Provider Notes (Signed)
[facility] Emergency Department Provider Note  Time seen: 6:08 PM  I have reviewed the triage vital signs and the nursing notes.   HISTORY  Chief Complaint Diarrhea and Abnormal Lab    HPI Catherine Anderson is a 82 y.o. female with a past medical history of chronic kidney disease, hypertension, hyperlipidemia who presents to the emergency department for worsening kidney function and dehydration. According to the patient and her granddaughter the patient has been having diarrhea for the past 4 days. Her last episode was this morning. She has been staying in [facility] for the past 2 days but they state her kidney function has declined in the center to the emergency department for further treatment and evaluation. Patient denies any abdominal pain. Denies any fever. States her last episode of diarrhea was earlier this morning.  Past Medical History:  Diagnosis Date  . CKD (chronic kidney disease), stage IV (HCC)   . Gout   . History of GI bleed   . HLD (hyperlipidemia)   . Hypertension   . Thyroid disease     Patient Active Problem List   Diagnosis Date Noted  . Atrial fibrillation with RVR (HCC) 03/31/2016  . HTN (hypertension) 03/31/2016  . HLD (hyperlipidemia) 03/31/2016  . CKD (chronic kidney disease), stage IV (HCC) 03/31/2016  . Hypothyroidism 03/31/2016    Past Surgical History:  Procedure Laterality Date  . BREAST SURGERY      Prior to Admission medications   Medication Sig Start Date End Date Taking? Authorizing Provider  aspirin EC 325 MG tablet Take 1 tablet (325 mg total) by mouth daily. 04/03/16   Auburn BilberryShreyang Patel, MD  cholecalciferol (VITAMIN D) 1000 units tablet Take 2,000 Units by mouth daily.    Historical Provider, MD  colchicine 0.6 MG tablet Take 1 tablet (0.6 mg total) by mouth 2 (two) times daily. 12/29/15 01/04/17  Jennye MoccasinBrian S Quigley, MD  diltiazem (CARDIZEM CD) 120 MG 24 hr capsule Take 1 capsule (120 mg  total) by mouth daily. 04/03/16   Auburn BilberryShreyang Patel, MD  ferrous sulfate 325 (65 FE) MG tablet Take 325 mg by mouth daily with breakfast.    Historical Provider, MD  levothyroxine (SYNTHROID, LEVOTHROID) 125 MCG tablet Take 0.5 tablets (62.5 mcg total) by mouth daily before breakfast. 04/04/16   Auburn BilberryShreyang Patel, MD  lisinopril (PRINIVIL,ZESTRIL) 5 MG tablet Take 1 tablet (5 mg total) by mouth daily. 04/03/16   Auburn BilberryShreyang Patel, MD  traMADol (ULTRAM) 50 MG tablet Take 1 tablet (50 mg total) by mouth every 6 (six) hours as needed. 03/15/16 03/15/17  Emily FilbertJonathan E Williams, MD    No Known Allergies  Family History  Problem Relation Age of Onset  . Aortic aneurysm Mother   . Gout Brother   . Heart disease Brother     Social History Social History  Substance Use Topics  . Smoking status: Never Smoker  . Smokeless tobacco: Never Used  . Alcohol use No    Review of Systems Constitutional: Negative for fever. Cardiovascular: Negative for chest pain. Respiratory: Negative for shortness of breath. Gastrointestinal: Negative for abdominal pain. Positive for diarrhea. Neurological: Negative for headache 10-point ROS otherwise negative.  ____________________________________________   PHYSICAL EXAM:  VITAL SIGNS: ED Triage Vitals  Enc Vitals Group     BP 04/17/16 1652 140/71     Pulse Rate 04/17/16 1652 69     Resp 04/17/16 1652 18     Temp 04/17/16 1652 97.9 F (36.6 C)  Temp Source 04/17/16 1652 Oral     SpO2 04/17/16 1652 100 %     Weight 04/17/16 1653 98 lb (44.5 kg)     Height 04/17/16 1653 5\' 4"  (1.626 m)     Head Circumference --      Peak Flow --      Pain Score --      Pain Loc --      Pain Edu? --      Excl. in GC? --     Constitutional: Alert and oriented. Well appearing and in no distress. Eyes: Normal exam ENT   Head: Normocephalic and atraumatic   Mouth/Throat: Mucous membranes are moist. Cardiovascular: Normal rate, regular rhythm. No murmur Respiratory:  Normal respiratory effort without tachypnea nor retractions. Breath sounds are clear  Gastrointestinal: Soft and nontender. No distention.  Musculoskeletal: Nontender with normal range of motion in all extremities.  Neurologic:  Normal speech and language. No gross focal neurologic deficits  Skin:  Skin is warm, dry and intact.  Psychiatric: Mood and affect are normal.   ____________________________________________    INITIAL IMPRESSION / ASSESSMENT AND PLAN / ED COURSE  Pertinent labs & imaging results that were available during my care of the patient were reviewed by me and considered in my medical decision making (see chart for details).  The patient has been sensitive the emergency department today after blood work showed the patient's kidney function has deep crease considerably. Patient states she has been experiencing diarrhea for the past 4 days, the last episode was this morning. Patient likely experiencing dehydration due to diarrhea causing worsening kidney dysfunction. Patient's labs in the emergency department showed creatinine of 4, baseline is around 2.2. Given the patient's increased weakness, diarrhea with acute on chronic renal insufficiency will admit to the hospital for continued IV hydration and further treatment.  ____________________________________________   FINAL CLINICAL IMPRESSION(S) / ED DIAGNOSES  Acute on chronic renal insufficiency Dehydration Diarrhea    Minna Antis, MD 04/17/16 1811

## 2016-04-17 NOTE — H&P (Signed)
[facility]   PATIENT NAME: Catherine Anderson    MR#:  [id number]  DATE OF BIRTH:  11-05-1922  DATE OF ADMISSION:  04/17/2016  PRIMARY CARE PHYSICIAN: Mickey FarberHIES, DAVID, MD   REQUESTING/REFERRING PHYSICIAN: Dr. Minna AntisKevin Paduchowski  CHIEF COMPLAINT:   Chief Complaint  Patient presents with  . Diarrhea  . Abnormal Lab    HISTORY OF PRESENT ILLNESS:  Catherine HeadsLouise Anderson  is a 82 y.o. female with a known history of CK D stage IV with baseline creatinine of 2.4, anemia, hypertension, hypothyroidism, osteoporosis, and diabetes mellitus presents [facility] secondary to weakness, abnormal labs and diarrhea. Patient states that her diarrhea started about 3 days ago, associated with cramping abdominal pain with each loose stool. Also has complaint of nausea but no vomiting. Last loose stool was this morning. Denies any fevers or chills. Feels extremely fatigued and weak due to that. She has been at [facility] for almost 2 weeks as she was sent to rehabilitation after a fall. She has been ambulating with a walker. Her vitals are stable. Blood work done at the rehabilitation shows renal function worsening, with a creatinine of 4.1. So she is sent to the hospital.  PAST MEDICAL HISTORY:   Past Medical History:  Diagnosis Date  . CKD (chronic kidney disease), stage IV (HCC)   . Gout   . History of GI bleed   . HLD (hyperlipidemia)   . Hypertension   . Thyroid disease     PAST SURGICAL HISTORY:   Past Surgical History:  Procedure Laterality Date  . BREAST SURGERY      SOCIAL HISTORY:   Social History  Substance Use Topics  . Smoking status: Never Smoker  . Smokeless tobacco: Never Used  . Alcohol use No    FAMILY HISTORY:   Family History  Problem Relation Age of Onset  . Aortic aneurysm Mother   . Gout Brother   . Heart disease Brother     DRUG ALLERGIES:  No Known Allergies  REVIEW OF SYSTEMS:   Review of Systems    Constitutional: Positive for weight loss. Negative for chills, fever and malaise/fatigue.  HENT: Positive for hearing loss. Negative for ear discharge, ear pain and nosebleeds.   Eyes: Negative for blurred vision, double vision and photophobia.  Respiratory: Negative for cough, hemoptysis, shortness of breath and wheezing.   Cardiovascular: Negative for chest pain, palpitations, orthopnea and leg swelling.  Gastrointestinal: Positive for abdominal pain, diarrhea and nausea. Negative for constipation, heartburn, melena and vomiting.  Genitourinary: Negative for dysuria, frequency and urgency.  Musculoskeletal: Negative for back pain, myalgias and neck pain.  Skin: Negative for rash.  Neurological: Negative for dizziness, tingling, sensory change, speech change, focal weakness and headaches.  Endo/Heme/Allergies: Does not bruise/bleed easily.  Psychiatric/Behavioral: Negative for depression.    MEDICATIONS AT HOME:   Prior to Admission medications   Medication Sig Start Date End Date Taking? Authorizing Provider  aspirin EC 325 MG tablet Take 1 tablet (325 mg total) by mouth daily. 04/03/16  Yes Auburn BilberryShreyang Patel, MD  cholecalciferol (VITAMIN D) 1000 units tablet Take 2,000 Units by mouth daily.   Yes Historical Provider, MD  colchicine 0.6 MG tablet Take 1 tablet (0.6 mg total) by mouth 2 (two) times daily. 12/29/15 01/04/17 Yes Jennye MoccasinBrian S Quigley, MD  diltiazem (CARDIZEM CD) 120 MG 24 hr capsule Take 1 capsule (120 mg total) by mouth daily. 04/03/16  Yes Auburn BilberryShreyang Patel, MD  ferrous  sulfate 325 (65 FE) MG tablet Take 325 mg by mouth daily with breakfast.   Yes Historical Provider, MD  levothyroxine (SYNTHROID, LEVOTHROID) 125 MCG tablet Take 0.5 tablets (62.5 mcg total) by mouth daily before breakfast. 04/04/16  Yes Auburn Bilberry, MD  lisinopril (PRINIVIL,ZESTRIL) 5 MG tablet Take 1 tablet (5 mg total) by mouth daily. 04/03/16  Yes Auburn Bilberry, MD  traMADol (ULTRAM) 50 MG tablet Take 1 tablet  (50 mg total) by mouth every 6 (six) hours as needed. 03/15/16 03/15/17 Yes Emily Filbert, MD      VITAL SIGNS:  Blood pressure (!) 144/70, pulse 68, temperature 97.9 F (36.6 C), temperature source Oral, resp. rate 16, height 5\' 4"  (1.626 m), weight 44.5 kg (98 lb), SpO2 100 %.  PHYSICAL EXAMINATION:   Physical Exam  GENERAL:  82 y.o.-year-old elderly patient lying in the bed with no acute distress. Hard of hearing. EYES: Pupils equal, round, reactive to light and accommodation. No scleral icterus. Extraocular muscles intact.  HEENT: Head atraumatic, normocephalic. Oropharynx and nasopharynx clear.  NECK:  Supple, no jugular venous distention. No thyroid enlargement, no tenderness.  LUNGS: Normal breath sounds bilaterally, no wheezing, rales,rhonchi or crepitation. No use of accessory muscles of respiration.  CARDIOVASCULAR: S1, S2 normal. No  rubs, or gallops. 3/6 systolic murmur present. ABDOMEN: Soft, nontender, nondistended. Bowel sounds present. No organomegaly or mass.  EXTREMITIES: No pedal edema, cyanosis, or clubbing.  NEUROLOGIC: Cranial nerves II through XII are intact. Muscle strength 5/5 in all extremities. Sensation intact. Gait not checked.  PSYCHIATRIC: The patient is alert and oriented x 3.  SKIN: No obvious rash, lesion, or ulcer.   LABORATORY PANEL:   CBC  Recent Labs Lab 04/17/16 1717  WBC 5.1  HGB 10.2*  HCT 31.0*  PLT 175   ------------------------------------------------------------------------------------------------------------------  Chemistries   Recent Labs Lab 04/17/16 1717  NA 138  K 4.3  CL 112*  CO2 17*  GLUCOSE 109*  BUN 62*  CREATININE 3.99*  CALCIUM 8.7*  AST 61*  ALT 48  ALKPHOS 117  BILITOT 0.2*   ------------------------------------------------------------------------------------------------------------------  Cardiac Enzymes No results for input(s): TROPONINI in the last 168  hours. ------------------------------------------------------------------------------------------------------------------  RADIOLOGY:  No results found.  EKG:   Orders placed or performed during the hospital encounter of 03/31/16  . ED EKG  . ED EKG  . EKG 12-Lead  . EKG 12-Lead  . EKG    IMPRESSION AND PLAN:    Catherine Anderson  is a 82 y.o. female with a known history of CK D stage IV with baseline creatinine of 2.4, anemia, hypertension, hypothyroidism, osteoporosis, and diabetes mellitus presents from [facility] secondary to weakness, abnormal labs and diarrhea.  #1 acute renal failure on CK D stage IV-creatinine worsened from 2.4 to 3.99 here - likely prerenal from diarrhea. Hold lisinopril. Gentle hydration. -Renal ultrasound and nephrology consult. -Monitor renal function and avoid nephrotoxins  #2 chronic anemia-Baseline hemoglobin is 9, currently elevated likely from hemoconcentration. -Sense on IV fluids, dropped is expected. Continue to monitor Continue oral iron supplements.  #3 hypertension-on Cardizem orally.  #4 hypothyroidism-continue Synthroid  #5 DVT prophylaxis-on subcutaneous heparin  Physical therapy and social worker consults for return back to [facility]    All the records are reviewed and case discussed with ED provider. Management plans discussed with the patient, family and they are in agreement.  CODE STATUS: Full Code  TOTAL TIME TAKING CARE OF THIS PATIENT: 50 minutes.    Enid Baas M.D on  04/17/2016 at 8:59 PM  Between 7am to 6pm - Pager - [phone]  After 6pm go to [email or web address] - password [email or web address]  [facility]  Office  [phone]  CC: Primary care physician; Mickey Farber, MD

## 2016-04-17 NOTE — ED Triage Notes (Signed)
Patient presents to the ED via EMS [facility].  Patient is alert and oriented x 4 and was sent to the ED due to labs that showed worsening condition of acute renal failure.  Patient has had diarrhea x 2 days which was why she had additional lab work performed today.  Patient states, "I think they've been giving me pills that they aren't supposed to give me and that's why food has been running through me."  Patient denies pain at this time and is in no obvious distress.

## 2016-04-18 ENCOUNTER — Inpatient Hospital Stay: Payer: Medicare Other

## 2016-04-18 LAB — CBC
HEMATOCRIT: 28.7 % — AB (ref 35.0–47.0)
HEMOGLOBIN: 9.2 g/dL — AB (ref 12.0–16.0)
MCH: 29.4 pg (ref 26.0–34.0)
MCHC: 32.1 g/dL (ref 32.0–36.0)
MCV: 91.8 fL (ref 80.0–100.0)
Platelets: 169 10*3/uL (ref 150–440)
RBC: 3.12 MIL/uL — ABNORMAL LOW (ref 3.80–5.20)
RDW: 16.7 % — AB (ref 11.5–14.5)
WBC: 5.4 10*3/uL (ref 3.6–11.0)

## 2016-04-18 LAB — BASIC METABOLIC PANEL
ANION GAP: 7 (ref 5–15)
BUN: 63 mg/dL — ABNORMAL HIGH (ref 6–20)
CO2: 17 mmol/L — ABNORMAL LOW (ref 22–32)
Calcium: 8.7 mg/dL — ABNORMAL LOW (ref 8.9–10.3)
Chloride: 115 mmol/L — ABNORMAL HIGH (ref 101–111)
Creatinine, Ser: 3.75 mg/dL — ABNORMAL HIGH (ref 0.44–1.00)
GFR calc Af Amer: 11 mL/min — ABNORMAL LOW (ref >60)
GFR, EST NON AFRICAN AMERICAN: 10 mL/min — AB (ref >60)
Glucose, Bld: 86 mg/dL (ref 65–99)
POTASSIUM: 4.2 mmol/L (ref 3.5–5.1)
SODIUM: 139 mmol/L (ref 135–145)

## 2016-04-18 LAB — C DIFFICILE QUICK SCREEN W PCR REFLEX
C DIFFICLE (CDIFF) ANTIGEN: POSITIVE — AB
C Diff toxin: NEGATIVE

## 2016-04-18 LAB — CLOSTRIDIUM DIFFICILE BY PCR: CDIFFPCR: POSITIVE — AB

## 2016-04-18 MED ORDER — SODIUM BICARBONATE 650 MG PO TABS
650.0000 mg | ORAL_TABLET | Freq: Two times a day (BID) | ORAL | Status: DC
  Administered 2016-04-18 – 2016-04-21 (×6): 650 mg via ORAL
  Filled 2016-04-18 (×7): qty 1

## 2016-04-18 MED ORDER — HYDRALAZINE HCL 20 MG/ML IJ SOLN: 10.0000 mg | Freq: Four times a day (QID) | INTRAMUSCULAR | Status: DC | PRN

## 2016-04-18 MED ORDER — HYDRALAZINE HCL 25 MG PO TABS
25.0000 mg | ORAL_TABLET | Freq: Three times a day (TID) | ORAL | Status: DC
Start: 2016-04-18 — End: 2016-04-21
  Administered 2016-04-18 – 2016-04-21 (×9): 25 mg via ORAL
  Filled 2016-04-18 (×9): qty 1

## 2016-04-18 NOTE — Progress Notes (Signed)
Per Dr. Imogene Burnhen okay to cancel urinalysis as pt is incontinent.

## 2016-04-18 NOTE — Clinical Social Work Note (Signed)
CSW received consult for possible new SNF. Patient was sleeping when the CSW visited. The CSW will revisit tomorrow for assessment.  Argentina PonderKaren Martha Azarya Oconnell, MSW, LCSW-A [phone]

## 2016-04-18 NOTE — Evaluation (Signed)
Physical Therapy Evaluation Patient Details Name: Jeanette CapriceLouise M Murfin MRN: [id number] DOB: [birth year] Today's Date: 04/18/2016   History of Present Illness  Patient is a 82 y/o female that presents with diahrrea at rehab facility. She was noted to have elevated creatinine from baseline as well.   Clinical Impression  Patient recently discharged from this facility to a rehab facility, returns with diahrrea and elevated creatinine. She has made significant improvement in gait distance tolerated from previous admission, however she continues to report fatigue with short distance ambulation ~200'. She demonstrates high falls risk with scissoring gait and 24" 5x sit to stand. She is also noted to have significant sarcopenia of her LE musculature, particularly in her thighs, likely contributing to her reports of fatigue and narrow base of support in gait. Would recommend she return to SNF to increase her independence with mobility prior to returning to her home where she lives alone.     Follow Up Recommendations SNF    Equipment Recommendations  Rolling walker with 5" wheels    Recommendations for Other Services       Precautions / Restrictions Precautions Precautions: Fall Restrictions Weight Bearing Restrictions: No      Mobility  Bed Mobility Overal bed mobility: Modified Independent             General bed mobility comments: Patient uses bed rail to complete with HOB elevated, prolonged time to complete.   Transfers Overall transfer level: Needs assistance Equipment used: Rolling walker (2 wheeled) Transfers: Sit to/from Stand Sit to Stand: Min guard         General transfer comment: Requires excessive use of UEs to complete transfer, no loss of balance.   Ambulation/Gait Ambulation/Gait assistance: Min guard Ambulation Distance (Feet): 200 Feet Assistive device: Rolling walker (2 wheeled) Gait Pattern/deviations: Step-through pattern;Scissoring;Narrow base of  support;Trunk flexed   Gait velocity interpretation: at or above normal speed for age/gender General Gait Details: Patient noted to have scissoring gait with RW, appropriate speed, no buckling or loss of balance noted.   Stairs            Wheelchair Mobility    Modified Rankin (Stroke Patients Only)       Balance Overall balance assessment: History of Falls;Needs assistance Sitting-balance support: Bilateral upper extremity supported Sitting balance-Leahy Scale: Fair   Postural control: Posterior lean Standing balance support: Bilateral upper extremity supported Standing balance-Leahy Scale: Fair Standing balance comment: Uses RW      Tandem Stance - Right Leg: 0 (LOB with attempt for tandem stance)                       Pertinent Vitals/Pain Pain Assessment: No/denies pain    Home Living Family/patient expects to be discharged to:: Skilled nursing facility Living Arrangements: Alone Available Help at Discharge: Family;Available PRN/intermittently Type of Home: House Home Access: Stairs to enter Entrance Stairs-Rails: Left Entrance Stairs-Number of Steps: 3 Home Layout: One level Home Equipment: Walker - 2 wheels;Cane - single point;Shower seat      Prior Function Level of Independence: Independent with assistive device(s)         Comments: Patient was living alone with family checking on her daily. She reports she drives, does her own cooking, cleaning, and dressing. Uses a RW at times, has had several recent falls.      Hand Dominance        Extremity/Trunk Assessment   Upper Extremity Assessment: Generalized weakness  Lower Extremity Assessment: Generalized weakness      Cervical / Trunk Assessment: Kyphotic  Communication   Communication: HOH  Cognition Arousal/Alertness: Awake/alert Behavior During Therapy: WFL for tasks assessed/performed Overall Cognitive Status: Within Functional Limits for tasks assessed                       General Comments      Exercises General Exercises - Lower Extremity Long Arc Quad: AROM;Both;20 reps Heel Slides: AROM;Both;10 reps Straight Leg Raises: AROM;10 reps;Both Other Exercises Other Exercises: 5x sit to stand x 2 sets (24 seconds with significant use of UEs)    Assessment/Plan    PT Assessment Patient needs continued PT services  PT Problem List Decreased strength;Decreased range of motion;Decreased activity tolerance;Decreased balance;Decreased mobility;Decreased knowledge of use of DME;Decreased safety awareness;Pain          PT Treatment Interventions DME instruction;Gait training;Stair training;Functional mobility training;Therapeutic activities;Therapeutic exercise;Balance training;Neuromuscular re-education;Patient/family education;Modalities    PT Goals (Current goals can be found in the Care Plan section)  Acute Rehab PT Goals Patient Stated Goal: To return home  PT Goal Formulation: With patient Time For Goal Achievement: 05/02/16 Potential to Achieve Goals: Good    Frequency Min 2X/week   Barriers to discharge Decreased caregiver support;Inaccessible home environment Patient lives alone and reported fatigue after short distance of exertion.     Co-evaluation               End of Session Equipment Utilized During Treatment: Gait belt Activity Tolerance: Patient limited by fatigue;Patient tolerated treatment well Patient left: in chair;with call bell/phone within reach;with chair alarm set Nurse Communication: Mobility status         Time: [id number] PT Time Calculation (min) (ACUTE ONLY): 20 min   Charges:   PT Evaluation $PT Eval Moderate Complexity: 1 Procedure     PT G Codes:       Kerin RansomPatrick A Charie Pinkus, PT, DPT    04/18/2016, 5:29 PM

## 2016-04-18 NOTE — Progress Notes (Signed)
[facility] -  [facility]   PATIENT NAME: Catherine Anderson Gutkowski    [facility]  [id number]  DATE OF BIRTH:  Nov 14, 1922  SUBJECTIVE:  CHIEF COMPLAINT:   Chief Complaint  Patient presents with  . Diarrhea  . Abnormal Lab   Loose stool 3 times this morning. REVIEW OF SYSTEMS:  Review of Systems  Constitutional: Positive for malaise/fatigue. Negative for chills and fever.  HENT: Negative for sore throat.   Eyes: Negative for blurred vision and double vision.  Respiratory: Negative for cough, shortness of breath and stridor.   Cardiovascular: Negative for chest pain and leg swelling.  Gastrointestinal: Positive for diarrhea. Negative for abdominal pain, blood in stool, melena, nausea and vomiting.  Genitourinary: Negative for dysuria, frequency and urgency.  Musculoskeletal: Negative for joint pain.  Skin: Negative for itching and rash.  Neurological: Negative for dizziness, focal weakness and loss of consciousness.  Psychiatric/Behavioral: Negative for depression. The patient is not nervous/anxious.     DRUG ALLERGIES:  No Known Allergies VITALS:  Blood pressure (!) 163/72, pulse 69, temperature 98.2 F (36.8 C), temperature source Oral, resp. rate 20, height 4\' 11"  (1.499 m), weight 104 lb 14.4 oz (47.6 kg), SpO2 100 %. PHYSICAL EXAMINATION:  Physical Exam  Constitutional: She is oriented to person, place, and time. No distress.  HENT:  Head: Normocephalic.  Mouth/Throat: Oropharynx is clear and moist.  Eyes: Conjunctivae and EOM are normal. No scleral icterus.  Neck: Normal range of motion. Neck supple. No JVD present. No tracheal deviation present.  Cardiovascular: Normal rate, regular rhythm and normal heart sounds.  Exam reveals no gallop.   No murmur heard. Pulmonary/Chest: Effort normal and breath sounds normal. No respiratory distress. She has no wheezes. She has no rales.  Abdominal: Soft. Bowel sounds are normal. She exhibits no distension. There is  no tenderness.  Musculoskeletal: Normal range of motion. She exhibits no edema or tenderness.  Neurological: She is alert and oriented to person, place, and time. No cranial nerve deficit.  Skin: No rash noted. No erythema.  Psychiatric: Affect normal.   LABORATORY PANEL:   CBC  Recent Labs Lab 04/18/16 0458  WBC 5.4  HGB 9.2*  HCT 28.7*  PLT 169   ------------------------------------------------------------------------------------------------------------------ Chemistries   Recent Labs Lab 04/17/16 1717 04/18/16 0458  NA 138 139  K 4.3 4.2  CL 112* 115*  CO2 17* 17*  GLUCOSE 109* 86  BUN 62* 63*  CREATININE 3.99* 3.75*  CALCIUM 8.7* 8.7*  AST 61*  --   ALT 48  --   ALKPHOS 117  --   BILITOT 0.2*  --    RADIOLOGY:  [place] Renal  Result Date: 04/18/2016 CLINICAL DATA:  Acute renal failure. EXAM: RENAL / URINARY TRACT ULTRASOUND COMPLETE COMPARISON:  None. FINDINGS: Right Kidney: Length: 9.0 cm. Marked diffuse cortical thinning with prominent renal sinus fat. The renal cortex is mildly echogenic. 1.4 cm upper pole cyst. No hydronephrosis. Left Kidney: Length: 8.3 cm. Moderate diffuse cortical thinning with prominent renal sinus fat. The cortex is mildly echogenic. No hydronephrosis. Bladder: Mild dependent debris.  Otherwise, unremarkable. IMPRESSION: 1. Diffuse bilateral renal cortical atrophy. 2. Bilateral renal cortex increased echogenicity compatible with medical renal disease. 3. No hydronephrosis. 4. Small amount of debris in the bladder. Electronically Signed   By: Beckie SaltsSteven  Reid M.D.   On: 04/18/2016 09:47   ASSESSMENT AND PLAN:   Catherine Anderson Autrey  is a 82 y.o. female with a known history of CK D stage IV  with baseline creatinine of 2.4, anemia, hypertension, hypothyroidism, osteoporosis, and diabetes mellitus [facility] to weakness, abnormal labs and diarrhea.  #1 acute renal failure on CKD stage IV-creatinine worsened from 2.4 to 3.99. -  likely prerenal from diarrhea. Hold lisinopril. Gentle hydration with IVF. -Renal ultrasound is unremarkable. Continue hydration with IVF per Dr. Cherylann RatelLateef. F/u BMP.  #2 chronic anemia-stable. Continue oral iron supplements.  #3 hypertension-on Cardizem orally.  #4 hypothyroidism-continue Synthroid  Metabolic acidosis. sodium bicarbonate 650 g by mouth twice a day. Diarrhea. Stool test.  Generalized weakness. PT evaluation. All the records are reviewed and case discussed with Care Management/Social Worker. Management plans discussed with the patient, family and they are in agreement.  CODE STATUS: Full code  TOTAL TIME TAKING CARE OF THIS PATIENT: 37 minutes.   More than 50% of the time was spent in counseling/coordination of care: YES  POSSIBLE D/C IN 2 DAYS, DEPENDING ON CLINICAL CONDITION.   Shaune Pollackhen, Terricka Onofrio M.D on 04/18/2016 at 1:21 PM  Between 7am to 6pm - Pager - [phone]  After 6pm go to [email or web address] - Social research officer, governmentpassword [email or web address]  [facility]  Office  [phone]  CC: Primary care physician; Mickey FarberHIES, DAVID, MD  Note: This dictation was prepared with Dragon dictation along with smaller phrase technology. Any transcriptional errors that result from this process are unintentional.

## 2016-04-18 NOTE — Progress Notes (Signed)
[facility]  ROUNDING NOTE   Subjective:  Patient well known to [place]. We follow her in the office for chronic kidney disease stage IV. Her baseline creatinine is 2.2. She presents now with having diarrhea at the nursing home. Labs were done at the redilatation center and her creatinine was up to 4.1 therefore she was sent here. With IV fluid hydration creatinine has come down slightly to 3.7. Renal ultrasound shows bilateral atrophic kidneys with increased cortical echogenicity. No hydronephrosis was noted.  Objective:  Vital signs in last 24 hours:  Temp:  [97.5 F (36.4 C)-98 F (36.7 C)] 97.5 F (36.4 C) (10/28 0510) Pulse Rate:  [65-72] 65 (10/28 1016) Resp:  [16-20] 20 (10/28 0510) BP: (136-162)/(56-71) 162/71 (10/28 1016) SpO2:  [95 %-100 %] 95 % (10/28 0510) Weight:  [44.5 kg (98 lb)-47.6 kg (104 lb 14.4 oz)] 47.6 kg (104 lb 14.4 oz) (10/27 2151)  Weight change:  Filed Weights   04/17/16 1653 04/17/16 2151  Weight: 44.5 kg (98 lb) 47.6 kg (104 lb 14.4 oz)    Intake/Output: I/O last 3 completed shifts: In: 171 [P.O.:60; I.V.:111] Out: 0    Intake/Output this shift:  Total I/O In: 384 [P.O.:120; I.V.:264] Out: 0   Physical Exam: General: No acute distress  Head: Normocephalic, atraumatic. Moist oral mucosal membranes  Eyes: Anicteric  Neck: Supple, trachea midline  Lungs:  Clear to auscultation, normal effort  Heart: S1S2 no rubs  Abdomen:  Soft, nontender, bowel sounds present  Extremities: no peripheral edema.  Neurologic: Nonfocal, moving all four extremities  Skin: No lesions       Basic Metabolic Panel:  Recent Labs Lab 04/17/16 1717 04/18/16 0458  NA 138 139  K 4.3 4.2  CL 112* 115*  CO2 17* 17*  GLUCOSE 109* 86  BUN 62* 63*  CREATININE 3.99* 3.75*  CALCIUM 8.7* 8.7*    Liver Function Tests:  Recent Labs Lab 04/17/16 1717  AST 61*  ALT 48  ALKPHOS 117  BILITOT 0.2*  PROT 7.0  ALBUMIN 3.0*    Recent Labs Lab  04/17/16 1717  LIPASE 38   No results for input(s): AMMONIA in the last 168 hours.  CBC:  Recent Labs Lab 04/17/16 1717 04/18/16 0458  WBC 5.1 5.4  HGB 10.2* 9.2*  HCT 31.0* 28.7*  MCV 90.8 91.8  PLT 175 169    Cardiac Enzymes: No results for input(s): CKTOTAL, CKMB, CKMBINDEX, TROPONINI in the last 168 hours.  BNP: Invalid input(s): POCBNP  CBG: No results for input(s): GLUCAP in the last 168 hours.  Microbiology: Results for orders placed or performed in visit on 01/15/14  Urine culture     Status: None   Collection Time: 01/15/14  4:38 PM  Result Value Ref Range Status   Micro Text Report   Final       SOURCE: CLEAN CATCH    ORGANISM 1                >100,000 CFU/ML ESCHERICHIA COLI   ANTIBIOTIC                    ORG#1     AMPICILLIN                    R         CEFAZOLIN                     S  CEFOXITIN                     S         CEFTRIAXONE                   S         CIPROFLOXACIN                 S         GENTAMICIN                    S         IMIPENEM                      S         LEVOFLOXACIN                  S         NITROFURANTOIN                S         TRIMETHOPRIM/SULFAMETHOXAZOLE S             Coagulation Studies: No results for input(s): LABPROT, INR in the last 72 hours.  Urinalysis: No results for input(s): COLORURINE, LABSPEC, PHURINE, GLUCOSEU, HGBUR, BILIRUBINUR, KETONESUR, PROTEINUR, UROBILINOGEN, NITRITE, LEUKOCYTESUR in the last 72 hours.  Invalid input(s): APPERANCEUR    Imaging: [place] Renal  Result Date: 04/18/2016 CLINICAL DATA:  Acute renal failure. EXAM: RENAL / URINARY TRACT ULTRASOUND COMPLETE COMPARISON:  None. FINDINGS: Right Kidney: Length: 9.0 cm. Marked diffuse cortical thinning with prominent renal sinus fat. The renal cortex is mildly echogenic. 1.4 cm upper pole cyst. No hydronephrosis. Left Kidney: Length: 8.3 cm. Moderate diffuse cortical thinning with prominent renal sinus fat. The cortex is mildly  echogenic. No hydronephrosis. Bladder: Mild dependent debris.  Otherwise, unremarkable. IMPRESSION: 1. Diffuse bilateral renal cortical atrophy. 2. Bilateral renal cortex increased echogenicity compatible with medical renal disease. 3. No hydronephrosis. 4. Small amount of debris in the bladder. Electronically Signed   By: Beckie SaltsSteven  Reid M.D.   On: 04/18/2016 09:47     Medications:   . sodium chloride 75 mL/hr at 04/18/16 0336   . aspirin EC  325 mg Oral Daily  . cholecalciferol  2,000 Units Oral Daily  . colchicine  0.3 mg Oral Daily  . diltiazem  120 mg Oral Daily  . ferrous sulfate  325 mg Oral Q breakfast  . heparin  5,000 Units Subcutaneous Q8H  . hydrALAZINE  25 mg Oral Q8H  . levothyroxine  62.5 mcg Oral QAC breakfast   acetaminophen **OR** acetaminophen, hydrALAZINE, ondansetron **OR** ondansetron (ZOFRAN) IV, traMADol  Assessment/ Plan:  82 y.o. female with hypertension, hyperlipidemia, hypothyroidism, osteoporosis, vitamin B12 deficiency, diet controlled diabetes mellitus type 2, hyperuricemia, peptic ulcer disease, excision of parotid gland tumor  1. Acute renal failure/chronic kidney disease stage IV baseline creatinine 2.2. We follow the patient closely as an outpatient for chronic kidney disease stage IV. She presents now with diarrhea and acute renal failure. Renal ultrasound negative for hydronephrosis. Agree with IV fluids in the form of 0.9 normal saline at 75 cc per hour. Continue to monitor renal function daily and avoid nephrotoxins as possible.  2. Anemia of chronic kidney disease. Hemoglobin currently 9.2. Continue to monitor CBC. No indication for her crit at the moment.  3. Metabolic acidosis. Serum bicarbonate currently 17. Start the  patient on sodium bicarbonate 650 g by mouth twice a day.   LOS: 1 Aldwin Micalizzi 10/28/201711:10 AM

## 2016-04-19 LAB — BASIC METABOLIC PANEL
ANION GAP: 5 (ref 5–15)
BUN: 60 mg/dL — AB (ref 6–20)
CALCIUM: 8.6 mg/dL — AB (ref 8.9–10.3)
CO2: 17 mmol/L — AB (ref 22–32)
Chloride: 118 mmol/L — ABNORMAL HIGH (ref 101–111)
Creatinine, Ser: 3.45 mg/dL — ABNORMAL HIGH (ref 0.44–1.00)
GFR calc Af Amer: 12 mL/min — ABNORMAL LOW (ref >60)
GFR calc non Af Amer: 11 mL/min — ABNORMAL LOW (ref >60)
GLUCOSE: 86 mg/dL (ref 65–99)
POTASSIUM: 4.1 mmol/L (ref 3.5–5.1)
Sodium: 140 mmol/L (ref 135–145)

## 2016-04-19 MED ORDER — METRONIDAZOLE 500 MG PO TABS
500.0000 mg | ORAL_TABLET | Freq: Three times a day (TID) | ORAL | Status: DC
  Administered 2016-04-19 – 2016-04-21 (×7): 500 mg via ORAL
  Filled 2016-04-19 (×7): qty 1

## 2016-04-19 MED ORDER — SODIUM BICARBONATE 8.4 % IV SOLN
INTRAVENOUS | Status: DC
  Administered 2016-04-19 – 2016-04-21 (×4): via INTRAVENOUS
  Filled 2016-04-19 (×7): qty 150

## 2016-04-19 NOTE — Progress Notes (Signed)
[facility]  ROUNDING NOTE   Subjective:  Renal function has improved slightly. Creatinine currently down to 3.45. She remains on IV fluid hydration. Mild acidosis persists.  Objective:  Vital signs in last 24 hours:  Temp:  [97.7 F (36.5 C)-98 F (36.7 C)] 98 F (36.7 C) (10/29 0438) Pulse Rate:  [58] 58 (10/29 0438) Resp:  [16-18] 16 (10/29 0438) BP: (109-152)/(49-65) 109/49 (10/29 0438) SpO2:  [96 %-100 %] 96 % (10/29 0438)  Weight change:  Filed Weights   04/17/16 1653 04/17/16 2151  Weight: 44.5 kg (98 lb) 47.6 kg (104 lb 14.4 oz)    Intake/Output: I/O last 3 completed shifts: In: 1756.5 [P.O.:420; I.V.:1336.5] Out: 0    Intake/Output this shift:  No intake/output data recorded.  Physical Exam: General: No acute distress  Head: Normocephalic, atraumatic. Moist oral mucosal membranes  Eyes: Anicteric  Neck: Supple, trachea midline  Lungs:  Clear to auscultation, normal effort  Heart: S1S2 no rubs  Abdomen:  Soft, nontender, bowel sounds present  Extremities: no peripheral edema.  Neurologic: Nonfocal, moving all four extremities  Skin: No lesions       Basic Metabolic Panel:  Recent Labs Lab 04/17/16 1717 04/18/16 0458 04/19/16 0627  NA 138 139 140  K 4.3 4.2 4.1  CL 112* 115* 118*  CO2 17* 17* 17*  GLUCOSE 109* 86 86  BUN 62* 63* 60*  CREATININE 3.99* 3.75* 3.45*  CALCIUM 8.7* 8.7* 8.6*    Liver Function Tests:  Recent Labs Lab 04/17/16 1717  AST 61*  ALT 48  ALKPHOS 117  BILITOT 0.2*  PROT 7.0  ALBUMIN 3.0*    Recent Labs Lab 04/17/16 1717  LIPASE 38   No results for input(s): AMMONIA in the last 168 hours.  CBC:  Recent Labs Lab 04/17/16 1717 04/18/16 0458  WBC 5.1 5.4  HGB 10.2* 9.2*  HCT 31.0* 28.7*  MCV 90.8 91.8  PLT 175 169    Cardiac Enzymes: No results for input(s): CKTOTAL, CKMB, CKMBINDEX, TROPONINI in the last 168 hours.  BNP: Invalid input(s): POCBNP  CBG: No results for input(s):  GLUCAP in the last 168 hours.  Microbiology: Results for orders placed or performed during the hospital encounter of 04/17/16  C difficile quick scan w PCR reflex     Status: Abnormal   Collection Time: 04/18/16  5:55 PM  Result Value Ref Range Status   C Diff antigen POSITIVE (A) NEGATIVE Final   C Diff toxin NEGATIVE NEGATIVE Final   C Diff interpretation Results are indeterminate. See PCR results.  Final  Clostridium Difficile by PCR     Status: Abnormal   Collection Time: 04/18/16  5:55 PM  Result Value Ref Range Status   Toxigenic C Difficile by pcr POSITIVE (A) NEGATIVE Final    Comment: Positive for toxigenic C. difficile with little to no toxin production. Only treat if clinical presentation suggests symptomatic illness.    Coagulation Studies: No results for input(s): LABPROT, INR in the last 72 hours.  Urinalysis: No results for input(s): COLORURINE, LABSPEC, PHURINE, GLUCOSEU, HGBUR, BILIRUBINUR, KETONESUR, PROTEINUR, UROBILINOGEN, NITRITE, LEUKOCYTESUR in the last 72 hours.  Invalid input(s): APPERANCEUR    Imaging: [place] Renal  Result Date: 04/18/2016 CLINICAL DATA:  Acute renal failure. EXAM: RENAL / URINARY TRACT ULTRASOUND COMPLETE COMPARISON:  None. FINDINGS: Right Kidney: Length: 9.0 cm. Marked diffuse cortical thinning with prominent renal sinus fat. The renal cortex is mildly echogenic. 1.4 cm upper pole cyst. No hydronephrosis. Left Kidney: Length: 8.3  cm. Moderate diffuse cortical thinning with prominent renal sinus fat. The cortex is mildly echogenic. No hydronephrosis. Bladder: Mild dependent debris.  Otherwise, unremarkable. IMPRESSION: 1. Diffuse bilateral renal cortical atrophy. 2. Bilateral renal cortex increased echogenicity compatible with medical renal disease. 3. No hydronephrosis. 4. Small amount of debris in the bladder. Electronically Signed   By: Beckie SaltsSteven  Reid M.D.   On: 04/18/2016 09:47     Medications:   . sodium chloride 75 mL/hr at 04/19/16 0747    . aspirin EC  325 mg Oral Daily  . cholecalciferol  2,000 Units Oral Daily  . colchicine  0.3 mg Oral Daily  . diltiazem  120 mg Oral Daily  . ferrous sulfate  325 mg Oral Q breakfast  . heparin  5,000 Units Subcutaneous Q8H  . hydrALAZINE  25 mg Oral Q8H  . levothyroxine  62.5 mcg Oral QAC breakfast  . metroNIDAZOLE  500 mg Oral Q8H  . sodium bicarbonate  650 mg Oral BID   acetaminophen **OR** acetaminophen, hydrALAZINE, ondansetron **OR** ondansetron (ZOFRAN) IV, traMADol  Assessment/ Plan:  82 y.o. female with hypertension, hyperlipidemia, hypothyroidism, osteoporosis, vitamin B12 deficiency, diet controlled diabetes mellitus type 2, hyperuricemia, peptic ulcer disease, excision of parotid gland tumor  1. Acute renal failure/chronic kidney disease stage IV baseline creatinine 2.2. We follow the patient closely as an outpatient for chronic kidney disease stage IV. She presents now with diarrhea and acute renal failure. Renal ultrasound negative for hydronephrosis.  -  Renal function improving. Creatinine down to 3.45. Continue IV fluid hydration for now.  2. Anemia of chronic kidney disease. Hemoglobin currently 9.2. No urgent indication for Procrit at the moment.  3. Metabolic acidosis. Patient was started on by mouth sodium bicarbonate. We will change 0.9 normal saline to sodium bicarbonate drip.   LOS: 2 Amylynn Fano 10/29/20171:16 PM

## 2016-04-19 NOTE — Progress Notes (Signed)
[facility] -  at [facility]   PATIENT NAME: Catherine Anderson    MR#:  [id number]  DATE OF BIRTH:  [birth year]  SUBJECTIVE:  CHIEF COMPLAINT:   Chief Complaint  Patient presents with  . Diarrhea  . Abnormal Lab   No abdominal pain, nausea, vomiting or diarrhea. But has generalized weakness REVIEW OF SYSTEMS:  Review of Systems  Constitutional: Positive for malaise/fatigue. Negative for chills and fever.  HENT: Negative for sore throat.   Eyes: Negative for blurred vision and double vision.  Respiratory: Negative for cough, shortness of breath and stridor.   Cardiovascular: Negative for chest pain and leg swelling.  Gastrointestinal: Negative for abdominal pain, blood in stool, diarrhea, melena, nausea and vomiting.  Genitourinary: Negative for dysuria, frequency and urgency.  Musculoskeletal: Negative for joint pain.  Skin: Negative for itching and rash.  Neurological: Negative for dizziness, focal weakness and loss of consciousness.  Psychiatric/Behavioral: Negative for depression. The patient is not nervous/anxious.     DRUG ALLERGIES:  No Known Allergies VITALS:  Blood pressure (!) 109/49, pulse (!) 58, temperature 98 F (36.7 C), temperature source Oral, resp. rate 16, height  (1.499 m), weight 104 lb 14.4 oz (47.6 kg), SpO2 96 %. PHYSICAL EXAMINATION:  Physical Exam  Constitutional: She is oriented to person, place, and time. No distress.  HENT:  Head: Normocephalic.  Mouth/Throat: Oropharynx is clear and moist.  Eyes: Conjunctivae and EOM are normal. No scleral icterus.  Neck: Normal range of motion. Neck supple. No JVD present. No tracheal deviation present.  Cardiovascular: Normal rate, regular rhythm and normal heart sounds.  Exam reveals no gallop.   No murmur heard. Pulmonary/Chest: Effort normal and breath sounds normal. No respiratory distress. She has no wheezes. She has no rales.  Abdominal: Soft. Bowel sounds are normal. She  exhibits no distension. There is no tenderness.  Musculoskeletal: Normal range of motion. She exhibits no edema or tenderness.  Neurological: She is alert and oriented to person, place, and time. No cranial nerve deficit.  Skin: No rash noted. No erythema.  Psychiatric: Affect normal.   LABORATORY PANEL:   CBC  Recent Labs Lab 04/18/16 0458  WBC 5.4  HGB 9.2*  HCT 28.7*  PLT 169   ------------------------------------------------------------------------------------------------------------------ Chemistries   Recent Labs Lab 04/17/16 1717  04/19/16 0627  NA 138  < > 140  K 4.3  < > 4.1  CL 112*  < > 118*  CO2 17*  < > 17*  GLUCOSE 109*  < > 86  BUN 62*  < > 60*  CREATININE 3.99*  < > 3.45*  CALCIUM 8.7*  < > 8.6*  AST 61*  --   --   ALT 48  --   --   ALKPHOS 117  --   --   BILITOT 0.2*  --   --   < > = values in this interval not displayed. RADIOLOGY:  No results found. ASSESSMENT AND PLAN:   Catherine Anderson  is a 82 y.o. female with a known history of CK D stage IV with baseline creatinine of 2.4, anemia, hypertension, hypothyroidism, osteoporosis, and diabetes mellitus presents from [facility] secondary to weakness, abnormal labs and diarrhea.  #1 acute renal failure on CKD stage IV-creatinine worsened from 2.4 to 3.99. - likely prerenal from diarrhea. Hold lisinopril. Continue normal saline and follow-up BMP.. -Renal ultrasound is unremarkable.   #2 chronic anemia-stable. Continue oral iron supplements.  #3 hypertension-on Cardizem orally.  #4  hypothyroidism-continue Synthroid  Metabolic acidosis. sodium bicarbonate 650 g by mouth twice a day.  C. difficile diarrhea. C. difficile PCR test is positive, start Flagyl every 8 hours.  Generalized weakness. PT evaluation suggested skilled nursing facility placement.  All the records are reviewed and case discussed with Care Management/Social Worker. Management plans discussed with the patient, family and  they are in agreement.  CODE STATUS: Full code  TOTAL TIME TAKING CARE OF THIS PATIENT: 36 minutes.   More than 50% of the time was spent in counseling/coordination of care: YES  POSSIBLE D/C IN 2-3 DAYS, DEPENDING ON CLINICAL CONDITION.   Shaune Pollackhen, Catherine Anderson M.D on 04/19/2016 at 11:47 AM  Between 7am to 6pm - Pager - [phone]  After 6pm go to [email or web address] - Social research officer, governmentpassword EPAS [email or web address]  [facility]  Office  [phone]  CC: Primary care physician; Mickey FarberHIES, DAVID, MD  Note: This dictation was prepared with Dragon dictation along with smaller phrase technology. Any transcriptional errors that result from this process are unintentional.

## 2016-04-19 NOTE — Clinical Social Work Note (Signed)
Clinical Social Work Assessment  Patient Details  Name: Catherine Anderson MRN: [id number] Date of Birth: [birth year]  Date of referral:  04/19/16               Reason for consult:  Facility Placement                Permission sought to share information with:  Oceanographer[facility]ility Contact Representative Permission granted to share information::  Yes, Verbal Permission Granted  Name::        Agency::     Relationship::     Contact Information:     Housing/Transportation Living arrangements for the past 2 months:  Single Family Home Source of Information:  Patient Patient Interpreter Needed:  None Criminal Activity/Legal Involvement Pertinent to Current Situation/Hospitalization:  No - Comment as needed Significant Relationships:  Other Family Members Lives with:  Self Do you feel safe going back to the place where you live?  Yes Need for family participation in patient care:  No (Coment)  Care giving concerns:  STR   Social Worker assessment / plan:  The CSW visited the patient at bedside and explained role in dc planning. The patient was alert and oriented x4 and gave verbal permission to conduct a SNF bed search, [facility].  The patient lives alone and ambulates with a cane at baseline, although she also has a walker if needed. She is independent with medications and finances, and she is independent with all ADLs.   Employment status:  Retired Health and safety inspectornsurance information:  Medicare PT Recommendations:  Skilled Nursing Facility Information / Referral to community resources:  Skilled Nursing Facility  Patient/Family's Response to care:  Patient thanked CSW for assistance.  Patient/Family's Understanding of and Emotional Response to Diagnosis, Current Treatment, and Prognosis: Patient understood dc plan and is in agreement.  Emotional Assessment Appearance:  Appears stated age Attitude/Demeanor/Rapport:   (The patient was sleepy but pleasant and  cooperative.) Affect (typically observed):  Appropriate, Pleasant Orientation:  Oriented to Self, Oriented to Place, Oriented to  Time, Oriented to Situation Alcohol / Substance use:  Never Used Psych involvement (Current and /or in the community):  No (Comment)  Discharge Needs  Concerns to be addressed:  Discharge Planning Concerns Readmission within the last 30 days:  No Current discharge risk:  None Barriers to Discharge:  Continued Medical Work up   [employer] CorporationKaren M Matsue Strom, LCSW 04/19/2016, 2:58 PM

## 2016-04-19 NOTE — [place] FL2 (Signed)
MEDICAID FL2 LEVEL OF CARE SCREENING TOOL     IDENTIFICATION  Patient Name: Catherine Anderson Birthdate: [birth year] Sex: female Admission Date (Current Location): 04/17/2016  [place]   Facility and Address:  [facility], [address], [place], [place] [address]      Provider Number: [id number]  Attending Physician Name and Address:  Shaune PollackQing Chen, MD  Relative Name and Phone Number:       Current Level of Care: Hospital Recommended Level of Care: Skilled Nursing Facility Prior Approval Number:    Date Approved/Denied:   PASRR Number: [id number]  Discharge Plan: SNF    Current Diagnoses: Patient Active Problem List   Diagnosis Date Noted  . ARF (acute renal failure) (HCC) 04/17/2016  . Atrial fibrillation with RVR (HCC) 03/31/2016  . HTN (hypertension) 03/31/2016  . HLD (hyperlipidemia) 03/31/2016  . CKD (chronic kidney disease), stage IV (HCC) 03/31/2016  . Hypothyroidism 03/31/2016    Orientation RESPIRATION BLADDER Height & Weight     Self, Time, Situation, Place  Normal Continent Weight: 104 lb 14.4 oz (47.6 kg) Height:  4\' 11"  (149.9 cm)  BEHAVIORAL SYMPTOMS/MOOD NEUROLOGICAL BOWEL NUTRITION STATUS      Continent    AMBULATORY STATUS COMMUNICATION OF NEEDS Skin   Total Care Verbally Normal                       Personal Care Assistance Level of Assistance  Bathing, Dressing Bathing Assistance: Maximum assistance Feeding assistance: Independent Dressing Assistance: Maximum assistance     Functional Limitations Info    Sight Info: Adequate Hearing Info: Impaired Speech Info: Adequate    SPECIAL CARE FACTORS FREQUENCY  PT (By licensed PT), OT (By licensed OT)     PT Frequency: up to 5X per day 5 days per week OT Frequency: up to 5X per day 5 days per week            Contractures Contractures Info: Present    Additional Factors Info    Code Status Info: Full  Code Allergies Info: NKA           Current Medications (04/19/2016):  This is the current hospital active medication list Current Facility-Administered Medications  Medication Dose Route Frequency Provider Last Rate Last Dose  . acetaminophen (TYLENOL) tablet 650 mg  650 mg Oral Q6H PRN Enid Baasadhika Kalisetti, MD       Or  . acetaminophen (TYLENOL) suppository 650 mg  650 mg Rectal Q6H PRN Enid Baasadhika Kalisetti, MD      . aspirin EC tablet 325 mg  325 mg Oral Daily Enid Baasadhika Kalisetti, MD   325 mg at 04/19/16 1037  . cholecalciferol (VITAMIN D) tablet 2,000 Units  2,000 Units Oral Daily Enid Baasadhika Kalisetti, MD   2,000 Units at 04/18/16 1024  . colchicine tablet 0.3 mg  0.3 mg Oral Daily Enid Baasadhika Kalisetti, MD   0.3 mg at 04/19/16 1034  . diltiazem (CARDIZEM CD) 24 hr capsule 120 mg  120 mg Oral Daily Enid Baasadhika Kalisetti, MD   120 mg at 04/19/16 1037  . ferrous sulfate tablet 325 mg  325 mg Oral Q breakfast Enid Baasadhika Kalisetti, MD   325 mg at 04/19/16 1037  . heparin injection 5,000 Units  5,000 Units Subcutaneous Q8H Enid Baasadhika Kalisetti, MD   5,000 Units at 04/19/16 0542  . hydrALAZINE (APRESOLINE) injection 10 mg  10 mg Intravenous Q6H PRN Shaune PollackQing Chen, MD      . hydrALAZINE (APRESOLINE)  tablet 25 mg  25 mg Oral Q8H Shaune PollackQing Chen, MD   25 mg at 04/19/16 0542  . levothyroxine (SYNTHROID, LEVOTHROID) tablet 62.5 mcg  62.5 mcg Oral QAC breakfast Enid Baasadhika Kalisetti, MD   62.5 mcg at 04/19/16 1036  . metroNIDAZOLE (FLAGYL) tablet 500 mg  500 mg Oral Q8H Shaune PollackQing Chen, MD   500 mg at 04/19/16 1033  . ondansetron (ZOFRAN) tablet 4 mg  4 mg Oral Q6H PRN Enid Baasadhika Kalisetti, MD       Or  . ondansetron (ZOFRAN) injection 4 mg  4 mg Intravenous Q6H PRN Enid Baasadhika Kalisetti, MD      . sodium bicarbonate 150 mEq in dextrose 5 % 1,000 mL infusion   Intravenous Continuous Munsoor Lateef, MD      . sodium bicarbonate tablet 650 mg  650 mg Oral BID Munsoor Lateef, MD   650 mg at 04/18/16 2134  . traMADol (ULTRAM) tablet 50 mg  50 mg Oral  Q12H PRN Enid Baasadhika Kalisetti, MD         Discharge Medications: Please see discharge summary for a list of discharge medications.  Relevant Imaging Results:  Relevant Lab Results:   Additional Information  SS #[id number]  Judi CongKaren M Maila Dukes, LCSW

## 2016-04-20 LAB — BASIC METABOLIC PANEL
Anion gap: 6 (ref 5–15)
BUN: 50 mg/dL — ABNORMAL HIGH (ref 6–20)
CO2: 23 mmol/L (ref 22–32)
CREATININE: 3.04 mg/dL — AB (ref 0.44–1.00)
Calcium: 8.5 mg/dL — ABNORMAL LOW (ref 8.9–10.3)
Chloride: 111 mmol/L (ref 101–111)
GFR, EST AFRICAN AMERICAN: 14 mL/min — AB (ref >60)
GFR, EST NON AFRICAN AMERICAN: 12 mL/min — AB (ref >60)
Glucose, Bld: 100 mg/dL — ABNORMAL HIGH (ref 65–99)
POTASSIUM: 3.6 mmol/L (ref 3.5–5.1)
SODIUM: 140 mmol/L (ref 135–145)

## 2016-04-20 NOTE — Care Management Important Message (Signed)
Important Message  Patient Details  Name: Catherine Anderson MRN: [id number] Date of Birth: Sep 10, 1922   Medicare Important Message Given:  Yes    Chapman FitchBOWEN, Ganon Demasi T, RN 04/20/2016, 11:56 AM

## 2016-04-20 NOTE — Progress Notes (Addendum)
[facility] [place]   PATIENT NAME: Catherine Anderson    MR#:  [id number]  DATE OF BIRTH:  11-23-1922  SUBJECTIVE: One episode of loose stool today but better than yesterday. Tolerating the diet.   CHIEF COMPLAINT:   Chief Complaint  Patient presents with  . Diarrhea  . Abnormal Lab   No abdominal pain, nausea, vomiting or diarrhea. But has generalized weakness REVIEW OF SYSTEMS:  Review of Systems  Constitutional: Positive for malaise/fatigue. Negative for chills and fever.  HENT: Negative for sore throat.   Eyes: Negative for blurred vision and double vision.  Respiratory: Negative for cough, shortness of breath and stridor.   Cardiovascular: Negative for chest pain and leg swelling.  Gastrointestinal: Negative for abdominal pain, blood in stool, diarrhea, melena, nausea and vomiting.  Genitourinary: Negative for dysuria, frequency and urgency.  Musculoskeletal: Negative for joint pain.  Skin: Negative for itching and rash.  Neurological: Negative for dizziness, focal weakness and loss of consciousness.  Psychiatric/Behavioral: Negative for depression. The patient is not nervous/anxious.     DRUG ALLERGIES:  No Known Allergies VITALS:  Blood pressure 139/64, pulse 83, temperature 97.5 F (36.4 C), temperature source Oral, resp. rate 18, height 4\' 11"  (1.499 m), weight 47.6 kg (104 lb 14.4 oz), SpO2 95 %. PHYSICAL EXAMINATION:  Physical Exam  Constitutional: She is oriented to person, place, and time. No distress.  HENT:  Head: Normocephalic.  Mouth/Throat: Oropharynx is clear and moist.  Eyes: Conjunctivae and EOM are normal. No scleral icterus.  Neck: Normal range of motion. Neck supple. No JVD present. No tracheal deviation present.  Cardiovascular: Normal rate, regular rhythm and normal heart sounds.  Exam reveals no gallop.   No murmur heard. Pulmonary/Chest: Effort normal and breath sounds normal. No respiratory distress. She has no  wheezes. She has no rales.  Abdominal: Soft. Bowel sounds are normal. She exhibits no distension. There is no tenderness.  Musculoskeletal: Normal range of motion. She exhibits no edema or tenderness.  Neurological: She is alert and oriented to person, place, and time. No cranial nerve deficit.  Skin: No rash noted. No erythema.  Psychiatric: Affect normal.   LABORATORY PANEL:   CBC  Recent Labs Lab 04/18/16 0458  WBC 5.4  HGB 9.2*  HCT 28.7*  PLT 169   ------------------------------------------------------------------------------------------------------------------ Chemistries   Recent Labs Lab 04/17/16 1717  04/20/16 0534  NA 138  < > 140  K 4.3  < > 3.6  CL 112*  < > 111  CO2 17*  < > 23  GLUCOSE 109*  < > 100*  BUN 62*  < > 50*  CREATININE 3.99*  < > 3.04*  CALCIUM 8.7*  < > 8.5*  AST 61*  --   --   ALT 48  --   --   ALKPHOS 117  --   --   BILITOT 0.2*  --   --   < > = values in this interval not displayed. RADIOLOGY:  No results found. ASSESSMENT AND PLAN:   Catherine Anderson  is a 82 y.o. female with a known history of CK D stage IV with baseline creatinine of 2.4, anemia, hypertension, hypothyroidism, osteoporosis, and diabetes mellitus [facility] secondary to weakness, abnormal labs and diarrhea.  #1 acute renal failure on CKD stage IV-creatinine worsened from 2.4 to 3.99. - likely prerenal from diarrhea. Hold lisinopril. Continue normal saline and follow-up BMP..creatinine down to 3.continue iv fluids,l;ikely d/c am -Renal  ultrasound is unremarkable.   #2 chronic anemia-stable. Continue oral iron supplements.  #3 hypertension-on Cardizem orally.  #4 hypothyroidism-continue Synthroid  Metabolic acidosis. sodium bicarbonate 650 g by mouth twice a day. bicarb is  improved from 17-23.started bicarbonate drip and nephrology. Appreciate their input to see if we can discontinue or decrease the rate.  C. difficile diarrhea. C. difficile PCR  test is positive, start Flagyl every 8 hours.  Generalized weakness. PT evaluation suggested skilled nursing facility placement. [facility].  All the records are reviewed and case discussed with Care Management/Social Worker. Management plans discussed with the patient, family and they are in agreement.  CODE STATUS: Full code   TOTAL TIME TAKING CARE OF THIS PATIENT: 36 minutes.   More than 50% of the time was spent in counseling/coordination of care: YES  POSSIBLE D/C IN 2-3 DAYS, DEPENDING ON CLINICAL CONDITION.   Katha HammingKONIDENA,Adrion Menz M.D on 04/20/2016 at 1:24 PM  Between 7am to 6pm - Pager - [phone]  After 6pm go to [email or web address] - Social research officer, governmentpassword [email or web address] [email or web address]  [facility]  Office  [phone]  CC: Primary care physician; Mickey FarberHIES, DAVID, MD  Note: This dictation was prepared with Dragon dictation along with smaller phrase technology. Any transcriptional errors that result from this process are unintentional.

## 2016-04-20 NOTE — Clinical Social Work Note (Signed)
[facility]. Will facilitate [facility] when ready for discharge. York SpanielMonica Luiz Trumpower MSW,LCSW [phone]

## 2016-04-21 DIAGNOSIS — E039 Hypothyroidism, unspecified: Secondary | ICD-10-CM | POA: Diagnosis not present

## 2016-04-21 DIAGNOSIS — E860 Dehydration: Secondary | ICD-10-CM | POA: Diagnosis not present

## 2016-04-21 DIAGNOSIS — I083 Combined rheumatic disorders of mitral, aortic and tricuspid valves: Secondary | ICD-10-CM | POA: Diagnosis not present

## 2016-04-21 DIAGNOSIS — A0472 Enterocolitis due to Clostridium difficile, not specified as recurrent: Secondary | ICD-10-CM | POA: Diagnosis present

## 2016-04-21 DIAGNOSIS — R627 Adult failure to thrive: Secondary | ICD-10-CM | POA: Diagnosis present

## 2016-04-21 DIAGNOSIS — R809 Proteinuria, unspecified: Secondary | ICD-10-CM | POA: Diagnosis present

## 2016-04-21 DIAGNOSIS — E785 Hyperlipidemia, unspecified: Secondary | ICD-10-CM | POA: Diagnosis present

## 2016-04-21 DIAGNOSIS — I472 Ventricular tachycardia: Secondary | ICD-10-CM | POA: Diagnosis present

## 2016-04-21 DIAGNOSIS — N186 End stage renal disease: Secondary | ICD-10-CM | POA: Diagnosis not present

## 2016-04-21 DIAGNOSIS — E875 Hyperkalemia: Secondary | ICD-10-CM | POA: Diagnosis not present

## 2016-04-21 DIAGNOSIS — I10 Essential (primary) hypertension: Secondary | ICD-10-CM | POA: Diagnosis not present

## 2016-04-21 DIAGNOSIS — I2510 Atherosclerotic heart disease of native coronary artery without angina pectoris: Secondary | ICD-10-CM | POA: Diagnosis present

## 2016-04-21 DIAGNOSIS — E876 Hypokalemia: Secondary | ICD-10-CM | POA: Diagnosis present

## 2016-04-21 DIAGNOSIS — Z515 Encounter for palliative care: Secondary | ICD-10-CM | POA: Diagnosis not present

## 2016-04-21 DIAGNOSIS — I4891 Unspecified atrial fibrillation: Secondary | ICD-10-CM | POA: Diagnosis not present

## 2016-04-21 DIAGNOSIS — F320 Major depressive disorder, single episode, mild: Secondary | ICD-10-CM | POA: Diagnosis not present

## 2016-04-21 DIAGNOSIS — N179 Acute kidney failure, unspecified: Secondary | ICD-10-CM | POA: Diagnosis not present

## 2016-04-21 DIAGNOSIS — Z8249 Family history of ischemic heart disease and other diseases of the circulatory system: Secondary | ICD-10-CM | POA: Diagnosis not present

## 2016-04-21 DIAGNOSIS — A048 Other specified bacterial intestinal infections: Secondary | ICD-10-CM | POA: Diagnosis not present

## 2016-04-21 DIAGNOSIS — Z66 Do not resuscitate: Secondary | ICD-10-CM | POA: Diagnosis not present

## 2016-04-21 DIAGNOSIS — M109 Gout, unspecified: Secondary | ICD-10-CM | POA: Diagnosis present

## 2016-04-21 DIAGNOSIS — I480 Paroxysmal atrial fibrillation: Secondary | ICD-10-CM | POA: Diagnosis present

## 2016-04-21 DIAGNOSIS — I471 Supraventricular tachycardia: Secondary | ICD-10-CM | POA: Diagnosis present

## 2016-04-21 DIAGNOSIS — R0602 Shortness of breath: Secondary | ICD-10-CM | POA: Diagnosis not present

## 2016-04-21 DIAGNOSIS — R5381 Other malaise: Secondary | ICD-10-CM | POA: Diagnosis not present

## 2016-04-21 DIAGNOSIS — E872 Acidosis: Secondary | ICD-10-CM | POA: Diagnosis present

## 2016-04-21 DIAGNOSIS — I129 Hypertensive chronic kidney disease with stage 1 through stage 4 chronic kidney disease, or unspecified chronic kidney disease: Secondary | ICD-10-CM | POA: Diagnosis present

## 2016-04-21 DIAGNOSIS — R319 Hematuria, unspecified: Secondary | ICD-10-CM | POA: Diagnosis present

## 2016-04-21 DIAGNOSIS — Z7189 Other specified counseling: Secondary | ICD-10-CM | POA: Diagnosis not present

## 2016-04-21 DIAGNOSIS — E1122 Type 2 diabetes mellitus with diabetic chronic kidney disease: Secondary | ICD-10-CM | POA: Diagnosis present

## 2016-04-21 DIAGNOSIS — D631 Anemia in chronic kidney disease: Secondary | ICD-10-CM | POA: Diagnosis present

## 2016-04-21 DIAGNOSIS — N184 Chronic kidney disease, stage 4 (severe): Secondary | ICD-10-CM | POA: Diagnosis not present

## 2016-04-21 DIAGNOSIS — R7989 Other specified abnormal findings of blood chemistry: Secondary | ICD-10-CM | POA: Diagnosis not present

## 2016-04-21 DIAGNOSIS — M6281 Muscle weakness (generalized): Secondary | ICD-10-CM | POA: Diagnosis not present

## 2016-04-21 LAB — BASIC METABOLIC PANEL
Anion gap: 6 (ref 5–15)
BUN: 40 mg/dL — AB (ref 6–20)
CO2: 33 mmol/L — ABNORMAL HIGH (ref 22–32)
CREATININE: 2.65 mg/dL — AB (ref 0.44–1.00)
Calcium: 8.1 mg/dL — ABNORMAL LOW (ref 8.9–10.3)
Chloride: 101 mmol/L (ref 101–111)
GFR, EST AFRICAN AMERICAN: 17 mL/min — AB (ref >60)
GFR, EST NON AFRICAN AMERICAN: 15 mL/min — AB (ref >60)
Glucose, Bld: 107 mg/dL — ABNORMAL HIGH (ref 65–99)
POTASSIUM: 3 mmol/L — AB (ref 3.5–5.1)
SODIUM: 140 mmol/L (ref 135–145)

## 2016-04-21 LAB — CBC
HCT: 24.5 % — ABNORMAL LOW (ref 35.0–47.0)
Hemoglobin: 8.2 g/dL — ABNORMAL LOW (ref 12.0–16.0)
MCH: 29.1 pg (ref 26.0–34.0)
MCHC: 33.4 g/dL (ref 32.0–36.0)
MCV: 87.2 fL (ref 80.0–100.0)
PLATELETS: 119 10*3/uL — AB (ref 150–440)
RBC: 2.81 MIL/uL — AB (ref 3.80–5.20)
RDW: 15.8 % — ABNORMAL HIGH (ref 11.5–14.5)
WBC: 5 10*3/uL (ref 3.6–11.0)

## 2016-04-21 LAB — GIARDIA, EIA; OVA/PARASITE: GIARDIA AG STL: NEGATIVE

## 2016-04-21 LAB — O&P RESULT

## 2016-04-21 MED ORDER — METRONIDAZOLE 500 MG PO TABS: 500.0000 mg | ORAL_TABLET | Freq: Three times a day (TID) | ORAL | 0 refills | Status: DC

## 2016-04-21 MED ORDER — HYDRALAZINE HCL 25 MG PO TABS: 25.0000 mg | ORAL_TABLET | Freq: Three times a day (TID) | ORAL | 0 refills | Status: DC

## 2016-04-21 MED ORDER — TRAMADOL HCL 50 MG PO TABS: 50.0000 mg | ORAL_TABLET | Freq: Four times a day (QID) | ORAL | 0 refills | Status: AC | PRN

## 2016-04-21 MED ORDER — POTASSIUM CHLORIDE CRYS ER 20 MEQ PO TBCR
40.0000 meq | EXTENDED_RELEASE_TABLET | ORAL | Status: DC
  Administered 2016-04-21: 40 meq via ORAL
  Filled 2016-04-21: qty 2

## 2016-04-21 MED ORDER — SODIUM BICARBONATE 650 MG PO TABS: 650.0000 mg | ORAL_TABLET | Freq: Two times a day (BID) | ORAL | 0 refills | Status: AC

## 2016-04-21 NOTE — Clinical Social Work Note (Signed)
[facility] returning [facility]. CSW contacted patient's nephew and he stated that he would transport her [facility] or get someone who could. Discharge information [facility] to [facility], nurse to call report. Stanton KidneyDebra at [facility] informed that patient is cdiff positive.  York SpanielMonica Dorethia Jeanmarie MSW,LCSW [phone]

## 2016-04-21 NOTE — Clinical Social Work Note (Signed)
CSW clarified with [facility] that patient actually came from them on this admission and was there for short term rehab. Patient wishes to return at discharge. York SpanielMonica Oria Klimas MSW,LCSW [phone]

## 2016-04-21 NOTE — Discharge Summary (Signed)
Catherine Anderson, is a 82 y.o. female  DOB [birth year]  MRN [id number].  Admission date:  04/17/2016  Admitting Physician  Enid Baasadhika Kalisetti, MD  Discharge Date:  04/21/2016   Primary MD  Mickey FarberHIES, DAVID, MD  Recommendations for primary care physician for things to follow:   follow up with Primary doctor in one week    Admission Diagnosis  Dehydration [E86.0] Acute renal failure superimposed on chronic kidney disease, unspecified CKD stage, unspecified acute renal failure type (HCC) [N17.9, N18.9]   Discharge Diagnosis  Dehydration [E86.0] Acute renal failure superimposed on chronic kidney disease, unspecified CKD stage, unspecified acute renal failure type (HCC) [N17.9, N18.9]    Active Problems:   ARF (acute renal failure) (HCC)      Past Medical History:  Diagnosis Date  . CKD (chronic kidney disease), stage IV (HCC)   . Gout   . History of GI bleed   . HLD (hyperlipidemia)   . Hypertension   . Thyroid disease     Past Surgical History:  Procedure Laterality Date  . BREAST SURGERY         History of present illness and  Hospital Course:     Kindly see H&P for history of present illness and admission details, please review complete Labs, Consult reports and Test reports for all details in brief  HPI  from the history and physical done on the day of admission 82 year old female patient with chronic kidney disease stage IV, baseline creatinine 2.4, essential hypertension, hypothyroidism, osteoporosis, diabetes mellitus type 2 ,[facility] because of weakness, diarrhea, admitted for C. difficile colitis., Acute on chronic renal failure.   Hospital Course  #1. Acute on chronic renal failure with CK D stage IV: pre renal azotemia because of diarrhea. Stopped lisinopril, continued on IV hydration.  Nephrology consult obtained. Monitor edrenal function closely. Renal ultrasound did not show any obstruction, showed medical renal disease. Renal function improved with IV hydration, started  on bicarbonate drip because of metabolic acidosis, bicarb in the serum was 17. Bicarbonate improved to 23. Renal function improved ,cr down to 2.65 .creatinine was 3. 99 when she came. #2 C. difficile colitis causing diarrhea: Toxin is positive. Started on Flagyl, diarrhea decreased. Discharging her with Flagyl 500 mg every 8 hours for 14 days. #3 deconditioning: Physical therapy recommended SNF placement. [facility].   5 essential hypertension: Controlled. Continue lisinopril at discharge, started on hydralazine for better control, continue Cardizem. #6.hyperlipidemia: Continue Synthyroid   Follow UP  Contact information for after-discharge care    Destination    [facility] .   Specialty:  Skilled Nursing Facility Contact information: [address] [place] [id number] [phone]                Discharge Instructions  and  Discharge Medications       Medication List    STOP taking these medications   lisinopril 5 MG tablet Commonly known as:  PRINIVIL,ZESTRIL     TAKE these medications   aspirin EC 325 MG tablet Take 1 tablet (325 mg total) by mouth daily.   cholecalciferol 1000 units tablet Commonly known as:  VITAMIN D Take 2,000 Units by mouth daily.   colchicine 0.6 MG tablet Take 1 tablet (0.6 mg total) by mouth 2 (two) times daily.   diltiazem 120 MG 24 hr capsule Commonly known as:  CARDIZEM CD Take 1 capsule (120 mg total) by mouth daily.  ferrous sulfate 325 (65 FE) MG tablet Take 325 mg by mouth daily with breakfast.   hydrALAZINE 25 MG tablet Commonly known as:  APRESOLINE Take 1 tablet (25 mg total) by mouth every 8 (eight) hours.   levothyroxine 125 MCG tablet Commonly known as:  SYNTHROID,  LEVOTHROID Take 0.5 tablets (62.5 mcg total) by mouth daily before breakfast.   metroNIDAZOLE 500 MG tablet Commonly known as:  FLAGYL Take 1 tablet (500 mg total) by mouth every 8 (eight) hours.   sodium bicarbonate 650 MG tablet Take 1 tablet (650 mg total) by mouth 2 (two) times daily.   traMADol 50 MG tablet Commonly known as:  ULTRAM Take 1 tablet (50 mg total) by mouth every 6 (six) hours as needed.         Diet and Activity recommendation: See Discharge Instructions above   Consults obtained -Nephrology, physical therapy   Major procedures and Radiology Reports - PLEASE review detailed and final reports for all details, in brief -      [place] Renal  Result Date: 04/18/2016 CLINICAL DATA:  Acute renal failure. EXAM: RENAL / URINARY TRACT ULTRASOUND COMPLETE COMPARISON:  None. FINDINGS: Right Kidney: Length: 9.0 cm. Marked diffuse cortical thinning with prominent renal sinus fat. The renal cortex is mildly echogenic. 1.4 cm upper pole cyst. No hydronephrosis. Left Kidney: Length: 8.3 cm. Moderate diffuse cortical thinning with prominent renal sinus fat. The cortex is mildly echogenic. No hydronephrosis. Bladder: Mild dependent debris.  Otherwise, unremarkable. IMPRESSION: 1. Diffuse bilateral renal cortical atrophy. 2. Bilateral renal cortex increased echogenicity compatible with medical renal disease. 3. No hydronephrosis. 4. Small amount of debris in the bladder. Electronically Signed   By: Beckie SaltsSteven  Reid M.D.   On: 04/18/2016 09:47   Dg Chest Portable 1 View  Result Date: 03/31/2016 CLINICAL DATA:  Status post fall, with concern for chest injury. Initial encounter. EXAM: PORTABLE CHEST 1 VIEW COMPARISON:  Chest radiograph from 03/15/2016 FINDINGS: The lungs are well-aerated. Vascular congestion is noted. Increased interstitial markings raise concern for pulmonary edema. There is no evidence of pleural effusion or pneumothorax. The cardiomediastinal silhouette is enlarged. No  acute osseous abnormalities are seen. IMPRESSION: Vascular congestion and cardiomegaly. Increased interstitial markings raise concern for pulmonary edema. No displaced rib fracture seen. Electronically Signed   By: Roanna RaiderJeffery  Chang M.D.   On: 03/31/2016 21:27    Micro Results     Recent Results (from the past 240 hour(s))  C difficile quick scan w PCR reflex     Status: Abnormal   Collection Time: 04/18/16  5:55 PM  Result Value Ref Range Status   C Diff antigen POSITIVE (A) NEGATIVE Final   C Diff toxin NEGATIVE NEGATIVE Final   C Diff interpretation Results are indeterminate. See PCR results.  Final  Giardia, EIA; Ova/Parasite     Status: None   Collection Time: 04/18/16  5:55 PM  Result Value Ref Range Status   Ova + Parasite Exam DUPLICATE REQUEST  Final  Clostridium Difficile by PCR     Status: Abnormal   Collection Time: 04/18/16  5:55 PM  Result Value Ref Range Status   Toxigenic C Difficile by pcr POSITIVE (A) NEGATIVE Final    Comment: Positive for toxigenic C. difficile with little to no toxin production. Only treat if clinical presentation suggests symptomatic illness.       Today   Subjective:   Catherine Anderson today has No more diarrhea, tolerating the diet.   Objective:   Blood pressure Marland Kitchen(!)  154/62, pulse 72, temperature 98.4 F (36.9 C), temperature source Oral, resp. rate 18, height 4\' 11"  (1.499 m), weight 47.6 kg (104 lb 14.4 oz), SpO2 93 %.   Intake/Output Summary (Last 24 hours) at 04/21/16 1247 Last data filed at 04/21/16 0600  Gross per 24 hour  Intake             1309 ml  Output                0 ml  Net             1309 ml    Exam Awake Alert, Oriented x 3, No new F.N deficits, Normal affect [place].AT,PERRAL Supple Neck,No JVD, No cervical lymphadenopathy appriciated.  Symmetrical Chest wall movement, Good air movement bilaterally, CTAB RRR,No Gallops,Rubs or new Murmurs, No Parasternal Heave +ve B.Sounds, Abd Soft, Non tender, No organomegaly  appriciated, No rebound -guarding or rigidity. No Cyanosis, Clubbing or edema, No new Rash or bruise  Data Review   CBC w Diff: Lab Results  Component Value Date   WBC 5.0 04/21/2016   HGB 8.2 (L) 04/21/2016   HGB 10.3 (L) 06/17/2014   HCT 24.5 (L) 04/21/2016   HCT 32.6 (L) 06/17/2014   PLT 119 (L) 04/21/2016   PLT 242 06/17/2014   LYMPHOPCT 8 03/31/2016   MONOPCT 6 03/31/2016   EOSPCT 3 03/31/2016   BASOPCT 1 03/31/2016    CMP: Lab Results  Component Value Date   NA 140 04/21/2016   NA 142 06/17/2014   K 3.0 (L) 04/21/2016   K 4.5 06/17/2014   CL 101 04/21/2016   CL 111 (H) 06/17/2014   CO2 33 (H) 04/21/2016   CO2 24 06/17/2014   BUN 40 (H) 04/21/2016   BUN 33 (H) 06/17/2014   CREATININE 2.65 (H) 04/21/2016   CREATININE 1.85 (H) 06/17/2014   PROT 7.0 04/17/2016   PROT 8.9 (H) 01/15/2014   ALBUMIN 3.0 (L) 04/17/2016   ALBUMIN 3.7 01/15/2014   BILITOT 0.2 (L) 04/17/2016   BILITOT 0.3 01/15/2014   ALKPHOS 117 04/17/2016   ALKPHOS 94 01/15/2014   AST 61 (H) 04/17/2016   AST 27 01/15/2014   ALT 48 04/17/2016   ALT 17 01/15/2014  .   Total Time in preparing paper work, data evaluation and todays exam - 35 minutes  Zalika Tieszen M.D on 04/21/2016 at 12:47 PM    Note: This dictation was prepared with Dragon dictation along with smaller phrase technology. Any transcriptional errors that result from this process are unintentional.

## 2016-04-21 NOTE — Progress Notes (Signed)
Report given to [facility] spoke to [facility] Clapp LPN and given patient discharge instructions. Patient discharge instructions given as ordered. Patient is alert and oriented to self, takes medications without difficulty, no acute distress noted.

## 2016-04-21 NOTE — Progress Notes (Signed)
Patient picked up by her daughter and taken [facility]. Patient is alert and oriented, no acute distress noted.

## 2016-04-23 DIAGNOSIS — I10 Essential (primary) hypertension: Secondary | ICD-10-CM | POA: Diagnosis not present

## 2016-04-23 DIAGNOSIS — A048 Other specified bacterial intestinal infections: Secondary | ICD-10-CM | POA: Diagnosis not present

## 2016-04-23 DIAGNOSIS — R5381 Other malaise: Secondary | ICD-10-CM | POA: Diagnosis not present

## 2016-04-23 DIAGNOSIS — N184 Chronic kidney disease, stage 4 (severe): Secondary | ICD-10-CM | POA: Diagnosis not present

## 2016-04-28 DIAGNOSIS — A048 Other specified bacterial intestinal infections: Secondary | ICD-10-CM | POA: Diagnosis not present

## 2016-04-28 DIAGNOSIS — F320 Major depressive disorder, single episode, mild: Secondary | ICD-10-CM | POA: Diagnosis not present

## 2016-04-28 DIAGNOSIS — N184 Chronic kidney disease, stage 4 (severe): Secondary | ICD-10-CM | POA: Diagnosis not present

## 2016-04-30 DIAGNOSIS — R7989 Other specified abnormal findings of blood chemistry: Secondary | ICD-10-CM | POA: Diagnosis not present

## 2016-05-01 ENCOUNTER — Inpatient Hospital Stay
Admission: EM | Admit: 2016-05-01 | Discharge: 2016-05-08 | DRG: 683 | Disposition: A | Discharge status: Skilled Nursing Facility | Payer: Medicare Other | Attending: Internal Medicine | Admitting: Internal Medicine

## 2016-05-01 ENCOUNTER — Encounter: Payer: Self-pay | Admitting: Emergency Medicine

## 2016-05-01 DIAGNOSIS — Z8249 Family history of ischemic heart disease and other diseases of the circulatory system: Secondary | ICD-10-CM | POA: Diagnosis not present

## 2016-05-01 DIAGNOSIS — I4729 Other ventricular tachycardia: Secondary | ICD-10-CM

## 2016-05-01 DIAGNOSIS — R319 Hematuria, unspecified: Secondary | ICD-10-CM | POA: Diagnosis present

## 2016-05-01 DIAGNOSIS — A0472 Enterocolitis due to Clostridium difficile, not specified as recurrent: Secondary | ICD-10-CM | POA: Diagnosis not present

## 2016-05-01 DIAGNOSIS — N179 Acute kidney failure, unspecified: Principal | ICD-10-CM | POA: Diagnosis present

## 2016-05-01 DIAGNOSIS — R809 Proteinuria, unspecified: Secondary | ICD-10-CM | POA: Diagnosis present

## 2016-05-01 DIAGNOSIS — E785 Hyperlipidemia, unspecified: Secondary | ICD-10-CM | POA: Diagnosis present

## 2016-05-01 DIAGNOSIS — I480 Paroxysmal atrial fibrillation: Secondary | ICD-10-CM | POA: Diagnosis present

## 2016-05-01 DIAGNOSIS — R0602 Shortness of breath: Secondary | ICD-10-CM | POA: Diagnosis not present

## 2016-05-01 DIAGNOSIS — Z515 Encounter for palliative care: Secondary | ICD-10-CM

## 2016-05-01 DIAGNOSIS — E872 Acidosis, unspecified: Secondary | ICD-10-CM

## 2016-05-01 DIAGNOSIS — E876 Hypokalemia: Secondary | ICD-10-CM | POA: Diagnosis present

## 2016-05-01 DIAGNOSIS — Z66 Do not resuscitate: Secondary | ICD-10-CM | POA: Diagnosis not present

## 2016-05-01 DIAGNOSIS — E875 Hyperkalemia: Secondary | ICD-10-CM | POA: Insufficient documentation

## 2016-05-01 DIAGNOSIS — M6281 Muscle weakness (generalized): Secondary | ICD-10-CM | POA: Diagnosis not present

## 2016-05-01 DIAGNOSIS — I471 Supraventricular tachycardia, unspecified: Secondary | ICD-10-CM

## 2016-05-01 DIAGNOSIS — E1122 Type 2 diabetes mellitus with diabetic chronic kidney disease: Secondary | ICD-10-CM | POA: Diagnosis present

## 2016-05-01 DIAGNOSIS — I129 Hypertensive chronic kidney disease with stage 1 through stage 4 chronic kidney disease, or unspecified chronic kidney disease: Secondary | ICD-10-CM | POA: Diagnosis present

## 2016-05-01 DIAGNOSIS — R627 Adult failure to thrive: Secondary | ICD-10-CM | POA: Diagnosis present

## 2016-05-01 DIAGNOSIS — E860 Dehydration: Secondary | ICD-10-CM

## 2016-05-01 DIAGNOSIS — I472 Ventricular tachycardia: Secondary | ICD-10-CM | POA: Diagnosis present

## 2016-05-01 DIAGNOSIS — Z7189 Other specified counseling: Secondary | ICD-10-CM | POA: Diagnosis not present

## 2016-05-01 DIAGNOSIS — Z7982 Long term (current) use of aspirin: Secondary | ICD-10-CM

## 2016-05-01 DIAGNOSIS — E039 Hypothyroidism, unspecified: Secondary | ICD-10-CM | POA: Diagnosis present

## 2016-05-01 DIAGNOSIS — M109 Gout, unspecified: Secondary | ICD-10-CM | POA: Diagnosis present

## 2016-05-01 DIAGNOSIS — N184 Chronic kidney disease, stage 4 (severe): Secondary | ICD-10-CM | POA: Diagnosis present

## 2016-05-01 DIAGNOSIS — R262 Difficulty in walking, not elsewhere classified: Secondary | ICD-10-CM

## 2016-05-01 DIAGNOSIS — I2510 Atherosclerotic heart disease of native coronary artery without angina pectoris: Secondary | ICD-10-CM | POA: Diagnosis present

## 2016-05-01 DIAGNOSIS — R918 Other nonspecific abnormal finding of lung field: Secondary | ICD-10-CM | POA: Diagnosis not present

## 2016-05-01 DIAGNOSIS — N390 Urinary tract infection, site not specified: Secondary | ICD-10-CM | POA: Diagnosis not present

## 2016-05-01 DIAGNOSIS — N189 Chronic kidney disease, unspecified: Secondary | ICD-10-CM | POA: Diagnosis present

## 2016-05-01 DIAGNOSIS — Z7401 Bed confinement status: Secondary | ICD-10-CM | POA: Diagnosis not present

## 2016-05-01 DIAGNOSIS — D631 Anemia in chronic kidney disease: Secondary | ICD-10-CM | POA: Diagnosis not present

## 2016-05-01 DIAGNOSIS — I4891 Unspecified atrial fibrillation: Secondary | ICD-10-CM | POA: Diagnosis not present

## 2016-05-01 DIAGNOSIS — R8281 Pyuria: Secondary | ICD-10-CM

## 2016-05-01 DIAGNOSIS — I083 Combined rheumatic disorders of mitral, aortic and tricuspid valves: Secondary | ICD-10-CM | POA: Diagnosis not present

## 2016-05-01 DIAGNOSIS — N186 End stage renal disease: Secondary | ICD-10-CM | POA: Diagnosis not present

## 2016-05-01 LAB — CBC WITH DIFFERENTIAL/PLATELET
BASOS ABS: 0.1 10*3/uL (ref 0–0.1)
BASOS PCT: 1 %
Eosinophils Absolute: 0.1 10*3/uL (ref 0–0.7)
Eosinophils Relative: 1 %
HEMATOCRIT: 28.8 % — AB (ref 35.0–47.0)
HEMOGLOBIN: 9.3 g/dL — AB (ref 12.0–16.0)
Lymphocytes Relative: 13 %
Lymphs Abs: 0.8 10*3/uL — ABNORMAL LOW (ref 1.0–3.6)
MCH: 29.4 pg (ref 26.0–34.0)
MCHC: 32.1 g/dL (ref 32.0–36.0)
MCV: 91.4 fL (ref 80.0–100.0)
MONO ABS: 0.6 10*3/uL (ref 0.2–0.9)
Monocytes Relative: 10 %
NEUTROS ABS: 4.9 10*3/uL (ref 1.4–6.5)
NEUTROS PCT: 75 %
Platelets: 150 10*3/uL (ref 150–440)
RBC: 3.15 MIL/uL — ABNORMAL LOW (ref 3.80–5.20)
RDW: 17 % — AB (ref 11.5–14.5)
WBC: 6.5 10*3/uL (ref 3.6–11.0)

## 2016-05-01 LAB — CREATININE, SERUM
Creatinine, Ser: 3.81 mg/dL — ABNORMAL HIGH (ref 0.44–1.00)
GFR, EST AFRICAN AMERICAN: 11 mL/min — AB (ref >60)
GFR, EST NON AFRICAN AMERICAN: 9 mL/min — AB (ref >60)

## 2016-05-01 LAB — CBC
HCT: 29.2 % — ABNORMAL LOW (ref 35.0–47.0)
Hemoglobin: 9.3 g/dL — ABNORMAL LOW (ref 12.0–16.0)
MCH: 29.3 pg (ref 26.0–34.0)
MCHC: 31.9 g/dL — AB (ref 32.0–36.0)
MCV: 92 fL (ref 80.0–100.0)
PLATELETS: 143 10*3/uL — AB (ref 150–440)
RBC: 3.18 MIL/uL — ABNORMAL LOW (ref 3.80–5.20)
RDW: 17.2 % — AB (ref 11.5–14.5)
WBC: 6.1 10*3/uL (ref 3.6–11.0)

## 2016-05-01 LAB — URINALYSIS COMPLETE WITH MICROSCOPIC (ARMC ONLY)
BILIRUBIN URINE: NEGATIVE
Glucose, UA: NEGATIVE mg/dL
KETONES UR: NEGATIVE mg/dL
NITRITE: POSITIVE — AB
PROTEIN: 30 mg/dL — AB
SPECIFIC GRAVITY, URINE: 1.008 (ref 1.005–1.030)
Squamous Epithelial / LPF: NONE SEEN
pH: 6 (ref 5.0–8.0)

## 2016-05-01 LAB — BASIC METABOLIC PANEL
Anion gap: 9 (ref 5–15)
BUN: 60 mg/dL — ABNORMAL HIGH (ref 6–20)
CALCIUM: 8 mg/dL — AB (ref 8.9–10.3)
CO2: 21 mmol/L — AB (ref 22–32)
CREATININE: 3.44 mg/dL — AB (ref 0.44–1.00)
Chloride: 109 mmol/L (ref 101–111)
GFR, EST AFRICAN AMERICAN: 12 mL/min — AB (ref >60)
GFR, EST NON AFRICAN AMERICAN: 11 mL/min — AB (ref >60)
Glucose, Bld: 113 mg/dL — ABNORMAL HIGH (ref 65–99)
Potassium: 2.6 mmol/L — CL (ref 3.5–5.1)
SODIUM: 139 mmol/L (ref 135–145)

## 2016-05-01 LAB — MRSA PCR SCREENING: MRSA by PCR: NEGATIVE

## 2016-05-01 MED ORDER — FERROUS SULFATE 325 (65 FE) MG PO TABS
325.0000 mg | ORAL_TABLET | Freq: Every day | ORAL | Status: DC
  Administered 2016-05-02 – 2016-05-04 (×3): 325 mg via ORAL
  Filled 2016-05-01 (×3): qty 1

## 2016-05-01 MED ORDER — ACETAMINOPHEN 325 MG PO TABS: 650.0000 mg | ORAL_TABLET | Freq: Four times a day (QID) | ORAL | Status: DC | PRN

## 2016-05-01 MED ORDER — MIRTAZAPINE 15 MG PO TBDP
15.0000 mg | ORAL_TABLET | Freq: Every day | ORAL | Status: DC
  Administered 2016-05-01 – 2016-05-04 (×3): 15 mg via ORAL
  Filled 2016-05-01 (×4): qty 1

## 2016-05-01 MED ORDER — SODIUM CHLORIDE 0.9% FLUSH
3.0000 mL | Freq: Two times a day (BID) | INTRAVENOUS | Status: DC
  Administered 2016-05-01 – 2016-05-08 (×10): 3 mL via INTRAVENOUS

## 2016-05-01 MED ORDER — COLCHICINE 0.6 MG PO TABS
0.6000 mg | ORAL_TABLET | Freq: Two times a day (BID) | ORAL | Status: DC
  Administered 2016-05-01: 0.6 mg via ORAL
  Filled 2016-05-01: qty 1

## 2016-05-01 MED ORDER — OXYCODONE HCL 5 MG PO TABS
5.0000 mg | ORAL_TABLET | ORAL | Status: DC | PRN
  Administered 2016-05-04: 5 mg via ORAL
  Filled 2016-05-01: qty 1

## 2016-05-01 MED ORDER — DILTIAZEM HCL ER COATED BEADS 120 MG PO CP24
120.0000 mg | ORAL_CAPSULE | Freq: Every day | ORAL | Status: DC
  Administered 2016-05-02 – 2016-05-04 (×3): 120 mg via ORAL
  Filled 2016-05-01 (×3): qty 1

## 2016-05-01 MED ORDER — SODIUM CHLORIDE 0.9 % IV BOLUS (SEPSIS)
1000.0000 mL | Freq: Once | INTRAVENOUS | Status: AC
  Administered 2016-05-01: 1000 mL via INTRAVENOUS

## 2016-05-01 MED ORDER — ENSURE PLUS PO LIQD
120.0000 mL | Freq: Three times a day (TID) | ORAL | Status: DC
  Administered 2016-05-02 – 2016-05-08 (×12): 120 mL via ORAL
  Filled 2016-05-01 (×22): qty 250

## 2016-05-01 MED ORDER — HEPARIN SODIUM (PORCINE) 5000 UNIT/ML IJ SOLN
5000.0000 [IU] | Freq: Three times a day (TID) | INTRAMUSCULAR | Status: DC
  Administered 2016-05-01 – 2016-05-05 (×10): 5000 [IU] via SUBCUTANEOUS
  Filled 2016-05-01 (×11): qty 1

## 2016-05-01 MED ORDER — ASPIRIN EC 325 MG PO TBEC
325.0000 mg | DELAYED_RELEASE_TABLET | Freq: Every day | ORAL | Status: DC
  Administered 2016-05-02 – 2016-05-04 (×3): 325 mg via ORAL
  Filled 2016-05-01 (×3): qty 1

## 2016-05-01 MED ORDER — LEVOTHYROXINE SODIUM 125 MCG PO TABS
62.5000 ug | ORAL_TABLET | Freq: Every day | ORAL | Status: DC
  Administered 2016-05-02 – 2016-05-04 (×3): 62.5 ug via ORAL
  Filled 2016-05-01 (×3): qty 1

## 2016-05-01 MED ORDER — HYDRALAZINE HCL 25 MG PO TABS
25.0000 mg | ORAL_TABLET | Freq: Three times a day (TID) | ORAL | Status: DC
  Administered 2016-05-01 – 2016-05-02 (×3): 25 mg via ORAL
  Filled 2016-05-01 (×3): qty 1

## 2016-05-01 MED ORDER — SODIUM CHLORIDE 0.9 % IV SOLN
INTRAVENOUS | Status: DC
  Administered 2016-05-01 – 2016-05-02 (×3): via INTRAVENOUS

## 2016-05-01 MED ORDER — VANCOMYCIN 50 MG/ML ORAL SOLUTION
125.0000 mg | Freq: Four times a day (QID) | ORAL | Status: DC
  Administered 2016-05-01 – 2016-05-05 (×14): 125 mg via ORAL
  Filled 2016-05-01 (×20): qty 2.5

## 2016-05-01 MED ORDER — ONDANSETRON HCL 4 MG/2ML IJ SOLN: 4.0000 mg | Freq: Four times a day (QID) | INTRAMUSCULAR | Status: DC | PRN

## 2016-05-01 MED ORDER — POTASSIUM CHLORIDE CRYS ER 20 MEQ PO TBCR
40.0000 meq | EXTENDED_RELEASE_TABLET | Freq: Once | ORAL | Status: AC
  Administered 2016-05-01: 40 meq via ORAL
  Filled 2016-05-01: qty 2

## 2016-05-01 MED ORDER — SODIUM BICARBONATE 650 MG PO TABS
650.0000 mg | ORAL_TABLET | Freq: Two times a day (BID) | ORAL | Status: DC
Start: 2016-05-01 — End: 2016-05-03
  Administered 2016-05-01 – 2016-05-02 (×2): 650 mg via ORAL
  Filled 2016-05-01 (×3): qty 1

## 2016-05-01 MED ORDER — ONDANSETRON HCL 4 MG PO TABS: 4.0000 mg | ORAL_TABLET | Freq: Four times a day (QID) | ORAL | Status: DC | PRN

## 2016-05-01 MED ORDER — ACETAMINOPHEN 650 MG RE SUPP: 650.0000 mg | Freq: Four times a day (QID) | RECTAL | Status: DC | PRN

## 2016-05-01 NOTE — ED Provider Notes (Signed)
[facility] Emergency Department Provider Note ____________________________________________   I have reviewed the triage vital signs and the triage nursing note.  HISTORY  Chief Complaint Abnormal Lab   Historian Patient  HPI Catherine Anderson is a 82 y[facility]o. [facility], historyof Stage 4 chronic kidney disease complicated by a recent hospital admission for acute on chronic renal failure due to dehydration from a recent diagnosis of C. difficile diarrhea. Patient has been on medication for that, and had been discharged from the hospital with improvement in her creatinine. She had repeat tests at the nursing home yesterday and was found to have worsening BUN and creatinine with a BUN of 60 and creatinine 3.46 on 04/30/16. At that point time she was ordered to receive 2 L normal saline bolus overnight and repeat blood test this morning. Repeat blood test this morning show BUN 62 and creatinine 3.71. Patient was sent in for further evaluation for acute chronic renal failure. No reported worsening fluid losses and patient states she actually had a normal bowel movement yesterday.  Last chest pain, abdominal pain, or fevers.    Past Medical History:  Diagnosis Date  . CKD (chronic kidney disease), stage IV (HCC)   . Gout   . History of GI bleed   . HLD (hyperlipidemia)   . Hypertension   . Thyroid disease     Patient Active Problem List   Diagnosis Date Noted  . ARF (acute renal failure) (HCC) 04/17/2016  . Atrial fibrillation with RVR (HCC) 03/31/2016  . HTN (hypertension) 03/31/2016  . HLD (hyperlipidemia) 03/31/2016  . CKD (chronic kidney disease), stage IV (HCC) 03/31/2016  . Hypothyroidism 03/31/2016    Past Surgical History:  Procedure Laterality Date  . BREAST SURGERY      Prior to Admission medications   Medication Sig Start Date End Date Taking? Authorizing Provider  aspirin EC 325 MG tablet Take 1 tablet (325 mg total) by mouth  daily. 04/03/16  Yes Auburn BilberryShreyang Patel, MD  Banana Flakes (BANATROL PLUS) PACK Take 1 packet by mouth 3 (three) times daily.   Yes Historical Provider, MD  colchicine 0.6 MG tablet Take 1 tablet (0.6 mg total) by mouth 2 (two) times daily. 12/29/15 01/04/17 Yes Jennye MoccasinBrian S Quigley, MD  diltiazem (CARDIZEM CD) 120 MG 24 hr capsule Take 1 capsule (120 mg total) by mouth daily. 04/03/16  Yes Auburn BilberryShreyang Patel, MD  ENSURE PLUS (ENSURE PLUS) LIQD Take 120 mLs by mouth 3 (three) times daily between meals.   Yes Historical Provider, MD  ferrous sulfate 325 (65 FE) MG tablet Take 325 mg by mouth daily with breakfast.   Yes Historical Provider, MD  hydrALAZINE (APRESOLINE) 25 MG tablet Take 1 tablet (25 mg total) by mouth every 8 (eight) hours. 04/21/16  Yes Katha HammingSnehalatha Konidena, MD  levothyroxine (SYNTHROID, LEVOTHROID) 125 MCG tablet Take 0.5 tablets (62.5 mcg total) by mouth daily before breakfast. 04/04/16  Yes Auburn BilberryShreyang Patel, MD  metroNIDAZOLE (FLAGYL) 500 MG tablet Take 1 tablet (500 mg total) by mouth every 8 (eight) hours. 04/21/16  Yes Katha HammingSnehalatha Konidena, MD  mirtazapine (REMERON SOL-TAB) 15 MG disintegrating tablet Take 15 mg by mouth at bedtime.   Yes Historical Provider, MD  sodium bicarbonate 650 MG tablet Take 1 tablet (650 mg total) by mouth 2 (two) times daily. 04/21/16  Yes Katha HammingSnehalatha Konidena, MD  traMADol (ULTRAM) 50 MG tablet Take 1 tablet (50 mg total) by mouth every 6 (six) hours as needed. 04/21/16 04/21/17 Yes Katha HammingSnehalatha Konidena,  MD    No Known Allergies  Family History  Problem Relation Age of Onset  . Aortic aneurysm Mother   . Gout Brother   . Heart disease Brother     Social History Social History  Substance Use Topics  . Smoking status: Never Smoker  . Smokeless tobacco: Never Used  . Alcohol use No    Review of Systems  Constitutional: Negative for fever. Eyes: Negative for visual changes. ENT: Negative for sore throat. Cardiovascular: Negative for chest  pain. Respiratory: Negative for shortness of breath. Gastrointestinal: Negative for abdominal pain, vomiting and diarrhea. Genitourinary: Negative for dysuria. Musculoskeletal: Negative for back pain. Skin: Negative for rash. Neurological: Negative for headache. 10 point Review of Systems otherwise negative ____________________________________________   PHYSICAL EXAM:  VITAL SIGNS: ED Triage Vitals  Enc Vitals Group     BP 05/01/16 1441 128/61     Pulse Rate 05/01/16 1441 64     Resp 05/01/16 1441 16     Temp 05/01/16 1441 97.9 F (36.6 C)     Temp Source 05/01/16 1441 Oral     SpO2 05/01/16 1441 98 %     Weight 05/01/16 1441 115 lb 8.3 oz (52.4 kg)     Height 05/01/16 1441 4\' 11"  (1.499 m)     Head Circumference --      Peak Flow --      Pain Score 05/01/16 1442 0     Pain Loc --      Pain Edu? --      Excl. in GC? --      Constitutional: Alert and Cooperative, somewhat poor historian. Well appearing and in no distress. HEENT   Head: Normocephalic and atraumatic.      Eyes: Conjunctivae are normal. PERRL. Normal extraocular movements.      Ears:         Nose: No congestion/rhinnorhea.   Mouth/Throat: Mucous membranes are moist.   Neck: No stridor. Cardiovascular/Chest: Normal rate, regular rhythm.  No murmurs, rubs, or gallops. Respiratory: Normal respiratory effort without tachypnea nor retractions. Breath sounds are clear and equal bilaterally. No wheezes/rales/rhonchi. Gastrointestinal: Soft. No distention, no guarding, no rebound. Nontender.    Genitourinary/rectal:Deferred Musculoskeletal: Nontender with normal range of motion in all extremities. No joint effusions.  No lower extremity tenderness.  No edema. Neurologic:  Normal speech and language. No gross or focal neurologic deficits are appreciated. Skin:  Skin is warm, dry and intact. No rash noted. Psychiatric: Mood and affect are normal. Speech and behavior are normal. Patient exhibits appropriate  insight and judgment.   ____________________________________________  LABS (pertinent positives/negatives)  Labs Reviewed  CBC WITH DIFFERENTIAL/PLATELET  URINALYSIS COMPLETEWITH MICROSCOPIC ([facility] ONLY)   I reviewed her lavatory studies which were sent with her on paper, today's blood work shows BUN 62, creatinine 3.7, potassium 3.3, sodium 135, chloride 99, carbon dioxide 18  Additional labs including CBC, and UA are pending. ____________________________________________    EKG I, Governor Rooks, MD, the attending physician have personally viewed and interpreted all ECGs.  None ____________________________________________  RADIOLOGY All Xrays were viewed by me. Imaging interpreted by Radiologist.  None __________________________________________  PROCEDURES  Procedure(s) performed: None  Critical Care performed: None  ____________________________________________   ED COURSE / ASSESSMENT AND PLAN  Pertinent labs & imaging results that were available during my care of the patient were reviewed by me and considered in my medical decision making (see chart for details).   Catherine Anderson is here for acute chronic renal failure with  a historical baseline creatinine around 2.2, today 3.7 which is up since 3.46 yesterday after 2 L normal saline bolus.  I spoke with on-call nephrologist Dr. Cherylann RatelLateef, who does recommend hospitalization and initial continued IV hydration. I spoke with the hospitalist for admission. I have added on CBC and urinalysis on the patient is not having specific symptoms of infection or pain or volume/fluid loss.    CONSULTATIONS:   Dr. Cherylann RatelLateef by phone.  Hospitalist for admission.   Patient / Family / Caregiver informed of clinical course, medical decision-making process, and agree with plan.  ___________________________________________   FINAL CLINICAL IMPRESSION(S) / ED DIAGNOSES   Final diagnoses:  Acute renal failure, unspecified acute renal  failure type [facility](HCC)              Note: This dictation was prepared with Dragon dictation. Any transcriptional errors that result from this process are unintentional    Governor Rooksebecca Annisa Mazzarella, MD 05/01/16 1558

## 2016-05-01 NOTE — Progress Notes (Signed)
   [facility] [place] [facility]   Advance care planning  Hospital Day: 0 days [name] is a 82 y.o. female presenting with Abnormal Lab .   Advance care planning discussed with patient  without additional Family at bedside. All questions in regards to overall condition and expected prognosis answered. The decision was made to continue current code status  CODE STATUS: full Time spent: 18 minutes   Patient is confused and unable to accurately understand situation - spoke with patients family member - Ernestene KielJoyce Mahan who feels DNR is likely the most appropriate but wants to talk with other family members first

## 2016-05-01 NOTE — Progress Notes (Signed)
Notified Dr. Clint GuyHower that patient has a critical potassium of 2.6. MD to place orders.

## 2016-05-01 NOTE — Progress Notes (Signed)
Labs 05/01/16  Sodium 135 K 3.3 Cl 99 HCO3 18 BUN 62 Cr 3.71 AG 21

## 2016-05-01 NOTE — ED Triage Notes (Signed)
[facility] c/o elevated creatinine and BUN, based on lab work drawn today.  According to EMS resident wants patient set up with dialysis.  Patient denies pain, or other symptoms.  Patient is on C. Diff precautions.  EMS vitals 124/86, 74 HR, 97% RA, 98.2 temp.

## 2016-05-01 NOTE — H&P (Signed)
[facility] [place] at [facility]   PATIENT NAME: Catherine Anderson    MR#:  [id number]  DATE OF BIRTH:  [birth year]   DATE OF ADMISSION:  05/01/2016  PRIMARY CARE PHYSICIAN: Mickey FarberHIES, Balraj Brayfield, MD   REQUESTING/REFERRING PHYSICIAN: Lord  CHIEF COMPLAINT:   Chief Complaint  Patient presents with  . Abnormal Lab    HISTORY OF PRESENT ILLNESS:  Catherine HeadsLouise Salce  is a 82 y.o. female with a known history of Chronic kidney disease stage IV recent C. difficile infection currently on Flagyl who is [facility] with diarrhea found to have abnormal blood work revealing worsening kidney function. The patient complains only of weakness no further complaints when asked about diarrhea she states she is not having any despite having 2 episodes in the emergency department.    PAST MEDICAL HISTORY:   Past Medical History:  Diagnosis Date  . CKD (chronic kidney disease), stage IV (HCC)   . Gout   . History of GI bleed   . HLD (hyperlipidemia)   . Hypertension   . Thyroid disease     PAST SURGICAL HISTORY:   Past Surgical History:  Procedure Laterality Date  . BREAST SURGERY      SOCIAL HISTORY:   Social History  Substance Use Topics  . Smoking status: Never Smoker  . Smokeless tobacco: Never Used  . Alcohol use No    FAMILY HISTORY:   Family History  Problem Relation Age of Onset  . Aortic aneurysm Mother   . Gout Brother   . Heart disease Brother     DRUG ALLERGIES:  No Known Allergies  REVIEW OF SYSTEMS:  Unable to accurately obtain given patient's mental status   MEDICATIONS AT HOME:   Prior to Admission medications   Medication Sig Start Date End Date Taking? Authorizing Provider  aspirin EC 325 MG tablet Take 1 tablet (325 mg total) by mouth daily. 04/03/16  Yes Auburn BilberryShreyang Patel, MD  Banana Flakes (BANATROL PLUS) PACK Take 1 packet by mouth 3 (three) times daily.   Yes Historical Provider, MD  colchicine 0.6 MG tablet Take 1  tablet (0.6 mg total) by mouth 2 (two) times daily. 12/29/15 01/04/17 Yes Jennye MoccasinBrian S Quigley, MD  diltiazem (CARDIZEM CD) 120 MG 24 hr capsule Take 1 capsule (120 mg total) by mouth daily. 04/03/16  Yes Auburn BilberryShreyang Patel, MD  ENSURE PLUS (ENSURE PLUS) LIQD Take 120 mLs by mouth 3 (three) times daily between meals.   Yes Historical Provider, MD  ferrous sulfate 325 (65 FE) MG tablet Take 325 mg by mouth daily with breakfast.   Yes Historical Provider, MD  hydrALAZINE (APRESOLINE) 25 MG tablet Take 1 tablet (25 mg total) by mouth every 8 (eight) hours. 04/21/16  Yes Katha HammingSnehalatha Konidena, MD  levothyroxine (SYNTHROID, LEVOTHROID) 125 MCG tablet Take 0.5 tablets (62.5 mcg total) by mouth daily before breakfast. 04/04/16  Yes Auburn BilberryShreyang Patel, MD  metroNIDAZOLE (FLAGYL) 500 MG tablet Take 1 tablet (500 mg total) by mouth every 8 (eight) hours. 04/21/16  Yes Katha HammingSnehalatha Konidena, MD  mirtazapine (REMERON SOL-TAB) 15 MG disintegrating tablet Take 15 mg by mouth at bedtime.   Yes Historical Provider, MD  sodium bicarbonate 650 MG tablet Take 1 tablet (650 mg total) by mouth 2 (two) times daily. 04/21/16  Yes Katha HammingSnehalatha Konidena, MD  traMADol (ULTRAM) 50 MG tablet Take 1 tablet (50 mg total) by mouth every 6 (six) hours as needed. 04/21/16 04/21/17 Yes Katha HammingSnehalatha Konidena, MD  VITAL SIGNS:  Blood pressure 128/61, pulse 64, temperature 97.9 F (36.6 C), temperature source Oral, resp. rate 16, height 4\' 11"  (1.499 m), weight 52.4 kg (115 lb 8.3 oz), SpO2 98 %.  PHYSICAL EXAMINATION:  VITAL SIGNS: Vitals:   05/01/16 1441  BP: 128/61  Pulse: 64  Resp: 16  Temp: 97.9 F (36.6 C)   GENERAL:82 y.o.female currently in no acute distress.  HEAD: Normocephalic, atraumatic.  EYES: Pupils equal, round, reactive to light. Extraocular muscles intact. No scleral icterus.  MOUTH: Dry mucosal membrane. Dentition intact. No abscess noted.  EAR, NOSE, THROAT: Clear without exudates. No external lesions.  NECK: Supple. No  thyromegaly. No nodules. No JVD.  PULMONARY: Clear to ascultation, without wheeze rails or rhonci. No use of accessory muscles, Good respiratory effort. good air entry bilaterally CHEST: Nontender to palpation.  CARDIOVASCULAR: S1 and S2. Regular rate and rhythm. No murmurs, rubs, or gallops. No edema. Pedal pulses 2+ bilaterally.  GASTROINTESTINAL: Soft, nontender, nondistended. No masses. Positive bowel sounds. No hepatosplenomegaly.  MUSCULOSKELETAL: No swelling, clubbing, or edema. Range of motion full in all extremities.  NEUROLOGIC: Cranial nerves II through XII are intact. No gross focal neurological deficits. Sensation intact. Reflexes intact.  SKIN: No ulceration, lesions, rashes, or cyanosis. Skin warm and dry. Turgor intact.  PSYCHIATRIC: Mood, affect within normal limits. The patient is awake, alert and oriented x 3. Insight, judgment intact.    LABORATORY PANEL:   CBC No results for input(s): WBC, HGB, HCT, PLT in the last 168 hours. ------------------------------------------------------------------------------------------------------------------  Chemistries  No results for input(s): NA, K, CL, CO2, GLUCOSE, BUN, CREATININE, CALCIUM, MG, AST, ALT, ALKPHOS, BILITOT in the last 168 hours.  Invalid input(s): GFRCGP    Labs 05/01/16  Sodium 135 K 3.3 Cl 99 HCO3 18 BUN 62 Cr 3.71 AG 21 ------------------------------------------------------------------------------------------------------------------  Cardiac Enzymes No results for input(s): TROPONINI in the last 168 hours. ------------------------------------------------------------------------------------------------------------------  RADIOLOGY:  No results found.  EKG:   Orders placed or performed during the hospital encounter of 03/31/16  . ED EKG  . ED EKG  . EKG 12-Lead  . EKG 12-Lead  . EKG    IMPRESSION AND PLAN:   82 year old Caucasian female history of chronic kidney disease stage IV who is on  active treatment for C. difficile [facility] worsening [facility] failure  1. Acute on chronic kidney failure: Consult nephrology, IV fluid hydration this is likely combination of prerenal given continuous diarrhea and poor oral intake, check urinalysis  2. C. Difficile: Place on enteric precautions currently on day #10/14 of Flagyl, given continued symptoms will switch to oral vancomycin-given worsening renal function this would classify as severe C. difficile infection  3. Hypothyroidism unspecified: Synthroid 4. Paroxysmal atrial fibrillation: Cardizem    All the records are reviewed and case discussed with ED provider. Management plans discussed with the patient, family and they are in agreement.  CODE STATUS: Full  TOTAL TIME TAKING CARE OF THIS PATIENT: 33 minutes.    Keilen Kahl,  Mardi MainlandDavid K M.D on 05/01/2016 at 4:36 PM  Between 7am to 6pm - Pager - [phone]  After 6pm: House Pager: - [phone]  [facility]  Office  [phone]  CC: Primary care physician; Mickey FarberHIES, Virginia Francisco, MD

## 2016-05-02 ENCOUNTER — Inpatient Hospital Stay: Payer: Medicare Other

## 2016-05-02 LAB — BASIC METABOLIC PANEL
Anion gap: 5 (ref 5–15)
BUN: 51 mg/dL — AB (ref 6–20)
CALCIUM: 7.8 mg/dL — AB (ref 8.9–10.3)
CO2: 18 mmol/L — ABNORMAL LOW (ref 22–32)
CREATININE: 3.32 mg/dL — AB (ref 0.44–1.00)
Chloride: 119 mmol/L — ABNORMAL HIGH (ref 101–111)
GFR calc Af Amer: 13 mL/min — ABNORMAL LOW (ref >60)
GFR, EST NON AFRICAN AMERICAN: 11 mL/min — AB (ref >60)
GLUCOSE: 90 mg/dL (ref 65–99)
Potassium: 3 mmol/L — ABNORMAL LOW (ref 3.5–5.1)
SODIUM: 142 mmol/L (ref 135–145)

## 2016-05-02 LAB — CBC
HCT: 25.3 % — ABNORMAL LOW (ref 35.0–47.0)
Hemoglobin: 8.3 g/dL — ABNORMAL LOW (ref 12.0–16.0)
MCH: 29.7 pg (ref 26.0–34.0)
MCHC: 32.8 g/dL (ref 32.0–36.0)
MCV: 90.4 fL (ref 80.0–100.0)
PLATELETS: 134 10*3/uL — AB (ref 150–440)
RBC: 2.8 MIL/uL — ABNORMAL LOW (ref 3.80–5.20)
RDW: 17.4 % — AB (ref 11.5–14.5)
WBC: 5.5 10*3/uL (ref 3.6–11.0)

## 2016-05-02 LAB — MAGNESIUM: MAGNESIUM: 1.7 mg/dL (ref 1.7–2.4)

## 2016-05-02 LAB — POTASSIUM: Potassium: 3 mmol/L — ABNORMAL LOW (ref 3.5–5.1)

## 2016-05-02 MED ORDER — COLCHICINE 0.6 MG PO TABS
0.3000 mg | ORAL_TABLET | Freq: Every day | ORAL | Status: DC
  Administered 2016-05-02: 0.3 mg via ORAL
  Filled 2016-05-02: qty 1

## 2016-05-02 MED ORDER — POTASSIUM CHLORIDE IN NACL 20-0.9 MEQ/L-% IV SOLN
INTRAVENOUS | Status: DC
  Administered 2016-05-02 – 2016-05-03 (×2): via INTRAVENOUS
  Filled 2016-05-02 (×4): qty 1000

## 2016-05-02 MED ORDER — POTASSIUM CHLORIDE 20 MEQ PO PACK
40.0000 meq | PACK | Freq: Once | ORAL | Status: AC
  Administered 2016-05-02: 40 meq via ORAL
  Filled 2016-05-02: qty 2

## 2016-05-02 NOTE — Progress Notes (Signed)
[facility]  ROUNDING NOTE   Subjective:  Patient well known to [place] as we follow her in the office for chronic kidney disease stage IV. She presented from [facility] with diarrhea and also found to have acute renal failure. Her baseline creatinine from 03/12/2016 was 2.2 with an EGFR of 19. Renal function appears to be worse now with a presenting creatinine of 3.8 which is down to 3.3 now. Renal ultrasound was performed which showed severe renal cortical atrophy but no hydronephrosis. She is receiving IV fluid hydration with 0.9 normal saline with potassium supplementation.  Objective:  Vital signs in last 24 hours:  Temp:  [97.9 F (36.6 C)-98.6 F (37 C)] 98.5 F (36.9 C) (11/11 1221) Pulse Rate:  [63-130] 70 (11/11 1401) Resp:  [16-18] 18 (11/11 1221) BP: (120-142)/(48-77) 131/65 (11/11 1401) SpO2:  [93 %-100 %] 94 % (11/11 1221)  Weight change:  Filed Weights   05/01/16 1441  Weight: 52.4 kg (115 lb 8.3 oz)    Intake/Output: I/O last 3 completed shifts: In: 1208.7 [P.O.:100; I.V.:1108.7] Out: 0    Intake/Output this shift:  Total I/O In: 852.8 [I.V.:852.8] Out: -   Physical Exam: General: No acute distress  Head: Normocephalic, atraumatic. Moist oral mucosal membranes  Eyes: Anicteric  Neck: Supple, trachea midline  Lungs:  Clear to auscultation, normal effort  Heart: S1S2 no rubs  Abdomen:  Soft, nontender, bowel sounds present  Extremities: trace peripheral edema.  Neurologic: Awake, follows commands, hard of hearing  Skin: No lesions       Basic Metabolic Panel:  Recent Labs Lab 05/01/16 1446 05/01/16 1807 05/02/16 0559 05/02/16 1454  NA  --  139 142  --   K  --  2.6* 3.0* 3.0*  CL  --  109 119*  --   CO2  --  21* 18*  --   GLUCOSE  --  113* 90  --   BUN  --  60* 51*  --   CREATININE 3.81* 3.44* 3.32*  --   CALCIUM  --  8.0* 7.8*  --   MG  --   --  1.7  --     Liver Function Tests: No results for input(s): AST, ALT,  ALKPHOS, BILITOT, PROT, ALBUMIN in the last 168 hours. No results for input(s): LIPASE, AMYLASE in the last 168 hours. No results for input(s): AMMONIA in the last 168 hours.  CBC:  Recent Labs Lab 05/01/16 1446 05/01/16 1807 05/02/16 0559  WBC 6.5 6.1 5.5  NEUTROABS 4.9  --   --   HGB 9.3* 9.3* 8.3*  HCT 28.8* 29.2* 25.3*  MCV 91.4 92.0 90.4  PLT 150 143* 134*    Cardiac Enzymes: No results for input(s): CKTOTAL, CKMB, CKMBINDEX, TROPONINI in the last 168 hours.  BNP: Invalid input(s): POCBNP  CBG: No results for input(s): GLUCAP in the last 168 hours.  Microbiology: Results for orders placed or performed during the hospital encounter of 05/01/16  MRSA PCR Screening     Status: None   Collection Time: 05/01/16  6:48 PM  Result Value Ref Range Status   MRSA by PCR NEGATIVE NEGATIVE Final    Comment:        The GeneXpert MRSA Assay (FDA approved for NASAL specimens only), is one component of a comprehensive MRSA colonization surveillance program. It is not intended to diagnose MRSA infection nor to guide or monitor treatment for MRSA infections.     Coagulation Studies: No results for input(s): LABPROT,  INR in the last 72 hours.  Urinalysis:  Recent Labs  05/01/16 1446  COLORURINE YELLOW*  LABSPEC 1.008  PHURINE 6.0  GLUCOSEU NEGATIVE  HGBUR 1+*  BILIRUBINUR NEGATIVE  KETONESUR NEGATIVE  PROTEINUR 30*  NITRITE POSITIVE*  LEUKOCYTESUR 3+*      Imaging: US Renal  Result Date: 05/02/2016 CLINICAL DATA:  Acute on chronic renal failure EXAM: RENAL / URINARY TRACT ULTRASOUND COMPLETE COMPARISON:  04/18/2016 60 FINDINGS: Right Kidney: Length: 8.9. Similar marked atrophy and cortical thinning with increased echogenicity. No obstruction or hydronephrosis. Small upper pole cyst, difficult to visualize on today's exam measuring about 1 cm. Left Kidney: Length: 7.6 cm. Similar marked atrophy and cortical thinning with increased echogenicity. No renal  obstruction or hydronephrosis. Bladder: Echogenic dependent debris within the bladder along the posterior wall appearing mobile. IMPRESSION: Severe renal cortical atrophy and thinning with increased echogenicity compatible with advanced medical renal disease. No hydronephrosis Dependent bladder debris. Electronically Signed   By: Jerilynn Mages.  Shick M.D.   On: 05/02/2016 09:47     Medications:   . 0.9 % NaCl with KCl 20 mEq / L 100 mL/hr at 05/02/16 1522   . aspirin EC  325 mg Oral Daily  . colchicine  0.3 mg Oral Daily  . diltiazem  120 mg Oral Daily  . ENSURE PLUS  120 mL Oral TID BM  . ferrous sulfate  325 mg Oral Q breakfast  . heparin  5,000 Units Subcutaneous Q8H  . levothyroxine  62.5 mcg Oral QAC breakfast  . mirtazapine  15 mg Oral QHS  . sodium bicarbonate  650 mg Oral BID  . sodium chloride flush  3 mL Intravenous Q12H  . vancomycin  125 mg Oral Q6H   acetaminophen **OR** acetaminophen, ondansetron **OR** ondansetron (ZOFRAN) IV, oxyCODONE  Assessment/ Plan:  82 y.o. female with hypertension, hyperlipidemia, hypothyroidism, osteoporosis, vitamin B12 deficiency, diet controlled diabetes mellitus type 2, hyperuricemia, peptic ulcer disease, excision of parotid gland tumor  1. Acute renal failure/chronic kidney disease stage IV baseline creatinine 2.2 with EGFR of 19. The patient has advanced renal dysfunction upon baseline. She was having diarrhea at the nursing home. Agree with IV fluid hydration with 0.9 normal saline at 100 cc per hour. Renal ultrasound was performed and was negative for hydronephrosis but this did confirm atrophic kidneys bilateral.  2. Anemia of chronic kidney disease. Hemoglobin has dropped to 8.3. We may need to consider Procrit as an outpatient. No indication for blood transfusion however continue to monitor CBC and transfuse for hemoglobin of 7 or less.  3. Hypokalemia. Serum potassium low at 3.0. She is receiving potassium repletion and IV fluids. Continue to  monitor serum potassium and replete as necessary.  4. Metabolic acidosis. Most recent serum bicarbonate was 18. Continue sodium bicarbonate 650 mg by mouth twice a day.  5. Gout:  Given severity of acute renal failure we recommend discontinuation of colchicine at this time.  LOS: 1 Catherine Anderson 11/11/20173:27 PM

## 2016-05-02 NOTE — Clinical Social Work Note (Signed)
CSW aware [facility]. Patient can return once stable. CSW will follow.  Argentina PonderKaren Martha Mckenzee Beem, MSW, LCSW-A [phone]

## 2016-05-02 NOTE — Progress Notes (Signed)
[facility] [place] at [facility]   PATIENT NAME: Catherine HeadsLouise Anderson    MR#:  [id number]  DATE OF BIRTH:  October 13, 1922  SUBJECTIVE:  CHIEF COMPLAINT:   Chief Complaint  Patient presents with  . Abnormal Lab  The patient is a 82 year old Caucasian female with past medical history significant for history of CAD. Stage IV, recent C. difficile infection, who presents for a white matter with diarrhea, worsening renal function. She feels somewhat better today, denies any abdominal pain, nausea.  Developed V. tach episode earlier today.   Review of Systems  Unable to perform ROS: Dementia  Constitutional: Negative for chills, fever and weight loss.  HENT: Negative for congestion.   Eyes: Negative for blurred vision and double vision.  Respiratory: Negative for cough, sputum production, shortness of breath and wheezing.   Cardiovascular: Negative for chest pain, palpitations, orthopnea, leg swelling and PND.  Gastrointestinal: Positive for diarrhea. Negative for abdominal pain, blood in stool, constipation, nausea and vomiting.  Genitourinary: Negative for dysuria, frequency, hematuria and urgency.  Musculoskeletal: Negative for falls.  Neurological: Negative for dizziness, tremors, focal weakness and headaches.  Endo/Heme/Allergies: Does not bruise/bleed easily.  Psychiatric/Behavioral: Negative for depression. The patient does not have insomnia.     VITAL SIGNS: Blood pressure 131/65, pulse 70, temperature 98.5 F (36.9 C), temperature source Oral, resp. rate 18, height 4\' 11"  (1.499 m), weight 52.4 kg (115 lb 8.3 oz), SpO2 94 %.  PHYSICAL EXAMINATION:   GENERAL:  82 y.o.-year-old patient lying in the bed with no acute distress. Dry oral mucosa EYES: Pupils equal, round, reactive to light and accommodation. No scleral icterus. Extraocular muscles intact.  HEENT: Head atraumatic, normocephalic. Oropharynx and nasopharynx clear.  NECK:  Supple, no jugular venous  distention. No thyroid enlargement, no tenderness.  LUNGS: Normal breath sounds bilaterally, no wheezing, rales,rhonchi or crepitation. No use of accessory muscles of respiration.  CARDIOVASCULAR: S1, S2 normal. No murmurs, rubs, or gallops.  ABDOMEN: Soft, nontender, nondistended. Bowel sounds present. No organomegaly or mass.  EXTREMITIES: No pedal edema, cyanosis, or clubbing.  NEUROLOGIC: Cranial nerves II through XII are intact. Muscle strength 5/5 in all extremities. Sensation intact. Gait not checked.  PSYCHIATRIC: The patient is alert , disoriented  SKIN: No obvious rash, lesion, or ulcer.   ORDERS/RESULTS REVIEWED:   CBC  Recent Labs Lab 05/01/16 1446 05/01/16 1807 05/02/16 0559  WBC 6.5 6.1 5.5  HGB 9.3* 9.3* 8.3*  HCT 28.8* 29.2* 25.3*  PLT 150 143* 134*  MCV 91.4 92.0 90.4  MCH 29.4 29.3 29.7  MCHC 32.1 31.9* 32.8  RDW 17.0* 17.2* 17.4*  LYMPHSABS 0.8*  --   --   MONOABS 0.6  --   --   EOSABS 0.1  --   --   BASOSABS 0.1  --   --    ------------------------------------------------------------------------------------------------------------------  Chemistries   Recent Labs Lab 05/01/16 1446 05/01/16 1807 05/02/16 0559  NA  --  139 142  K  --  2.6* 3.0*  CL  --  109 119*  CO2  --  21* 18*  GLUCOSE  --  113* 90  BUN  --  60* 51*  CREATININE 3.81* 3.44* 3.32*  CALCIUM  --  8.0* 7.8*  MG  --   --  1.7   ------------------------------------------------------------------------------------------------------------------ estimated creatinine clearance is 8.2 mL/min (by C-G formula based on SCr of 3.32 mg/dL (H)). ------------------------------------------------------------------------------------------------------------------ No results for input(s): TSH, T4TOTAL, T3FREE, THYROIDAB in the last 72 hours.  Invalid input(s):  FREET3  Cardiac Enzymes No results for input(s): CKMB, TROPONINI, MYOGLOBIN in the last 168 hours.  Invalid input(s):  CK ------------------------------------------------------------------------------------------------------------------ Invalid input(s): POCBNP ---------------------------------------------------------------------------------------------------------------  RADIOLOGY: [place] Renal  Result Date: 05/02/2016 CLINICAL DATA:  Acute on chronic renal failure EXAM: RENAL / URINARY TRACT ULTRASOUND COMPLETE COMPARISON:  04/18/2016 60 FINDINGS: Right Kidney: Length: 8.9. Similar marked atrophy and cortical thinning with increased echogenicity. No obstruction or hydronephrosis. Small upper pole cyst, difficult to visualize on today's exam measuring about 1 cm. Left Kidney: Length: 7.6 cm. Similar marked atrophy and cortical thinning with increased echogenicity. No renal obstruction or hydronephrosis. Bladder: Echogenic dependent debris within the bladder along the posterior wall appearing mobile. IMPRESSION: Severe renal cortical atrophy and thinning with increased echogenicity compatible with advanced medical renal disease. No hydronephrosis Dependent bladder debris. Electronically Signed   By: Judie PetitM.  Shick M.D.   On: 05/02/2016 09:47    EKG:  Orders placed or performed during the hospital encounter of 03/31/16  . ED EKG  . ED EKG  . EKG 12-Lead  . EKG 12-Lead  . EKG    ASSESSMENT AND PLAN:  Active Problems:   Acute on chronic kidney failure (HCC) #1. Acute on chronic renal failure, place Foley catheter to record urinary output, nephrology consultation is pending, continue IV fluids, creatinine has improved, renal ultrasound revealed renal atrophy, no hydronephrosis, bladder debris #2, pyuria, questionable urinary tract infection, get urinary cultures, hold off antibiotic therapy due to C. difficile infection #3 C. difficile diarrhea, initiate vancomycin orally, follow clinically, continue IV fluids, clear liquid diet #4. Hypokalemia, supplementing intravenously, magnesium level was marginal, supplement and  intravenously #5. V. tach episode, continue metoprolol, replenish magnesium and potassium levels, follow potassium and magnesium in the morning    Management plans discussed with the patient, family and they are in agreement.   DRUG ALLERGIES: No Known Allergies  CODE STATUS:     Code Status Orders        Start     Ordered   05/01/16 1618  Full code  Continuous     05/01/16 1617    Code Status History    Date Active Date Inactive Code Status Order ID Comments User Context   04/17/2016  9:45 PM 04/21/2016  7:28 PM Full Code [id number]  Enid Baasadhika Kalisetti, MD Inpatient   03/31/2016 11:07 PM 04/03/2016  7:00 PM Full Code [id number]  Oralia Manisavid Willis, MD ED      TOTAL TIME TAKING CARE OF THIS PATIENT: 40 minutes.    Katharina CaperVAICKUTE,Caroline Matters M.D on 05/02/2016 at 2:40 PM  Between 7am to 6pm - Pager - [phone]  After 6pm go to [email or web address] - password [email or web address] [facility]  [facility]  Office  [phone]  CC: Primary care physician; Mickey FarberHIES, DAVID, MD

## 2016-05-02 NOTE — Progress Notes (Signed)
PT Cancellation Note  Patient Details Name: Catherine Anderson MRN: [id number] DOB: 06-Dec-1922   Cancelled Treatment:    Reason Eval/Treat Not Completed: Medical issues which prohibited therapy.  Pt is demonstrating some critical labs and has been symptomatic with c-diff, and will wait to tomorrow to see if her symptoms of labs improve.   Ivar DrapeStout, Camani Sesay E 05/02/2016, 8:08 AM   Samul Dadauth Brendon Christoffel, PT MS Acute Rehab Dept. Number: [facility] [id number] [phone] and [facility] [phone]

## 2016-05-02 NOTE — Progress Notes (Signed)
Pharmacist - Prescriber Communication  Colchicine dose has been changed from 0.6 mg po BID to 0.3 mg po daily for creatinine clearance less than 30 mL/min per Cockcroft-Gault equation. Question of ADE diarrhea x 2 in ED.   Genia Perin A. Picuris Pueblo[place]son, Vermont[place]arm.D., BCPS Clinical Pharmacist 05/02/2016 [phone]

## 2016-05-03 LAB — CBC
HEMATOCRIT: 28.2 % — AB (ref 35.0–47.0)
HEMOGLOBIN: 8.9 g/dL — AB (ref 12.0–16.0)
MCH: 29.5 pg (ref 26.0–34.0)
MCHC: 31.4 g/dL — AB (ref 32.0–36.0)
MCV: 93.8 fL (ref 80.0–100.0)
Platelets: 135 10*3/uL — ABNORMAL LOW (ref 150–440)
RBC: 3.01 MIL/uL — ABNORMAL LOW (ref 3.80–5.20)
RDW: 17.9 % — AB (ref 11.5–14.5)
WBC: 5.8 10*3/uL (ref 3.6–11.0)

## 2016-05-03 LAB — MAGNESIUM: Magnesium: 1.7 mg/dL (ref 1.7–2.4)

## 2016-05-03 LAB — BASIC METABOLIC PANEL
ANION GAP: 5 (ref 5–15)
BUN: 43 mg/dL — AB (ref 6–20)
CHLORIDE: 123 mmol/L — AB (ref 101–111)
CO2: 17 mmol/L — AB (ref 22–32)
Calcium: 8.2 mg/dL — ABNORMAL LOW (ref 8.9–10.3)
Creatinine, Ser: 2.76 mg/dL — ABNORMAL HIGH (ref 0.44–1.00)
GFR calc Af Amer: 16 mL/min — ABNORMAL LOW (ref >60)
GFR calc non Af Amer: 14 mL/min — ABNORMAL LOW (ref >60)
GLUCOSE: 104 mg/dL — AB (ref 65–99)
POTASSIUM: 3.8 mmol/L (ref 3.5–5.1)
Sodium: 145 mmol/L (ref 135–145)

## 2016-05-03 MED ORDER — KCL-LACTATED RINGERS-D5W 20 MEQ/L IV SOLN
INTRAVENOUS | Status: DC
  Administered 2016-05-03 – 2016-05-05 (×3): via INTRAVENOUS
  Filled 2016-05-03 (×6): qty 1000

## 2016-05-03 MED ORDER — HALOPERIDOL LACTATE 5 MG/ML IJ SOLN
1.0000 mg | Freq: Four times a day (QID) | INTRAMUSCULAR | Status: DC | PRN
  Administered 2016-05-05: 1 mg via INTRAVENOUS
  Filled 2016-05-03: qty 1

## 2016-05-03 MED ORDER — HALOPERIDOL 1 MG PO TABS
1.0000 mg | ORAL_TABLET | Freq: Two times a day (BID) | ORAL | Status: DC
  Administered 2016-05-03 – 2016-05-06 (×5): 1 mg via ORAL
  Filled 2016-05-03 (×7): qty 1

## 2016-05-03 MED ORDER — MAGNESIUM SULFATE 2 GM/50ML IV SOLN
2.0000 g | Freq: Once | INTRAVENOUS | Status: AC
  Administered 2016-05-03: 2 g via INTRAVENOUS
  Filled 2016-05-03: qty 50

## 2016-05-03 MED ORDER — SODIUM BICARBONATE 650 MG PO TABS
650.0000 mg | ORAL_TABLET | Freq: Four times a day (QID) | ORAL | Status: DC
  Administered 2016-05-03 – 2016-05-05 (×8): 650 mg via ORAL
  Filled 2016-05-03 (×8): qty 1

## 2016-05-03 NOTE — Evaluation (Signed)
Physical Therapy Evaluation Patient Details Name: Catherine Anderson MRN: [id number] DOB: 11/23/1922 Today's Date: 05/03/2016   History of Present Illness  Catherine Anderson  is a 82 y.o. female with a known history of Chronic kidney disease stage IV recent C. difficile infection currently on Flagyl who [facility] found to have abnormal blood work revealing worsening kidney function.   Clinical Impression  82 yo Female [facility]. Patient reports decreased functional mobility over last few months. She reports needing a lot of help with bathing/dressing. Her sister-in-law was present during evaluation and she states that she has been spending most of her time sitting in a chair. Patient is currently min A for bed mobility; She is mod A for sit<>Stand transfers with RW and mod A for taking a few steps to bedside chair. Patient demonstrates significant weakness in BLE. She would benefit from additional skilled PT intervention to improve strength, balance and gait safety;     Follow Up Recommendations SNF    Equipment Recommendations  None recommended by PT    Recommendations for Other Services Rehab consult     Precautions / Restrictions Precautions Precautions: Fall Restrictions Weight Bearing Restrictions: No      Mobility  Bed Mobility Overal bed mobility: Needs Assistance Bed Mobility: Supine to Sit     Supine to sit: Min assist     General bed mobility comments: patient uses bed rail but needs min A to get to full sitting; able to move LE independently  off bed;   Transfers Overall transfer level: Needs assistance Equipment used: Rolling walker (2 wheeled) Transfers: Sit to/from Stand Sit to Stand: Mod assist         General transfer comment: Patient required mod A for leaning forward and mod VCs for hand placement and to increase push through BLE;   Ambulation/Gait Ambulation/Gait assistance: Mod  assist Ambulation Distance (Feet): 2 Feet Assistive device: Rolling walker (2 wheeled) Gait Pattern/deviations: Step-to pattern;Decreased step length - right;Decreased step length - left;Decreased dorsiflexion - right;Decreased dorsiflexion - left;Wide base of support;Trunk flexed Gait velocity: decreased   General Gait Details: pt took a few steps to bedside chair; She has severely flexed trunk and demonstrates short step length;   Stairs            Wheelchair Mobility    Modified Rankin (Stroke Patients Only)       Balance Overall balance assessment: Needs assistance;History of Falls Sitting-balance support: Bilateral upper extremity supported;Feet supported Sitting balance-Leahy Scale: Fair   Postural control: Posterior lean Standing balance support: Bilateral upper extremity supported Standing balance-Leahy Scale: Poor Standing balance comment: uses RW                             Pertinent Vitals/Pain Pain Assessment: No/denies pain    Home Living Family/patient expects to be discharged to:: Skilled nursing facility ([facility])                      Prior Function                 Hand Dominance        Extremity/Trunk Assessment   Upper Extremity Assessment: Generalized weakness           Lower Extremity Assessment: RLE deficits/detail;LLE deficits/detail RLE Deficits / Details: hip and knee grossly 3/5; very lethargic; ROM  is WFL LLE Deficits / Details: hip and knee grossly 3/5; very lethargic; ROM is WFL  Cervical / Trunk Assessment: Kyphotic  Communication      Cognition Arousal/Alertness: Lethargic Behavior During Therapy: WFL for tasks assessed/performed Overall Cognitive Status: History of cognitive impairments - at baseline (oriented to person; not oriented to place or date)                      General Comments      Exercises     Assessment/Plan    PT Assessment Patient needs continued  PT services  PT Problem List Decreased strength;Decreased range of motion;Decreased activity tolerance;Decreased balance;Decreased mobility;Decreased knowledge of use of DME;Decreased safety awareness          PT Treatment Interventions DME instruction;Gait training;Functional mobility training;Therapeutic activities;Therapeutic exercise;Balance training;Neuromuscular re-education;Patient/family education;Modalities    PT Goals (Current goals can be found in the Care Plan section)  Acute Rehab PT Goals Patient Stated Goal: "I want to get stronger" PT Goal Formulation: With patient Time For Goal Achievement: 05/17/16 Potential to Achieve Goals: Fair    Frequency Min 2X/week   Barriers to discharge   pt from [facility]    Co-evaluation               End of Session Equipment Utilized During Treatment: Gait belt Activity Tolerance: Patient limited by fatigue Patient left: in chair;with call bell/phone within reach;with family/visitor present;with chair alarm set Nurse Communication: Mobility status         Time: 1450-1515 PT Time Calculation (min) (ACUTE ONLY): 25 min   Charges:   PT Evaluation $PT Eval Moderate Complexity: 1 Procedure     PT G Codes:        Dontavion Noxon PT, DPT 05/03/2016, 3:26 PM

## 2016-05-03 NOTE — Progress Notes (Signed)
[facility]  ROUNDING NOTE   Subjective:  Renal function appears to be better. Creatinine currently down to 2.7. She appears to be confused periodically.    Objective:  Vital signs in last 24 hours:  Temp:  [98.1 F (36.7 C)-98.6 F (37 C)] 98.1 F (36.7 C) (11/12 0452) Pulse Rate:  [79-102] 79 (11/12 1033) Resp:  [14-22] 22 (11/12 0452) BP: (138-157)/(63-109) 153/79 (11/12 1033) SpO2:  [91 %-98 %] 91 % (11/12 1033)  Weight change:  Filed Weights   05/01/16 1441  Weight: 52.4 kg (115 lb 8.3 oz)    Intake/Output: I/O last 3 completed shifts: In: 3421.3 [I.V.:3421.3] Out: 475 [Urine:475]   Intake/Output this shift:  No intake/output data recorded.  Physical Exam: General: No acute distress  Head: Normocephalic, atraumatic. Moist oral mucosal membranes  Eyes: Anicteric  Neck: Supple, trachea midline  Lungs:  Clear to auscultation, normal effort  Heart: S1S2 no rubs  Abdomen:  Soft, nontender, bowel sounds present  Extremities: trace peripheral edema.  Neurologic: Awake, follows commands, hard of hearing  Skin: No lesions       Basic Metabolic Panel:  Recent Labs Lab 05/01/16 1446 05/01/16 1807 05/02/16 0559 05/02/16 1454 05/03/16 0628  NA  --  139 142  --  145  K  --  2.6* 3.0* 3.0* 3.8  CL  --  109 119*  --  123*  CO2  --  21* 18*  --  17*  GLUCOSE  --  113* 90  --  104*  BUN  --  60* 51*  --  43*  CREATININE 3.81* 3.44* 3.32*  --  2.76*  CALCIUM  --  8.0* 7.8*  --  8.2*  MG  --   --  1.7  --  1.7    Liver Function Tests: No results for input(s): AST, ALT, ALKPHOS, BILITOT, PROT, ALBUMIN in the last 168 hours. No results for input(s): LIPASE, AMYLASE in the last 168 hours. No results for input(s): AMMONIA in the last 168 hours.  CBC:  Recent Labs Lab 05/01/16 1446 05/01/16 1807 05/02/16 0559 05/03/16 0628  WBC 6.5 6.1 5.5 5.8  NEUTROABS 4.9  --   --   --   HGB 9.3* 9.3* 8.3* 8.9*  HCT 28.8* 29.2* 25.3* 28.2*  MCV 91.4  92.0 90.4 93.8  PLT 150 143* 134* 135*    Cardiac Enzymes: No results for input(s): CKTOTAL, CKMB, CKMBINDEX, TROPONINI in the last 168 hours.  BNP: Invalid input(s): POCBNP  CBG: No results for input(s): GLUCAP in the last 168 hours.  Microbiology: Results for orders placed or performed during the hospital encounter of 05/01/16  MRSA PCR Screening     Status: None   Collection Time: 05/01/16  6:48 PM  Result Value Ref Range Status   MRSA by PCR NEGATIVE NEGATIVE Final    Comment:        The GeneXpert MRSA Assay (FDA approved for NASAL specimens only), is one component of a comprehensive MRSA colonization surveillance program. It is not intended to diagnose MRSA infection nor to guide or monitor treatment for MRSA infections.     Coagulation Studies: No results for input(s): LABPROT, INR in the last 72 hours.  Urinalysis:  Recent Labs  05/01/16 1446  COLORURINE YELLOW*  LABSPEC 1.008  PHURINE 6.0  GLUCOSEU NEGATIVE  HGBUR 1+*  BILIRUBINUR NEGATIVE  KETONESUR NEGATIVE  PROTEINUR 30*  NITRITE POSITIVE*  LEUKOCYTESUR 3+*      Imaging: US Renal  Result Date: 05/02/2016  CLINICAL DATA:  Acute on chronic renal failure EXAM: RENAL / URINARY TRACT ULTRASOUND COMPLETE COMPARISON:  04/18/2016 60 FINDINGS: Right Kidney: Length: 8.9. Similar marked atrophy and cortical thinning with increased echogenicity. No obstruction or hydronephrosis. Small upper pole cyst, difficult to visualize on today's exam measuring about 1 cm. Left Kidney: Length: 7.6 cm. Similar marked atrophy and cortical thinning with increased echogenicity. No renal obstruction or hydronephrosis. Bladder: Echogenic dependent debris within the bladder along the posterior wall appearing mobile. IMPRESSION: Severe renal cortical atrophy and thinning with increased echogenicity compatible with advanced medical renal disease. No hydronephrosis Dependent bladder debris. Electronically Signed   By: Jerilynn Mages.  Shick M.D.    On: 05/02/2016 09:47     Medications:   . dextrose 5% lactated ringers with KCl 20 mEq/L 150 mL/hr at 05/03/16 1143   . aspirin EC  325 mg Oral Daily  . diltiazem  120 mg Oral Daily  . ENSURE PLUS  120 mL Oral TID BM  . ferrous sulfate  325 mg Oral Q breakfast  . haloperidol  1 mg Oral BID  . heparin  5,000 Units Subcutaneous Q8H  . levothyroxine  62.5 mcg Oral QAC breakfast  . mirtazapine  15 mg Oral QHS  . sodium bicarbonate  650 mg Oral Q6H  . sodium chloride flush  3 mL Intravenous Q12H  . vancomycin  125 mg Oral Q6H   acetaminophen **OR** acetaminophen, haloperidol lactate, ondansetron **OR** ondansetron (ZOFRAN) IV, oxyCODONE  Assessment/ Plan:  82 y.o. female with hypertension, hyperlipidemia, hypothyroidism, osteoporosis, vitamin B12 deficiency, diet controlled diabetes mellitus type 2, hyperuricemia, peptic ulcer disease, excision of parotid gland tumor  1. Acute renal failure/chronic kidney disease stage IV baseline creatinine 2.2 with EGFR of 19.  - The patient's kidney function appears to be improving as her creatinine is currently down to 2.76 with a BUN of 43.  We will lower the rate of IV fluid hydration to 100 cc/h.  2. Anemia of chronic kidney disease. Hemoglobin up slightly to 8.9. Continue to monitor hemoglobin closely. We may need to consider Epogen as an outpatient.  3. Hypokalemia. Potassium up to 3.8 with repletion. Monitor serum potassium closely as the patient is on lactated Ringer's.  4. Metabolic acidosis. Serum bicarbonate remains low at 17. Continue sodium bicarbonate for right now.  5. Gout:  Colchicine discontinued.  LOS: 2 Shaan Rhoads 11/12/20172:22 PM

## 2016-05-03 NOTE — Progress Notes (Signed)
[facility] [place] [facility]   PATIENT NAME: Catherine HeadsLouise Anderson    MR#:  [id number]  DATE OF BIRTH:  [birth year]  SUBJECTIVE:  CHIEF COMPLAINT:   Chief Complaint  Patient presents with  . Abnormal Lab  The patient is a 82 year old Caucasian female with past medical history significant for history of CAD. Stage IV, recent C. difficile infection, who presented C. difficile diarrhea, worsening renal function. The patient was admitted to the hospital and initiated on vancomycin orally, she is being rehydrated with IV fluids. She feels somewhat better today, denies any abdominal pain, nausea, she feels that her diarrhea has subsided.  Developed V. tach episode Yesterday , magnesium remains low as 1.7, potassium level normalized, SVT intermittently today. Very confused.   Review of Systems  Unable to perform ROS: Dementia  Constitutional: Negative for chills, fever and weight loss.  HENT: Negative for congestion.   Eyes: Negative for blurred vision and double vision.  Respiratory: Negative for cough, sputum production, shortness of breath and wheezing.   Cardiovascular: Negative for chest pain, palpitations, orthopnea, leg swelling and PND.  Gastrointestinal: Positive for diarrhea. Negative for abdominal pain, blood in stool, constipation, nausea and vomiting.  Genitourinary: Negative for dysuria, frequency, hematuria and urgency.  Musculoskeletal: Negative for falls.  Neurological: Negative for dizziness, tremors, focal weakness and headaches.  Endo/Heme/Allergies: Does not bruise/bleed easily.  Psychiatric/Behavioral: Negative for depression. The patient does not have insomnia.     VITAL SIGNS: Blood pressure (!) 153/79, pulse 79, temperature 98.1 F (36.7 C), temperature source Oral, resp. rate (!) 22, height 4\' 11"  (1.499 m), weight 52.4 kg (115 lb 8.3 oz), SpO2 91 %.  PHYSICAL EXAMINATION:   GENERAL:  [age] y.o.-year-old patient lying in the bed with no acute  distress. Dry oral mucosa, confused, restless  EYES: Pupils equal, round, reactive to light and accommodation. No scleral icterus. Extraocular muscles intact.  HEENT: Head atraumatic, normocephalic. Oropharynx and nasopharynx clear.  NECK:  Supple, no jugular venous distention. No thyroid enlargement, no tenderness.  LUNGS: Normal breath sounds bilaterally, no wheezing, rales,rhonchi or crepitation. No use of accessory muscles of respiration.  CARDIOVASCULAR: S1, S2 normal. No murmurs, rubs, or gallops.  ABDOMEN: Soft, nontender, nondistended. Bowel sounds present. No organomegaly or mass.  EXTREMITIES: No pedal edema, cyanosis, or clubbing.  NEUROLOGIC: Cranial nerves II through XII are intact. Muscle strength 5/5 in all extremities. Sensation intact. Gait not checked.  PSYCHIATRIC: The patient is alert , disoriented  SKIN: No obvious rash, lesion, or ulcer.   ORDERS/RESULTS REVIEWED:   CBC  Recent Labs Lab 05/01/16 1446 05/01/16 1807 05/02/16 0559 05/03/16 0628  WBC 6.5 6.1 5.5 5.8  HGB 9.3* 9.3* 8.3* 8.9*  HCT 28.8* 29.2* 25.3* 28.2*  PLT 150 143* 134* 135*  MCV 91.4 92.0 90.4 93.8  MCH 29.4 29.3 29.7 29.5  MCHC 32.1 31.9* 32.8 31.4*  RDW 17.0* 17.2* 17.4* 17.9*  LYMPHSABS 0.8*  --   --   --   MONOABS 0.6  --   --   --   EOSABS 0.1  --   --   --   BASOSABS 0.1  --   --   --    ------------------------------------------------------------------------------------------------------------------  Chemistries   Recent Labs Lab 05/01/16 1446 05/01/16 1807 05/02/16 0559 05/02/16 1454 05/03/16 0628  NA  --  139 142  --  145  K  --  2.6* 3.0* 3.0* 3.8  CL  --  109 119*  --  123*  CO2  --  21* 18*  --  17*  GLUCOSE  --  113* 90  --  104*  BUN  --  60* 51*  --  43*  CREATININE 3.81* 3.44* 3.32*  --  2.76*  CALCIUM  --  8.0* 7.8*  --  8.2*  MG  --   --  1.7  --  1.7    ------------------------------------------------------------------------------------------------------------------ estimated creatinine clearance is 9.8 mL/min (by C-G formula based on SCr of 2.76 mg/dL (H)). ------------------------------------------------------------------------------------------------------------------ No results for input(s): TSH, T4TOTAL, T3FREE, THYROIDAB in the last 72 hours.  Invalid input(s): FREET3  Cardiac Enzymes No results for input(s): CKMB, TROPONINI, MYOGLOBIN in the last 168 hours.  Invalid input(s): CK ------------------------------------------------------------------------------------------------------------------ Invalid input(s): POCBNP ---------------------------------------------------------------------------------------------------------------  RADIOLOGY: [place] Renal  Result Date: 05/02/2016 CLINICAL DATA:  Acute on chronic renal failure EXAM: RENAL / URINARY TRACT ULTRASOUND COMPLETE COMPARISON:  04/18/2016 60 FINDINGS: Right Kidney: Length: 8.9. Similar marked atrophy and cortical thinning with increased echogenicity. No obstruction or hydronephrosis. Small upper pole cyst, difficult to visualize on today's exam measuring about 1 cm. Left Kidney: Length: 7.6 cm. Similar marked atrophy and cortical thinning with increased echogenicity. No renal obstruction or hydronephrosis. Bladder: Echogenic dependent debris within the bladder along the posterior wall appearing mobile. IMPRESSION: Severe renal cortical atrophy and thinning with increased echogenicity compatible with advanced medical renal disease. No hydronephrosis Dependent bladder debris. Electronically Signed   By: Judie PetitM.  Shick M.D.   On: 05/02/2016 09:47    EKG:  Orders placed or performed during the hospital encounter of 05/01/16  . EKG 12-Lead  . EKG 12-Lead    ASSESSMENT AND PLAN:  Active Problems:   Acute on chronic kidney failure (HCC) #1. Acute on chronic renal failure, , Improved  with IV fluid administration from a GFR of 92, GFR 14, baseline GFR of 18, per nephrologist, continue Foley catheter to record urinary output, , which remains minimal of 475 cc over the past 18-20 hours nephrology consultation isappreciated, renal ultrasound revealed renal atrophy, no hydronephrosis, bladder debris #2, pyuria, questionable urinary tract infection,. Awaiting for urinary  cultures, hold off antibiotic therapy due to C. difficile infection #3 C. difficile diarrhea, continue vancomycin orally,some improvement with the clinically, continue IV fluids,advance diet to dysphagia  #4. Hypokalemia, supplementing intravenously,  level normalized, magnesium level was marginal, supplementing intravenously #5. V. tach episode,  various arrhythmias, electrolytes have improved, continue metoprolol, follow on telemetry   Management plans discussed with the patient, family and they are in agreement.   DRUG ALLERGIES: No Known Allergies  CODE STATUS:     Code Status Orders        Start     Ordered   05/01/16 1618  Full code  Continuous     05/01/16 1617    Code Status History    Date Active Date Inactive Code Status Order ID Comments User Context   04/17/2016  9:45 PM 04/21/2016  7:28 PM Full Code [id number]  Enid Baasadhika Kalisetti, MD Inpatient   03/31/2016 11:07 PM 04/03/2016  7:00 PM Full Code [id number]  Oralia Manisavid Willis, MD ED      TOTAL TIME TAKING CARE OF THIS PATIENT: 40 minutes.    Katharina CaperVAICKUTE,Garreth Burnsworth M.D on 05/03/2016 at 2:11 PM  Between 7am to 6pm - Pager - [phone]  After 6pm go to [email or web address] - password [email or web address] [facility]  [facility]  Office  [phone]  CC: Primary care physician; Mickey FarberHIES, DAVID, MD

## 2016-05-03 NOTE — Plan of Care (Signed)
Problem: Education: Goal: Knowledge of [place] [facility] Education information/materials will improve Outcome: Not Progressing Patient confused this shift - unable to discuss education information

## 2016-05-03 NOTE — Progress Notes (Signed)
Pt with SVT on telemetry, returned to NSR. VSS. Prime doc notified. No new orders received. Will continue to monitor.

## 2016-05-04 ENCOUNTER — Inpatient Hospital Stay: Payer: Medicare Other

## 2016-05-04 DIAGNOSIS — E872 Acidosis, unspecified: Secondary | ICD-10-CM

## 2016-05-04 DIAGNOSIS — I4729 Other ventricular tachycardia: Secondary | ICD-10-CM

## 2016-05-04 DIAGNOSIS — I471 Supraventricular tachycardia: Secondary | ICD-10-CM

## 2016-05-04 DIAGNOSIS — R8281 Pyuria: Secondary | ICD-10-CM

## 2016-05-04 DIAGNOSIS — E860 Dehydration: Secondary | ICD-10-CM

## 2016-05-04 DIAGNOSIS — A0472 Enterocolitis due to Clostridium difficile, not specified as recurrent: Secondary | ICD-10-CM

## 2016-05-04 DIAGNOSIS — I472 Ventricular tachycardia: Secondary | ICD-10-CM

## 2016-05-04 DIAGNOSIS — E875 Hyperkalemia: Secondary | ICD-10-CM | POA: Insufficient documentation

## 2016-05-04 LAB — BASIC METABOLIC PANEL
ANION GAP: 4 — AB (ref 5–15)
BUN: 39 mg/dL — ABNORMAL HIGH (ref 6–20)
CHLORIDE: 119 mmol/L — AB (ref 101–111)
CO2: 21 mmol/L — AB (ref 22–32)
Calcium: 8.2 mg/dL — ABNORMAL LOW (ref 8.9–10.3)
Creatinine, Ser: 2.42 mg/dL — ABNORMAL HIGH (ref 0.44–1.00)
GFR calc non Af Amer: 16 mL/min — ABNORMAL LOW (ref >60)
GFR, EST AFRICAN AMERICAN: 19 mL/min — AB (ref >60)
GLUCOSE: 128 mg/dL — AB (ref 65–99)
Potassium: 4.2 mmol/L (ref 3.5–5.1)
Sodium: 144 mmol/L (ref 135–145)

## 2016-05-04 MED ORDER — MORPHINE SULFATE (PF) 4 MG/ML IV SOLN
1.0000 mg | INTRAVENOUS | Status: DC | PRN
  Filled 2016-05-04: qty 1

## 2016-05-04 MED ORDER — IPRATROPIUM-ALBUTEROL 0.5-2.5 (3) MG/3ML IN SOLN: 3.0000 mL | Freq: Four times a day (QID) | RESPIRATORY_TRACT | Status: DC | PRN

## 2016-05-04 MED ORDER — VANCOMYCIN 50 MG/ML ORAL SOLUTION: 125.0000 mg | Freq: Four times a day (QID) | ORAL | 0 refills | Status: DC

## 2016-05-04 MED ORDER — IPRATROPIUM-ALBUTEROL 0.5-2.5 (3) MG/3ML IN SOLN
RESPIRATORY_TRACT | Status: AC
  Administered 2016-05-04: 3 mL
  Filled 2016-05-04: qty 3

## 2016-05-04 NOTE — Progress Notes (Signed)
No charge note:   Palliative medicine consult received. Family meeting planned for 05/05/16 [id number] per patient's relative, Ernestene KielJoyce Zienna Ahlin.  Ocie BobKasie Seif Teichert, AGNP-C Palliative Medicine  Please call Palliative Medicine team phone with any questions [phone]. For individual providers please see AMION.

## 2016-05-04 NOTE — Progress Notes (Signed)
[facility]  ROUNDING NOTE   Subjective:   UOP 475 Confusion  Creatinine 2.42 (2.76)  Co 21 (17)  Objective:  Vital signs in last 24 hours:  Temp:  [98 F (36.7 C)-98.2 F (36.8 C)] 98.2 F (36.8 C) (11/12 2019) Pulse Rate:  [67-93] 93 (11/13 0344) Resp:  [19-28] 28 (11/13 0344) BP: (135-148)/(63-79) 137/79 (11/13 0344) SpO2:  [87 %-100 %] 100 % (11/13 0344)  Weight change:  Filed Weights   05/01/16 1441  Weight: 52.4 kg (115 lb 8.3 oz)    Intake/Output: I/O last 3 completed shifts: In: 2556 [I.V.:2556] Out: 725 [Urine:725]   Intake/Output this shift:  Total I/O In: 718.8 [P.O.:120; I.V.:598.8] Out: 150 [Urine:150]  Physical Exam: General: No acute distress  Head: Normocephalic, atraumatic. Moist oral mucosal membranes  Eyes: Anicteric  Neck: Supple, trachea midline  Lungs:  Clear to auscultation, normal effort  Heart: S1S2 no rubs  Abdomen:  Soft, nontender, bowel sounds present  Extremities: trace peripheral edema.  Neurologic: Awake, follows commands, hard of hearing, oriented to self only.   Skin: No lesions       Basic Metabolic Panel:  Recent Labs Lab 05/01/16 1446  05/01/16 1807 05/02/16 0559 05/02/16 1454 05/03/16 0628 05/04/16 0625  NA  --   --  139 142  --  145 144  K  --   --  2.6* 3.0* 3.0* 3.8 4.2  CL  --   --  109 119*  --  123* 119*  CO2  --   --  21* 18*  --  17* 21*  GLUCOSE  --   --  113* 90  --  104* 128*  BUN  --   --  60* 51*  --  43* 39*  CREATININE 3.81*  --  3.44* 3.32*  --  2.76* 2.42*  CALCIUM  --   < > 8.0* 7.8*  --  8.2* 8.2*  MG  --   --   --  1.7  --  1.7  --   < > = values in this interval not displayed.  Liver Function Tests: No results for input(s): AST, ALT, ALKPHOS, BILITOT, PROT, ALBUMIN in the last 168 hours. No results for input(s): LIPASE, AMYLASE in the last 168 hours. No results for input(s): AMMONIA in the last 168 hours.  CBC:  Recent Labs Lab 05/01/16 1446 05/01/16 1807  05/02/16 0559 05/03/16 0628  WBC 6.5 6.1 5.5 5.8  NEUTROABS 4.9  --   --   --   HGB 9.3* 9.3* 8.3* 8.9*  HCT 28.8* 29.2* 25.3* 28.2*  MCV 91.4 92.0 90.4 93.8  PLT 150 143* 134* 135*    Cardiac Enzymes: No results for input(s): CKTOTAL, CKMB, CKMBINDEX, TROPONINI in the last 168 hours.  BNP: Invalid input(s): POCBNP  CBG: No results for input(s): GLUCAP in the last 168 hours.  Microbiology: Results for orders placed or performed during the hospital encounter of 05/01/16  MRSA PCR Screening     Status: None   Collection Time: 05/01/16  6:48 PM  Result Value Ref Range Status   MRSA by PCR NEGATIVE NEGATIVE Final    Comment:        The GeneXpert MRSA Assay (FDA approved for NASAL specimens only), is one component of a comprehensive MRSA colonization surveillance program. It is not intended to diagnose MRSA infection nor to guide or monitor treatment for MRSA infections.     Coagulation Studies: No results for input(s): LABPROT, INR in the last  72 hours.  Urinalysis:  Recent Labs  05/01/16 1446  COLORURINE YELLOW*  LABSPEC 1.008  PHURINE 6.0  GLUCOSEU NEGATIVE  HGBUR 1+*  BILIRUBINUR NEGATIVE  KETONESUR NEGATIVE  PROTEINUR 30*  NITRITE POSITIVE*  LEUKOCYTESUR 3+*      Imaging: Dg Chest Port 1 View  Result Date: 05/04/2016 CLINICAL DATA:  Dyspnea EXAM: PORTABLE CHEST 1 VIEW COMPARISON:  03/31/2016 FINDINGS: Patchy left base opacity could represent infection or atelectasis. No large effusions. Unchanged moderate cardiomegaly. IMPRESSION: Mild patchy left base opacity, possible atelectasis or early infectious infiltrate. Electronically Signed   By: Andreas Newport M.D.   On: 05/04/2016 04:41     Medications:   . dextrose 5% lactated ringers with KCl 20 mEq/L 50 mL/hr at 05/04/16 1124   . aspirin EC  325 mg Oral Daily  . diltiazem  120 mg Oral Daily  . ENSURE PLUS  120 mL Oral TID BM  . ferrous sulfate  325 mg Oral Q breakfast  . haloperidol  1 mg  Oral BID  . heparin  5,000 Units Subcutaneous Q8H  . levothyroxine  62.5 mcg Oral QAC breakfast  . mirtazapine  15 mg Oral QHS  . sodium bicarbonate  650 mg Oral Q6H  . sodium chloride flush  3 mL Intravenous Q12H  . vancomycin  125 mg Oral Q6H   acetaminophen **OR** acetaminophen, haloperidol lactate, ipratropium-albuterol, morphine injection, ondansetron **OR** ondansetron (ZOFRAN) IV, oxyCODONE  Assessment/ Plan:  82 y.o. white female with hypertension, hyperlipidemia, hypothyroidism, osteoporosis, vitamin B12 deficiency, diet controlled diabetes mellitus type 2, hyperuricemia, peptic ulcer disease, excision of parotid gland tumor  1. Acute renal failure with metabolic acidosis on chronic kidney disease stage IV: with proteinuria and hematuria. baseline creatinine 2.2 with EGFR of 19 03/12/16 Acute renal failure secondary to prerenal azotemia from GI losses Chronic Kidney Disease secondary to hypertension. Patient does not want dialysis - Continue IV fluids for one more day: 96m/hr - Sodium bicarbonate  2. Anemia of chronic kidney disease. Hemoglobin 8.9 - not on outpatient epo  3. Hypertension: blood pressure at goal.  - diltiazem   LOS: 3[place] SMiami11/13/201712:33 PM

## 2016-05-04 NOTE — Discharge Summary (Signed)
[facility] - [place] at [facility]   PATIENT NAME: Catherine Anderson    MR#:  [id number]  DATE OF BIRTH:  Sep 27, 1922  DATE OF ADMISSION:  05/01/2016 ADMITTING PHYSICIAN: Wyatt Haste, MD  DATE OF DISCHARGE: No discharge date for patient encounter.  PRIMARY CARE PHYSICIAN: THIES, DAVID, MD     ADMISSION DIAGNOSIS:  Acute renal failure, unspecified acute renal failure type (HCC) [N17.9]  DISCHARGE DIAGNOSIS:  Principal Problem:   Acute on chronic kidney failure (HCC) Active Problems:   Pyuria   C. difficile diarrhea   Dehydration   Hypokalemia   Metabolic acidosis   Ventricular tachycardia, non-sustained (HCC)   SVT (supraventricular tachycardia) (HCC)   SECONDARY DIAGNOSIS:   Past Medical History:  Diagnosis Date  . CKD (chronic kidney disease), stage IV (HCC)   . Gout   . History of GI bleed   . HLD (hyperlipidemia)   . Hypertension   . Thyroid disease     .pro HOSPITAL COURSE:  The patient is a 82 year old Caucasian female with past medical history significant for history of CAD, recent C. difficile infection, who presented with dehydration, worsening renal function. The patient was admitted to the hospital and initiated on vancomycin orally, she was rehydrated with IV fluids. Diarrhea subsided.  While in the hospital patient was noted to be acidotic, hypokalemic, relatively hypomagnesemia, she had a few episodes of V. tach and SVT, which resolved with improvement of her condition, resolution of electrolyte abnormalities. Patient was also noted to be confused, improved on Haldol as needed . With conservative therapy. Her kidney function improved, creatinine improved to 2.42, estimated GFR of 14 from 3.81, estimated GFR of 9 on the day of admission. Patient was seen by nephrologist while in the hospital, recommended to consider Epogen as outpatient. He also recommended to discontinue colchicine, which was given for gout in the past due to renal  failure. The patient was seen by physical therapist and recommended skilled nursing facility placement. Discussion by problem: #1. Acute on chronic renal failure,  improved with IV fluid administration from a GFR of 9 to  GFR 16 today, baseline GFR of 18, per nephrologist,  renal ultrasound revealed renal atrophy, no hydronephrosis, bladder debris #2, pyuria, questionable urinary tract infection, the patient was not on antibiotic therapy due to C. difficile infection, follow for symptoms, urinary cultures. Initiate antibiotic therapy if symptomatic.  #3 C. difficile diarrhea, continue vancomycin orally for 12 more days to complete course, the patient has improved clinically, now on dysphagia 2 diet  , although patient's family brought bagel and she ate it vigorously. It is recommended to advance to regular diet as long as patient is able to tolerate. It is also recommended to follow her oral fluid intake and force fluid intake as much as possible to maintain hydration . Suspending Lasix until diarrhea resolves.  #4. Hypokalemia, supplemented intravenously,  level normalized, magnesium level was marginal, supplemented intravenously as well  #5. V. tach, nonsustained, SVT, likely due to electrolyte abnormalities, ectrolytes have improved, patient remains in sinus rhythm at present .   #6. Anemia of chronic disease, of CK D, nephrologist recommends to consider Epogen as outpatient.  #7. History of gout, colchicine is discontinued due to renal failure  DISCHARGE CONDITIONS:   Treatment Team:  Wyatt Haste, MD Mady Haagensen, MD  DRUG ALLERGIES:  No Known Allergies  DISCHARGE MEDICATIONS:   Current Discharge Medication List    START taking these medications   Details  vancomycin (  VANCOCIN) 50 mg/mL oral solution Take 2.5 mLs (125 mg total) by mouth every 6 (six) hours. Qty: 120 mL, Refills: 0      CONTINUE these medications which have NOT CHANGED   Details  aspirin EC 325 MG tablet Take 1  tablet (325 mg total) by mouth daily. Qty: 30 tablet, Refills: 0    Banana Flakes (BANATROL PLUS) PACK Take 1 packet by mouth 3 (three) times daily.    diltiazem (CARDIZEM CD) 120 MG 24 hr capsule Take 1 capsule (120 mg total) by mouth daily.    ENSURE PLUS (ENSURE PLUS) LIQD Take 120 mLs by mouth 3 (three) times daily between meals.    ferrous sulfate 325 (65 FE) MG tablet Take 325 mg by mouth daily with breakfast.    levothyroxine (SYNTHROID, LEVOTHROID) 125 MCG tablet Take 0.5 tablets (62.5 mcg total) by mouth daily before breakfast.    mirtazapine (REMERON SOL-TAB) 15 MG disintegrating tablet Take 15 mg by mouth at bedtime.    sodium bicarbonate 650 MG tablet Take 1 tablet (650 mg total) by mouth 2 (two) times daily. Qty: 10 tablet, Refills: 0    traMADol (ULTRAM) 50 MG tablet Take 1 tablet (50 mg total) by mouth every 6 (six) hours as needed. Qty: 20 tablet, Refills: 0      STOP taking these medications     colchicine 0.6 MG tablet      hydrALAZINE (APRESOLINE) 25 MG tablet      metroNIDAZOLE (FLAGYL) 500 MG tablet          . Patient is to follow-up with primary care physician within one week after dischargeIf you experience worsening of your admission symptoms, develop shortness of breath, life threatening emergency, suicidal or homicidal thoughts you must seek medical attention immediately by calling 911 or calling your MD immediately  if symptoms less severe.  You Must read complete instructions/literature along with all the possible adverse reactions/side effects for all the Medicines you take and that have been prescribed to you. Take any new Medicines after you have completely understood and accept all the possible adverse reactions/side effects.   Please note  You were cared for by a hospitalist during your hospital stay. If you have any questions about your discharge medications or the care you received while you were in the hospital after you are discharged, you  can call the unit and asked to speak with the hospitalist on call if the hospitalist that took care of you is not available. Once you are discharged, your primary care physician will handle any further medical issues. Please note that NO REFILLS for any discharge medications will be authorized once you are discharged, as it is imperative that you return to your primary care physician (or establish a relationship with a primary care physician if you do not have one) for your aftercare needs so that they can reassess your need for medications and monitor your lab values.    Today   CHIEF COMPLAINT:   Chief Complaint  Patient presents with  . Abnormal Lab    HISTORY OF PRESENT ILLNESS:  [name]  is a 82 y.o. female with a known history of CAD, recent C. difficile infection, who presented with dehydration, worsening renal function. The patient was admitted to the hospital and initiated on vancomycin orally, she was rehydrated with IV fluids. Diarrhea subsided.  While in the hospital patient was noted to be acidotic, hypokalemic, relatively hypomagnesemia, she had a few episodes of V. tach and SVT,  which resolved with improvement of her condition, resolution of electrolyte abnormalities. Patient was also noted to be confused, improved on Haldol as needed . With conservative therapy. Her kidney function improved, creatinine improved to 2.42, estimated GFR of 14 from 3.81, estimated GFR of 9 on the day of admission. Patient was seen by nephrologist while in the hospital, recommended to consider Epogen as outpatient. He also recommended to discontinue colchicine, which was given for gout in the past due to renal failure. The patient was seen by physical therapist and recommended skilled nursing facility placement. Discussion by problem: #1. Acute on chronic renal failure,  improved with IV fluid administration from a GFR of 9 to  GFR 16 today, baseline GFR of 18, per nephrologist,  renal ultrasound  revealed renal atrophy, no hydronephrosis, bladder debris #2, pyuria, questionable urinary tract infection, the patient was not on antibiotic therapy due to C. difficile infection, follow for symptoms, urinary cultures. Initiate antibiotic therapy if symptomatic.  #3 C. difficile diarrhea, continue vancomycin orally for 12 more days to complete course, the patient has improved clinically, now on dysphagia 2 diet  , although patient's family brought bagel and she ate it vigorously. It is recommended to advance to regular diet as long as patient is able to tolerate. It is also recommended to follow her oral fluid intake and force fluid intake as much as possible to maintain hydration . Suspending Lasix until diarrhea resolves.  #4. Hypokalemia, supplemented intravenously,  level normalized, magnesium level was marginal, supplemented intravenously as well  #5. V. tach, nonsustained, SVT, likely due to electrolyte abnormalities, ectrolytes have improved, patient remains in sinus rhythm at present .   #6. Anemia of chronic disease, of CK D, nephrologist recommends to consider Epogen as outpatient.  #7. History of gout, colchicine is discontinued due to renal failure     VITAL SIGNS:  Blood pressure 137/79, pulse 93, temperature 98.2 F (36.8 C), temperature source Oral, resp. rate (!) 28, height 4\' 11"  (1.499 m), weight 52.4 kg (115 lb 8.3 oz), SpO2 100 %.  I/O:   Intake/Output Summary (Last 24 hours) at 05/04/16 1128 Last data filed at 05/04/16 1100  Gross per 24 hour  Intake          2257.95 ml  Output              625 ml  Net          1632.95 ml    PHYSICAL EXAMINATION:  GENERAL:  82 y.o.-year-old patient lying in the bed with no acute distress.  EYES: Pupils equal, round, reactive to light and accommodation. No scleral icterus. Extraocular muscles intact.  HEENT: Head atraumatic, normocephalic. Oropharynx and nasopharynx clear.  NECK:  Supple, no jugular venous distention. No thyroid  enlargement, no tenderness.  LUNGS: Normal breath sounds bilaterally, no wheezing, rales,rhonchi or crepitation. No use of accessory muscles of respiration.  CARDIOVASCULAR: S1, S2 normal. No murmurs, rubs, or gallops.  ABDOMEN: Soft, non-tender, non-distended. Bowel sounds present. No organomegaly or mass.  EXTREMITIES: No pedal edema, cyanosis, or clubbing.  NEUROLOGIC: Cranial nerves II through XII are intact. Muscle strength 5/5 in all extremities. Sensation intact. Gait not checked.  PSYCHIATRIC: The patient is alert and oriented x 3.  SKIN: No obvious rash, lesion, or ulcer.   DATA REVIEW:   CBC  Recent Labs Lab 05/03/16 0628  WBC 5.8  HGB 8.9*  HCT 28.2*  PLT 135*    Chemistries   Recent Labs Lab 05/03/16 0628 05/04/16 1610  NA 145 144  K 3.8 4.2  CL 123* 119*  CO2 17* 21*  GLUCOSE 104* 128*  BUN 43* 39*  CREATININE 2.76* 2.42*  CALCIUM 8.2* 8.2*  MG 1.7  --     Cardiac Enzymes No results for input(s): TROPONINI in the last 168 hours.  Microbiology Results  Results for orders placed or performed during the hospital encounter of 05/01/16  MRSA PCR Screening     Status: None   Collection Time: 05/01/16  6:48 PM  Result Value Ref Range Status   MRSA by PCR NEGATIVE NEGATIVE Final    Comment:        The GeneXpert MRSA Assay (FDA approved for NASAL specimens only), is one component of a comprehensive MRSA colonization surveillance program. It is not intended to diagnose MRSA infection nor to guide or monitor treatment for MRSA infections.     RADIOLOGY:  Dg Chest Port 1 View  Result Date: 05/04/2016 CLINICAL DATA:  Dyspnea EXAM: PORTABLE CHEST 1 VIEW COMPARISON:  03/31/2016 FINDINGS: Patchy left base opacity could represent infection or atelectasis. No large effusions. Unchanged moderate cardiomegaly. IMPRESSION: Mild patchy left base opacity, possible atelectasis or early infectious infiltrate. Electronically Signed   By: Ellery Plunkaniel R Mitchell M.D.    On: 05/04/2016 04:41    EKG:   Orders placed or performed during the hospital encounter of 05/01/16  . EKG 12-Lead  . EKG 12-Lead      Management plans discussed with the patient, family and they are in agreement.  CODE STATUS:     Code Status Orders        Start     Ordered   05/01/16 1618  Full code  Continuous     05/01/16 1617    Code Status History    Date Active Date Inactive Code Status Order ID Comments User Context   04/17/2016  9:45 PM 04/21/2016  7:28 PM Full Code [id number]  Enid Baasadhika Kalisetti, MD Inpatient   03/31/2016 11:07 PM 04/03/2016  7:00 PM Full Code [id number]  Oralia Manisavid Willis, MD ED      TOTAL TIME TAKING CARE OF THIS PATIENT: 40 minutes.    Katharina CaperVAICKUTE,Ceria Suminski M.D on 05/04/2016 at 11:28 AM  Between 7am to 6pm - Pager - [phone]  After 6pm go to [email or web address] - password [email or web address] [facility]  [facility]  Office  [phone]  CC: Primary care physician; Mickey FarberHIES, DAVID, MD

## 2016-05-04 NOTE — Progress Notes (Signed)
Pt was found to be panting upon assessment. Pts RR was 28 with HR of 95. Pt denied feeling SOB or having any pain. Pt was wearing 1L O2 via nasal cannula. Respiratory was called and came to assess her. Recommended a venti mask until her breathing slowed. Called to update Dr. Sheryle Hailiamond and he came and assessed the pt as well. Dr. Sheryle Hailiamond placed new orders.

## 2016-05-04 NOTE — Care Management Important Message (Signed)
Important Message  Patient Details  Name: Catherine Anderson MRN: [id number] Date of Birth: [birth year]   Medicare Important Message Given:  Yes    Chapman FitchBOWEN, Nyesha Cliff T, RN 05/04/2016, 1:15 PM

## 2016-05-05 DIAGNOSIS — E860 Dehydration: Secondary | ICD-10-CM

## 2016-05-05 DIAGNOSIS — Z515 Encounter for palliative care: Secondary | ICD-10-CM

## 2016-05-05 DIAGNOSIS — Z7189 Other specified counseling: Secondary | ICD-10-CM

## 2016-05-05 DIAGNOSIS — N185 Chronic kidney disease, stage 5: Secondary | ICD-10-CM

## 2016-05-05 DIAGNOSIS — N179 Acute kidney failure, unspecified: Principal | ICD-10-CM

## 2016-05-05 LAB — BASIC METABOLIC PANEL
Anion gap: 1 — ABNORMAL LOW (ref 5–15)
BUN: 40 mg/dL — ABNORMAL HIGH (ref 6–20)
CALCIUM: 8.2 mg/dL — AB (ref 8.9–10.3)
CO2: 22 mmol/L (ref 22–32)
CREATININE: 2.25 mg/dL — AB (ref 0.44–1.00)
Chloride: 120 mmol/L — ABNORMAL HIGH (ref 101–111)
GFR, EST AFRICAN AMERICAN: 21 mL/min — AB (ref >60)
GFR, EST NON AFRICAN AMERICAN: 18 mL/min — AB (ref >60)
Glucose, Bld: 110 mg/dL — ABNORMAL HIGH (ref 65–99)
Potassium: 5.2 mmol/L — ABNORMAL HIGH (ref 3.5–5.1)
SODIUM: 143 mmol/L (ref 135–145)

## 2016-05-05 LAB — POTASSIUM
POTASSIUM: 5.2 mmol/L — AB (ref 3.5–5.1)
POTASSIUM: 5.6 mmol/L — AB (ref 3.5–5.1)

## 2016-05-05 LAB — GLUCOSE, CAPILLARY
GLUCOSE-CAPILLARY: 84 mg/dL (ref 65–99)
GLUCOSE-CAPILLARY: 111 mg/dL — AB (ref 65–99)
Glucose-Capillary: 92 mg/dL (ref 65–99)

## 2016-05-05 LAB — ALBUMIN: Albumin: 2.5 g/dL — ABNORMAL LOW (ref 3.5–5.0)

## 2016-05-05 MED ORDER — MIRTAZAPINE 15 MG PO TBDP
30.0000 mg | ORAL_TABLET | Freq: Every day | ORAL | Status: DC
  Administered 2016-05-06 – 2016-05-07 (×2): 30 mg via ORAL
  Filled 2016-05-05 (×4): qty 2

## 2016-05-05 MED ORDER — GLYCOPYRROLATE 0.2 MG/ML IJ SOLN
0.2000 mg | INTRAMUSCULAR | Status: DC | PRN
  Filled 2016-05-05: qty 1

## 2016-05-05 MED ORDER — POLYVINYL ALCOHOL 1.4 % OP SOLN
1.0000 [drp] | Freq: Four times a day (QID) | OPHTHALMIC | Status: DC | PRN
  Filled 2016-05-05: qty 15

## 2016-05-05 MED ORDER — HALOPERIDOL LACTATE 5 MG/ML IJ SOLN: 1.0000 mg | Freq: Four times a day (QID) | INTRAMUSCULAR | Status: DC | PRN

## 2016-05-05 MED ORDER — SODIUM POLYSTYRENE SULFONATE 15 GM/60ML PO SUSP: 30.0000 g | Freq: Once | ORAL | Status: DC

## 2016-05-05 MED ORDER — DEXTROSE 50 % IV SOLN
25.0000 mL | Freq: Once | INTRAVENOUS | Status: AC
  Administered 2016-05-05: 25 mL via INTRAVENOUS

## 2016-05-05 MED ORDER — GLYCOPYRROLATE 1 MG PO TABS
1.0000 mg | ORAL_TABLET | ORAL | Status: DC | PRN
  Filled 2016-05-05: qty 1

## 2016-05-05 MED ORDER — MORPHINE SULFATE (CONCENTRATE) 10 MG/0.5ML PO SOLN: 5.0000 mg | ORAL | Status: DC | PRN

## 2016-05-05 MED ORDER — ONDANSETRON 4 MG PO TBDP: 4.0000 mg | ORAL_TABLET | Freq: Four times a day (QID) | ORAL | Status: DC | PRN

## 2016-05-05 MED ORDER — BIOTENE DRY MOUTH MT LIQD: 15.0000 mL | OROMUCOSAL | Status: DC | PRN

## 2016-05-05 MED ORDER — TRAZODONE HCL 50 MG PO TABS: 25.0000 mg | ORAL_TABLET | Freq: Every evening | ORAL | Status: DC | PRN

## 2016-05-05 MED ORDER — ONDANSETRON HCL 4 MG/2ML IJ SOLN: 4.0000 mg | Freq: Four times a day (QID) | INTRAMUSCULAR | Status: DC | PRN

## 2016-05-05 NOTE — Consult Note (Signed)
Consultation Note Date: 05/05/2016   Patient Name: Catherine Anderson  DOB: 03-Feb-1923  MRN: [id number]  Age / Sex: 82 y.o., female  PCP: Ezequiel Kayser, MD Referring Physician: Theodoro Grist, MD  Reason for Consultation: Establishing goals of care  HPI/Patient Profile: 82 y.o. female  with past medical history of chronic kidney disease, C. Diff, HTN, gout, GI bleed admitted on 05/01/2016 with dehydration and acute on chronic kidney failure. GFR has been as low as 9, today is up to 19 as result of IV fluid rehydration. Patient has been confused during admission, not taking in oral nutrition or hydration. She has received treatment with oral vanc for C.Diff but has continued to have diarrhea despite treatment.   Clinical Assessment and Goals of Care: Met with patient's family today. Met with her sister-in-law, Blanch Media, and nephews- Merry Proud and Gerald Stabs who have been her primary caretakers. Also present were several other nephews and nieces. They are patient's closest surviving family. They have noted a steep decline in patient's cognitive, functional and nutritional status over the last several months. She has lost a great deal of weight. She has had increase in lethargy.  This decline was precipitated by a fall that lead to her being transferred to a skilled nursing facility. She now is not eating, not drinking, confused. They report she is talking to dead loved ones and telling her family she will see them in heaven. She is sleeping more than she is awake with very little sedative medication.  All family is in agreement that she is dying. They would like to pursue comfort measures, avoid hospitalization, and seek residential hospice placement if possible.   Primary Decision Maker NEXT OF KIN- Kaleen Mask and Gerald Stabs    SUMMARY OF RECOMMENDATIONS -Patient is dying- she is unable to maintain electrolytes with diarrhea (she has had  runs of Vtach this admission), kidney failure (not candidate for HD) and no po intake. IV rehydration has helped, but family understands this is not a permanent fix and are in agreement with ceasing life prolonging measures and focusing on comfort.  -Comfort measures -DNR -Consult for residential hospice evaluation and placement pending bed availability ---SOB:   -Morphine intensol (26m/0.5mL) 593mSL q1hr prn moderate  SOB and as premed for any exertional activity  -Fan blowing directly in face  -Keep room cool --Anxiety/Agitation:  -lorazepam 53m72mL or IV q 4 hours prn  -haldol .5mg65mD  -haldol .5mg 89mr prn  -Trazadone 25mg 73mprn --Pulmonary edema/Audible Terminal Secretions:  -1 mg po or .2mg Ro69mul IV or SL q 4 hrs prn --Stop IV fluids, stop IV antibiotics --Stop all meds that do not focus on comfort --Consult to SW for residential hospice placement     Code Status/Advance Care Planning:  DNR    Symptom Management:   As above  Palliative Prophylaxis:   Delirium Protocol  Additional Recommendations (Limitations, Scope, Preferences):  Avoid Hospitalization, Minimize Medications, Initiate Comfort Feeding, No Artificial Feeding, No Blood Transfusions, No Chemotherapy, No Diagnostics, No Glucose Monitoring, No  Hemodialysis, No IV Antibiotics, No IV Fluids, No Lab Draws, No Radiation, No Surgical Procedures and No Tracheostomy  Psycho-social/Spiritual:   Desire for further Chaplaincy support:No  Additional Recommendations: Education on Hospice  Prognosis:    < 2 weeks- Expect patient will continue to decline quickly once IV hydration and antibiotics are stopped d/t FTT, kidney failure and C. Diff  Discharge Planning: Hospice facility pending hospice evaluation and bed availability  Primary Diagnoses: Present on Admission: . Acute on chronic kidney failure ([place])   I have reviewed the medical record, interviewed the patient and family, and examined the patient.  The following aspects are pertinent.  Past Medical History:  Diagnosis Date  . CKD (chronic kidney disease), stage IV ([place])   . Gout   . History of GI bleed   . HLD (hyperlipidemia)   . Hypertension   . Thyroid disease    Social History   Social History  . Marital status: Widowed    Spouse name: N/A  . Number of children: N/A  . Years of education: N/A   Social History Main Topics  . Smoking status: Never Smoker  . Smokeless tobacco: Never Used  . Alcohol use No  . Drug use: No  . Sexual activity: Not Asked   Other Topics Concern  . None   Social History Narrative  . None   Family History  Problem Relation Age of Onset  . Aortic aneurysm Mother   . Gout Brother   . Heart disease Brother    Scheduled Meds: . aspirin EC  325 mg Oral Daily  . diltiazem  120 mg Oral Daily  . ENSURE PLUS  120 mL Oral TID BM  . haloperidol  1 mg Oral BID  . mirtazapine  30 mg Oral QHS  . sodium chloride flush  3 mL Intravenous Q12H   Continuous Infusions: PRN Meds:.acetaminophen **OR** acetaminophen, antiseptic oral rinse, glycopyrrolate **OR** glycopyrrolate **OR** glycopyrrolate, haloperidol lactate, ipratropium-albuterol, morphine CONCENTRATE **OR** morphine CONCENTRATE, ondansetron **OR** ondansetron (ZOFRAN) IV, oxyCODONE, polyvinyl alcohol, traZODone Medications Prior to Admission:  Prior to Admission medications   Medication Sig Start Date End Date Taking? Authorizing Provider  aspirin EC 325 MG tablet Take 1 tablet (325 mg total) by mouth daily. 04/03/16  Yes Dustin Flock, MD  Banana Flakes (BANATROL PLUS) PACK Take 1 packet by mouth 3 (three) times daily.   Yes Historical Provider, MD  colchicine 0.6 MG tablet Take 1 tablet (0.6 mg total) by mouth 2 (two) times daily. 12/29/15 01/04/17 Yes Daymon Larsen, MD  diltiazem (CARDIZEM CD) 120 MG 24 hr capsule Take 1 capsule (120 mg total) by mouth daily. 04/03/16  Yes Dustin Flock, MD  ENSURE PLUS (ENSURE PLUS) LIQD Take 120 mLs  by mouth 3 (three) times daily between meals.   Yes Historical Provider, MD  ferrous sulfate 325 (65 FE) MG tablet Take 325 mg by mouth daily with breakfast.   Yes Historical Provider, MD  hydrALAZINE (APRESOLINE) 25 MG tablet Take 1 tablet (25 mg total) by mouth every 8 (eight) hours. 04/21/16  Yes Epifanio Lesches, MD  levothyroxine (SYNTHROID, LEVOTHROID) 125 MCG tablet Take 0.5 tablets (62.5 mcg total) by mouth daily before breakfast. 04/04/16  Yes Dustin Flock, MD  metroNIDAZOLE (FLAGYL) 500 MG tablet Take 1 tablet (500 mg total) by mouth every 8 (eight) hours. 04/21/16  Yes Epifanio Lesches, MD  mirtazapine (REMERON SOL-TAB) 15 MG disintegrating tablet Take 15 mg by mouth at bedtime.   Yes Historical Provider, MD  sodium bicarbonate  650 MG tablet Take 1 tablet (650 mg total) by mouth 2 (two) times daily. 04/21/16  Yes Epifanio Lesches, MD  traMADol (ULTRAM) 50 MG tablet Take 1 tablet (50 mg total) by mouth every 6 (six) hours as needed. 04/21/16 04/21/17 Yes Epifanio Lesches, MD  vancomycin (VANCOCIN) 50 mg/mL oral solution Take 2.5 mLs (125 mg total) by mouth every 6 (six) hours. 05/04/16   Theodoro Grist, MD   No Known Allergies Review of Systems  Unable to perform ROS: Mental status change    Physical Exam  Constitutional:  Cachetic, frail, elderly  Cardiovascular:  Irregular rhythm, pulses weak  Pulmonary/Chest: Effort normal and breath sounds normal.  Neurological:  Somnolent, difficult to arouse, does not follow commands  Skin: Skin is warm and dry. There is pallor.  Psychiatric:  somnolent    Vital Signs: BP 139/75 (BP Location: Left Arm)   Pulse 79   Temp 98.1 F (36.7 C) (Oral)   Resp 17   Ht _0  (1.499 m)   Wt 52.4 kg (115 lb 8.3 oz)   SpO2 95%   BMI 23.33 kg/m  Pain Assessment: No/denies pain   Pain Score: Asleep   SpO2: SpO2: 95 % O2 Device:SpO2: 95 % O2 Flow Rate: .O2 Flow Rate (L/min): 2 L/min  IO: Intake/output summary:    Intake/Output Summary (Last 24 hours) at 05/05/16 1633 Last data filed at 05/05/16 1117  Gross per 24 hour  Intake          1315.06 ml  Output             1000 ml  Net           315.06 ml    LBM: Last BM Date: 05/04/16 Baseline Weight: Weight: 52.4 kg (115 lb 8.3 oz) Most recent weight: Weight: 52.4 kg (115 lb 8.3 oz)     Palliative Assessment/Data: PPS: 10%   Flowsheet Rows   Flowsheet Row Most Recent Value  Intake Tab  Referral Department  Hospitalist  Unit at Time of Referral  Med/Surg Unit  Palliative Care Primary Diagnosis  Cardiac  Date Notified  05/03/16  Palliative Care Type  New Palliative care  Reason for referral  Clarify Goals of Care  Date of Admission  05/01/16  # of days IP prior to Palliative referral  2  Clinical Assessment  Psychosocial & Spiritual Assessment  Palliative Care Outcomes      Thank you for this consult. Palliative medicine will continue to follow and assist as needed.   Time In: 1515 Time Out: 1630 Time Total: 75 minutes Greater than 50%  of this time was spent counseling and coordinating care related to the above assessment and plan.  Signed by: Mariana Kaufman, AGNP-C Palliative Medicine    Please contact Palliative Medicine Team phone at [phone] for questions and concerns.  For individual provider: See Shea Evans

## 2016-05-05 NOTE — Progress Notes (Signed)
[facility] -  at [facility]   PATIENT NAME: Catherine Anderson    MR#:  [id number]  DATE OF BIRTH:  22-Nov-1922  SUBJECTIVE:  CHIEF COMPLAINT:   Chief Complaint  Patient presents with  . Abnormal Lab  The patient is a 82 year old Caucasian female with past medical history significant for history of CAD, recent C. difficile infection, who presentedwith dehydration, worsening renal function. The patient was admitted to the hospital and initiated on vancomycin orally, she was rehydrated with IV fluids. Diarrhea subsided. While in the hospital patient was noted to be acidotic, hypokalemic, relatively hypomagnesemia, she had a few episodes of V. tach and SVT, which resolved with improvement of her condition, resolution of electrolyte abnormalities. Patient was also noted to be confused, improved on Haldol as needed . With conservative therapy. Her kidney function improved, creatinine improved to 2.42, estimated GFR of 14 from 3.81, estimated GFR of 9 on the day of admission. Patient was seen by nephrologist while in the hospital, recommended to consider Epogen as outpatient. He also recommended to discontinue colchicine, which was given for gout in the past due to renal failure. The patient was given Haldol last night and is very somnolent today. Potassium is high at 5.2, Fluid was given, potassium remains high at 5.2., Creatinine has improved to 2.25 on IV fluids.   Review of Systems  Unable to perform ROS: Dementia  Constitutional: Negative for chills, fever and weight loss.  HENT: Negative for congestion.   Eyes: Negative for blurred vision and double vision.  Respiratory: Negative for cough, sputum production, shortness of breath and wheezing.   Cardiovascular: Negative for chest pain, palpitations, orthopnea, leg swelling and PND.  Gastrointestinal: Positive for diarrhea. Negative for abdominal pain, blood in stool, constipation, nausea and vomiting.   Genitourinary: Negative for dysuria, frequency, hematuria and urgency.  Musculoskeletal: Negative for falls.  Neurological: Negative for dizziness, tremors, focal weakness and headaches.  Endo/Heme/Allergies: Does not bruise/bleed easily.  Psychiatric/Behavioral: Negative for depression. The patient does not have insomnia.     VITAL SIGNS: Blood pressure 137/75, pulse 81, temperature 97.7 F (36.5 C), temperature source Oral, resp. rate 16, height 4\' 11"  (1.499 m), weight 52.4 kg (115 lb 8.3 oz), SpO2 100 %.  PHYSICAL EXAMINATION:   GENERAL:  82 y.o.-year-old patient lying in the bed with no acute distress. Dry oral mucosa, confused, restless  EYES: Pupils equal, round, reactive to light and accommodation. No scleral icterus. Extraocular muscles intact.  HEENT: Head atraumatic, normocephalic. Oropharynx and nasopharynx clear.  NECK:  Supple, no jugular venous distention. No thyroid enlargement, no tenderness.  LUNGS: Normal breath sounds bilaterally, no wheezing, rales,rhonchi or crepitation. No use of accessory muscles of respiration.  CARDIOVASCULAR: S1, S2 normal. No murmurs, rubs, or gallops.  ABDOMEN: Soft, nontender, nondistended. Bowel sounds present. No organomegaly or mass.  EXTREMITIES: No pedal edema, cyanosis, or clubbing.  NEUROLOGIC: Cranial nerves II through XII are intact. Muscle strength 5/5 in all extremities. Sensation intact. Gait not checked.  PSYCHIATRIC: The patient is alert , disoriented  SKIN: No obvious rash, lesion, or ulcer.   ORDERS/RESULTS REVIEWED:   CBC  Recent Labs Lab 05/01/16 1446 05/01/16 1807 05/02/16 0559 05/03/16 0628  WBC 6.5 6.1 5.5 5.8  HGB 9.3* 9.3* 8.3* 8.9*  HCT 28.8* 29.2* 25.3* 28.2*  PLT 150 143* 134* 135*  MCV 91.4 92.0 90.4 93.8  MCH 29.4 29.3 29.7 29.5  MCHC 32.1 31.9* 32.8 31.4*  RDW 17.0* 17.2* 17.4* 17.9*  LYMPHSABS 0.8*  --   --   --  MONOABS 0.6  --   --   --   EOSABS 0.1  --   --   --   BASOSABS 0.1  --   --   --     ------------------------------------------------------------------------------------------------------------------  Chemistries   Recent Labs Lab 05/01/16 1807 05/02/16 0559 05/02/16 1454 05/03/16 0628 05/04/16 0625 05/05/16 0429 05/05/16 1218  NA 139 142  --  145 144 143  --   K 2.6* 3.0* 3.0* 3.8 4.2 5.2* 5.2*  CL 109 119*  --  123* 119* 120*  --   CO2 21* 18*  --  17* 21* 22  --   GLUCOSE 113* 90  --  104* 128* 110*  --   BUN 60* 51*  --  43* 39* 40*  --   CREATININE 3.44* 3.32*  --  2.76* 2.42* 2.25*  --   CALCIUM 8.0* 7.8*  --  8.2* 8.2* 8.2*  --   MG  --  1.7  --  1.7  --   --   --    ------------------------------------------------------------------------------------------------------------------ estimated creatinine clearance is 12.1 mL/min (by C-G formula based on SCr of 2.25 mg/dL (H)). ------------------------------------------------------------------------------------------------------------------ No results for input(s): TSH, T4TOTAL, T3FREE, THYROIDAB in the last 72 hours.  Invalid input(s): FREET3  Cardiac Enzymes No results for input(s): CKMB, TROPONINI, MYOGLOBIN in the last 168 hours.  Invalid input(s): CK ------------------------------------------------------------------------------------------------------------------ Invalid input(s): POCBNP ---------------------------------------------------------------------------------------------------------------  RADIOLOGY: Dg Chest Port 1 View  Result Date: 05/04/2016 CLINICAL DATA:  Dyspnea EXAM: PORTABLE CHEST 1 VIEW COMPARISON:  03/31/2016 FINDINGS: Patchy left base opacity could represent infection or atelectasis. No large effusions. Unchanged moderate cardiomegaly. IMPRESSION: Mild patchy left base opacity, possible atelectasis or early infectious infiltrate. Electronically Signed   By: Ellery Plunk M.D.   On: 05/04/2016 04:41    EKG:  Orders placed or performed during the hospital encounter of  05/01/16  . EKG 12-Lead  . EKG 12-Lead    ASSESSMENT AND PLAN:  Principal Problem:   Acute on chronic kidney failure (HCC) Active Problems:   Pyuria   C. difficile diarrhea   Dehydration   Hypokalemia   Metabolic acidosis   Ventricular tachycardia, non-sustained (HCC)   SVT (supraventricular tachycardia) (HCC) #1. Acute on chronic renal failure, , Improved with IV fluid administration from a GFR of 9 to  GFR 18, baseline GFR of 18, per nephrologist, discontinue Foley catheter unless patient is being planned for hospice facility,  renal ultrasound revealed renal atrophy, no hydronephrosis, bladder debris #2, pyuria, questionable urinary tract infection,. Urine cultures are not reported, hold off antibiotic therapy due to C. difficile infection, patient is afebrile, white blood cell count is normal, unlikely infection #3 C. difficile diarrhea, patient is receiving vancomycin orally, however, no significant improvement clinically, as she continues to have loose mucousy stools and oral intake remains poor. Palliative care to discuss with family further treatment options, discuss goals of care  #4. Hyperkalemia, patient is ordered kayexalate , rechecking potassium level later today #5. V. tach episode,  various arrhythmias, electrolytes have improved, continue metoprolol, stable on telemetry, in normal sinus rhythm #6 failure to thrive adult, patient is not eating. Despite conservative therapy for C. difficile diarrhea, palliative care to meet up with family to   discuss goals of care   Management plans discussed with the patient, family and they are in agreement.   DRUG ALLERGIES: No Known Allergies  CODE STATUS:     Code Status Orders        Start  Ordered   05/01/16 1618  Full code  Continuous     05/01/16 1617    Code Status History    Date Active Date Inactive Code Status Order ID Comments User Context   04/17/2016  9:45 PM 04/21/2016  7:28 PM Full Code [id number]   Enid Baasadhika Kalisetti, MD Inpatient   03/31/2016 11:07 PM 04/03/2016  7:00 PM Full Code [id number]  Oralia Manisavid Willis, MD ED      TOTAL TIME TAKING CARE OF THIS PATIENT: 40 minutes.    Katharina CaperVAICKUTE,Disa Riedlinger M.D on 05/05/2016 at 1:10 PM  Between 7am to 6pm - Pager - [phone]  After 6pm go to [email or web address] - password [email or web address] [facility]  [facility]  Office  [phone]  CC: Primary care physician; Mickey FarberHIES, DAVID, MD

## 2016-05-05 NOTE — Progress Notes (Addendum)
PT Cancellation Note  Patient Details Name: Catherine Anderson MRN: [id number] DOB: 20-Nov-1922   Cancelled Treatment:    Reason Eval/Treat Not Completed: Fatigue/lethargy limiting ability to participate. Treatment attempted; unable to awaken pt. According to nursing staff, pt has been in this state all day; therefore, former discharge order was removed. Will re attempt treatment tomorrow.    Scot DockHeidi E Hiroko Tregre, PTA 05/05/2016, 3:09 PM

## 2016-05-05 NOTE — NC FL2 (Signed)
[place] MEDICAID FL2 LEVEL OF CARE SCREENING TOOL     IDENTIFICATION  Patient Name: Catherine Anderson Birthdate: 12-Oct-1922 Sex: female Admission Date (Current Location): 05/01/2016  [place]   Facility and Address:  [facility], [address], [place], [place] [address]      Provider Number: [id number]  Attending Physician Name and Address:  Katharina Caperima Vaickute, MD  Relative Name and Phone Number:       Current Level of Care: Hospital Recommended Level of Care: Skilled Nursing Facility Prior Approval Number:    Date Approved/Denied:   PASRR Number: [id number]  Discharge Plan: SNF    Current Diagnoses: Patient Active Problem List   Diagnosis Date Noted  . Pyuria 05/04/2016  . C. difficile diarrhea 05/04/2016  . Dehydration 05/04/2016  . Hypokalemia 05/04/2016  . Metabolic acidosis 05/04/2016  . Ventricular tachycardia, non-sustained (HCC) 05/04/2016  . SVT (supraventricular tachycardia) (HCC) 05/04/2016  . Acute on chronic kidney failure (HCC) 05/01/2016  . HTN (hypertension) 03/31/2016  . HLD (hyperlipidemia) 03/31/2016  . CKD (chronic kidney disease), stage IV (HCC) 03/31/2016  . Hypothyroidism 03/31/2016    Orientation RESPIRATION BLADDER Height & Weight      (none)  Normal Incontinent Weight: 115 lb 8.3 oz (52.4 kg) Height:  4\' 11"  (149.9 cm)  BEHAVIORAL SYMPTOMS/MOOD NEUROLOGICAL BOWEL NUTRITION STATUS   (none)  (none) Incontinent Diet (dysphagia 2)  AMBULATORY STATUS COMMUNICATION OF NEEDS Skin   Total Care Non-Verbally Normal                       Personal Care Assistance Level of Assistance  Total care Bathing Assistance: Maximum assistance Feeding assistance: Maximum assistance Dressing Assistance: Maximum assistance Total Care Assistance: Maximum assistance   Functional Limitations Info  Sight, Hearing Sight Info: Impaired Hearing Info: Impaired      SPECIAL CARE FACTORS FREQUENCY                        Contractures Contractures Info: Not present    Additional Factors Info  Code Status, Allergies   Allergies Info: nka           Current Medications (05/05/2016):  This is the current hospital active medication list Current Facility-Administered Medications  Medication Dose Route Frequency Provider Last Rate Last Dose  . acetaminophen (TYLENOL) tablet 650 mg  650 mg Oral Q6H PRN Wyatt Hasteavid K Hower, MD       Or  . acetaminophen (TYLENOL) suppository 650 mg  650 mg Rectal Q6H PRN Wyatt Hasteavid K Hower, MD      . aspirin EC tablet 325 mg  325 mg Oral Daily Wyatt Hasteavid K Hower, MD   325 mg at 05/04/16 1011  . diltiazem (CARDIZEM CD) 24 hr capsule 120 mg  120 mg Oral Daily Wyatt Hasteavid K Hower, MD   120 mg at 05/04/16 1012  . ENSURE PLUS (ENSURE PLUS) liquid 120 mL  120 mL Oral TID BM Wyatt Hasteavid K Hower, MD   120 mL at 05/04/16 1900  . ferrous sulfate tablet 325 mg  325 mg Oral Q breakfast Wyatt Hasteavid K Hower, MD   325 mg at 05/04/16 1011  . haloperidol (HALDOL) tablet 1 mg  1 mg Oral BID Katharina Caperima Vaickute, MD   1 mg at 05/04/16 2157  . haloperidol lactate (HALDOL) injection 1 mg  1 mg Intravenous Q6H PRN Katharina Caperima Vaickute, MD   1 mg at 05/05/16 0039  . heparin injection 5,000  Units  5,000 Units Subcutaneous Q8H Wyatt Hasteavid K Hower, MD   5,000 Units at 05/05/16 0507  . ipratropium-albuterol (DUONEB) 0.5-2.5 (3) MG/3ML nebulizer solution 3 mL  3 mL Nebulization Q6H PRN Arnaldo NatalMichael S Diamond, MD      . levothyroxine (SYNTHROID, LEVOTHROID) tablet 62.5 mcg  62.5 mcg Oral QAC breakfast Wyatt Hasteavid K Hower, MD   62.5 mcg at 05/04/16 1012  . mirtazapine (REMERON SOL-TAB) disintegrating tablet 15 mg  15 mg Oral QHS Wyatt Hasteavid K Hower, MD   15 mg at 05/04/16 2157  . morphine 4 MG/ML injection 1 mg  1 mg Intravenous Q3H PRN Arnaldo NatalMichael S Diamond, MD      . ondansetron [facility](ZOFRAN) tablet 4 mg  4 mg Oral Q6H PRN Wyatt Hasteavid K Hower, MD       Or  . ondansetron [facility](ZOFRAN) injection 4 mg  4 mg Intravenous Q6H PRN Wyatt Hasteavid K Hower, MD      . oxyCODONE (Oxy  IR/ROXICODONE) immediate release tablet 5 mg  5 mg Oral Q4H PRN Wyatt Hasteavid K Hower, MD   5 mg at 05/04/16 1522  . sodium bicarbonate tablet 650 mg  650 mg Oral Q6H Katharina Caperima Vaickute, MD   650 mg at 05/05/16 0507  . sodium chloride flush (NS) 0.9 % injection 3 mL  3 mL Intravenous Q12H Wyatt Hasteavid K Hower, MD   3 mL at 05/05/16 1000  . vancomycin (VANCOCIN) 50 mg/mL oral solution 125 mg  125 mg Oral Q6H Wyatt Hasteavid K Hower, MD   125 mg at 05/05/16 0303     Discharge Medications: Please see discharge summary for a list of discharge medications.  Relevant Imaging Results:  Relevant Lab Results:   Additional Information    York SpanielMonica Mont Jagoda, LCSW

## 2016-05-05 NOTE — Clinical Social Work Note (Signed)
Clinical Social Work Assessment  Patient Details  Name: Catherine Anderson MRN: [id number] Date of Birth: 23-Jan-1923  Date of referral:  05/05/16               Reason for consult:  Facility Placement                Permission sought to share information with:    Permission granted to share information::     Name::        Agency::     Relationship::     Contact Information:     Housing/Transportation Living arrangements for the past 2 months:  [facility]illed [facility] survey[facility]ursing Facility Source of Information:  Facility Patient Interpreter Needed:  None Criminal Activity/Legal Involvement Pertinent to Current Situation/Hospitalization:  No - Comment as needed Significant Relationships:   (nephew) Lives with:  Facility Resident Do you feel safe going back to the place where you live?    Need for family participation in patient care:  Yes (Comment)  Care giving concerns:  [facility]   Social Worker assessment / plan:  Patient is a Field [facility]readmit [facility]. CSW on weekend put documentation in chart but assessment needed to be completed. [facility]. Palliative Care to meet with family today as patient's prognosis is poor.   Employment status:  Retired Health and safety inspectornsurance information:  Medicare PT Recommendations:  Not assessed at this time Information / Referral to community resources:     Patient/Family's Response to care:  Uncertain at this time.  Patient/Family's Understanding of and Emotional Response to Diagnosis, Current Treatment, and Prognosis:  Uncertain at this time.  Emotional Assessment Appearance:  Appears stated age Attitude/Demeanor/Rapport:  Unable to Assess Affect (typically observed):  Unable to Assess Orientation:  Fluctuating Orientation (Suspected and/or reported Sundowners) Alcohol / Substance use:  Not Applicable Psych involvement (Current and /or in the community):  No (Comment)  Discharge Needs   Concerns to be addressed:    Readmission within the last 30 days:  Yes Current discharge risk:  None Barriers to Discharge:  No Barriers Identified   York SpanielMonica Autry Droege, LCSW 05/05/2016, 3:49 PM

## 2016-05-05 NOTE — Progress Notes (Signed)
[facility]  ROUNDING NOTE   Subjective:   K 5.2  Given haloperidol last night, now very somnolent    Objective:  Vital signs in last 24 hours:  Temp:  [97.7 F (36.5 C)-98.1 F (36.7 C)] 97.7 F (36.5 C) (11/14 0704) Pulse Rate:  [81-102] 81 (11/14 0936) Resp:  [16-36] 16 (11/14 0936) BP: (134-137)/(72-77) 137/75 (11/14 0936) SpO2:  [98 %-100 %] 100 % (11/14 0936)  Weight change:  Filed Weights   05/01/16 1441  Weight: 52.4 kg (115 lb 8.3 oz)    Intake/Output: I/O last 3 completed shifts: In: 6962 [P.O.:120; I.V.:2237] Out: 1775 [Urine:1775]   Intake/Output this shift:  Total I/O In: 150 [P.O.:150] Out: 150 [Urine:150]  Physical Exam: General: No acute distress  Head: Normocephalic, atraumatic. Moist oral mucosal membranes  Eyes: Anicteric  Neck: Supple, trachea midline  Lungs:  Clear to auscultation, normal effort  Heart: S1S2 no rubs  Abdomen:  Soft, nontender, bowel sounds present  Extremities: trace peripheral edema.  Neurologic: Awake, follows commands, hard of hearing, oriented to self only.   Skin: No lesions       Basic Metabolic Panel:  Recent Labs Lab 05/01/16 1807 05/02/16 0559 05/02/16 1454 05/03/16 0628 05/04/16 0625 05/05/16 0429  NA 139 142  --  145 144 143  K 2.6* 3.0* 3.0* 3.8 4.2 5.2*  CL 109 119*  --  123* 119* 120*  CO2 21* 18*  --  17* 21* 22  GLUCOSE 113* 90  --  104* 128* 110*  BUN 60* 51*  --  43* 39* 40*  CREATININE 3.44* 3.32*  --  2.76* 2.42* 2.25*  CALCIUM 8.0* 7.8*  --  8.2* 8.2* 8.2*  MG  --  1.7  --  1.7  --   --     Liver Function Tests: No results for input(s): AST, ALT, ALKPHOS, BILITOT, PROT, ALBUMIN in the last 168 hours. No results for input(s): LIPASE, AMYLASE in the last 168 hours. No results for input(s): AMMONIA in the last 168 hours.  CBC:  Recent Labs Lab 05/01/16 1446 05/01/16 1807 05/02/16 0559 05/03/16 0628  WBC 6.5 6.1 5.5 5.8  NEUTROABS 4.9  --   --   --   HGB 9.3* 9.3*  8.3* 8.9*  HCT 28.8* 29.2* 25.3* 28.2*  MCV 91.4 92.0 90.4 93.8  PLT 150 143* 134* 135*    Cardiac Enzymes: No results for input(s): CKTOTAL, CKMB, CKMBINDEX, TROPONINI in the last 168 hours.  BNP: Invalid input(s): POCBNP  CBG:  Recent Labs Lab 05/05/16 1025 05/05/16 1101  GLUCAP 22 111*    Microbiology: Results for orders placed or performed during the hospital encounter of 05/01/16  MRSA PCR Screening     Status: None   Collection Time: 05/01/16  6:48 PM  Result Value Ref Range Status   MRSA by PCR NEGATIVE NEGATIVE Final    Comment:        The GeneXpert MRSA Assay (FDA approved for NASAL specimens only), is one component of a comprehensive MRSA colonization surveillance program. It is not intended to diagnose MRSA infection nor to guide or monitor treatment for MRSA infections.     Coagulation Studies: No results for input(s): LABPROT, INR in the last 72 hours.  Urinalysis: No results for input(s): COLORURINE, LABSPEC, PHURINE, GLUCOSEU, HGBUR, BILIRUBINUR, KETONESUR, PROTEINUR, UROBILINOGEN, NITRITE, LEUKOCYTESUR in the last 72 hours.  Invalid input(s): APPERANCEUR    Imaging: Dg Chest Port 1 View  Result Date: 05/04/2016 CLINICAL DATA:  Dyspnea  EXAM: PORTABLE CHEST 1 VIEW COMPARISON:  03/31/2016 FINDINGS: Patchy left base opacity could represent infection or atelectasis. No large effusions. Unchanged moderate cardiomegaly. IMPRESSION: Mild patchy left base opacity, possible atelectasis or early infectious infiltrate. Electronically Signed   By: Andreas Newport M.D.   On: 05/04/2016 04:41     Medications:    . aspirin EC  325 mg Oral Daily  . diltiazem  120 mg Oral Daily  . ENSURE PLUS  120 mL Oral TID BM  . ferrous sulfate  325 mg Oral Q breakfast  . haloperidol  1 mg Oral BID  . heparin  5,000 Units Subcutaneous Q8H  . levothyroxine  62.5 mcg Oral QAC breakfast  . mirtazapine  15 mg Oral QHS  . sodium bicarbonate  650 mg Oral Q6H  . sodium  chloride flush  3 mL Intravenous Q12H  . vancomycin  125 mg Oral Q6H   acetaminophen **OR** acetaminophen, haloperidol lactate, ipratropium-albuterol, morphine injection, ondansetron **OR** ondansetron (ZOFRAN) IV, oxyCODONE  Assessment/ Plan:  82 y.o. white female with hypertension, hyperlipidemia, hypothyroidism, osteoporosis, vitamin B12 deficiency, diet controlled diabetes mellitus type 2, hyperuricemia, peptic ulcer disease, excision of parotid gland tumor  1. Acute renal failure with metabolic acidosis and hyperkalemia on chronic kidney disease stage IV: with proteinuria and hematuria. baseline creatinine 2.2 with EGFR of 19 03/12/16 Acute renal failure secondary to prerenal azotemia from GI losses Chronic Kidney Disease secondary to hypertension. Patient does not want dialysis - Discontinue IV fluids - Sodium bicarbonate  2. Anemia of chronic kidney disease. Hemoglobin 8.9 - not on outpatient epo  3. Hypertension: blood pressure at goal.  - diltiazem  Palliative care consulted.    LOS: [place], [place] 11/14/201711:33 AM

## 2016-05-05 NOTE — Progress Notes (Signed)
Discontinue Kayexelate per Dr. Wynelle LinkKolluru.

## 2016-05-06 DIAGNOSIS — Z515 Encounter for palliative care: Secondary | ICD-10-CM

## 2016-05-06 DIAGNOSIS — Z7189 Other specified counseling: Secondary | ICD-10-CM

## 2016-05-06 LAB — RENAL FUNCTION PANEL
ALBUMIN: 2.5 g/dL — AB (ref 3.5–5.0)
ANION GAP: 4 — AB (ref 5–15)
BUN: 42 mg/dL — AB (ref 6–20)
CALCIUM: 8.6 mg/dL — AB (ref 8.9–10.3)
CO2: 22 mmol/L (ref 22–32)
Chloride: 117 mmol/L — ABNORMAL HIGH (ref 101–111)
Creatinine, Ser: 2.28 mg/dL — ABNORMAL HIGH (ref 0.44–1.00)
GFR calc Af Amer: 20 mL/min — ABNORMAL LOW (ref >60)
GFR calc non Af Amer: 18 mL/min — ABNORMAL LOW (ref >60)
GLUCOSE: 87 mg/dL (ref 65–99)
PHOSPHORUS: 3.5 mg/dL (ref 2.5–4.6)
POTASSIUM: 5.6 mmol/L — AB (ref 3.5–5.1)
SODIUM: 143 mmol/L (ref 135–145)

## 2016-05-06 MED ORDER — PATIROMER SORBITEX CALCIUM 8.4 G PO PACK
8.4000 g | PACK | Freq: Every day | ORAL | Status: DC
  Administered 2016-05-07 – 2016-05-08 (×2): 8.4 g via ORAL
  Filled 2016-05-06 (×3): qty 4

## 2016-05-06 NOTE — Progress Notes (Signed)
PT Cancellation Note  Patient Details Name: Catherine Anderson MRN: [id number] DOB: 1922/10/21   Cancelled Treatment:    Reason Eval/Treat Not Completed: Other (comment). Pt was seen by palliative care 05/05/2016 with closest surviving family/caretakers present. Family in agreement the pt is at end of life and they are seeking comfort measures only. Complete current PT orders.    Scot DockHeidi E Mery Guadalupe, PTA 05/06/2016, 9:34 AM

## 2016-05-06 NOTE — Progress Notes (Signed)
New referral for Hospice Home received from CSW [employer] Anterhaus. Minerva Areolaric informed that there are currently no beds available at this time.  Dayna BarkerKaren Robertson RN, BSN, [facility] [place], [facility] Lia[facility] [phone].

## 2016-05-06 NOTE — Progress Notes (Signed)
[facility] - [place] at [facility]   PATIENT NAME: Catherine HeadsLouise Epp    MR#:  [id number]  DATE OF BIRTH:  [birth year]  SUBJECTIVE:  CHIEF COMPLAINT:   Chief Complaint  Patient presents with  . Abnormal Lab  The patient is a 82 year old Caucasian female with past medical history significant for history of CAD, recent C. difficile infection, who presentedwith dehydration, worsening renal function. The patient was admitted to the hospital and initiated on vancomycin orally, she was rehydrated with IV fluids. Diarrhea subsided. While in the hospital patient was noted to be acidotic, hypokalemic, relatively hypomagnesemia, she had a few episodes of V. tach and SVT, which resolved with improvement of her condition, resolution of electrolyte abnormalities. Patient was also noted to be confused, improved on Haldol as needed . With conservative therapy. Her kidney function improved, creatinine improved to 2.42, estimated GFR of 14 from 3.81, estimated GFR of 9 on the day of admission. Patient was seen by nephrologist while in the hospital, recommended to consider Epogen as outpatient. He also recommended to discontinue colchicine, which was given for gout in the past due to renal failure. The patient was given Haldol last night and is very somnolent today. Potassium is high at 5.2, Fluid was given, potassium remains high at 5.2., Creatinine has improved to 2.25 on IV fluids.   Review of Systems  Unable to perform ROS: Dementia  Constitutional: Negative for chills, fever and weight loss.  HENT: Negative for congestion.   Eyes: Negative for blurred vision and double vision.  Respiratory: Negative for cough, sputum production, shortness of breath and wheezing.   Cardiovascular: Negative for chest pain, palpitations, orthopnea, leg swelling and PND.  Gastrointestinal: Positive for diarrhea. Negative for abdominal pain, blood in stool, constipation, nausea and vomiting.   Genitourinary: Negative for dysuria, frequency, hematuria and urgency.  Musculoskeletal: Negative for falls.  Neurological: Negative for dizziness, tremors, focal weakness and headaches.  Endo/Heme/Allergies: Does not bruise/bleed easily.  Psychiatric/Behavioral: Negative for depression. The patient does not have insomnia.     VITAL SIGNS: Blood pressure 139/82, pulse 92, temperature 98.1 F (36.7 C), temperature source Oral, resp. rate 18, height 4\' 11"  (1.499 m), weight 52.4 kg (115 lb 8.3 oz), SpO2 95 %.  PHYSICAL EXAMINATION:   GENERAL:  82 y.o.-year-old patient lying in the bed with no acute distress. Dry oral mucosa, confused, restless  EYES: Pupils equal, round, reactive to light and accommodation. No scleral icterus. Extraocular muscles intact.  HEENT: Head atraumatic, normocephalic. Oropharynx and nasopharynx clear.  NECK:  Supple, no jugular venous distention. No thyroid enlargement, no tenderness.  LUNGS: Normal breath sounds bilaterally, no wheezing, rales,rhonchi or crepitation. No use of accessory muscles of respiration.  CARDIOVASCULAR: S1, S2 normal. No murmurs, rubs, or gallops.  ABDOMEN: Soft, nontender, nondistended. Bowel sounds present. No organomegaly or mass.  EXTREMITIES: No pedal edema, cyanosis, or clubbing.  NEUROLOGIC: Cranial nerves II through XII are intact. Muscle strength 5/5 in all extremities. Sensation intact. Gait not checked.  PSYCHIATRIC: The patient is alert , disoriented  SKIN: No obvious rash, lesion, or ulcer.   ORDERS/RESULTS REVIEWED:   CBC  Recent Labs Lab 05/01/16 1446 05/01/16 1807 05/02/16 0559 05/03/16 0628  WBC 6.5 6.1 5.5 5.8  HGB 9.3* 9.3* 8.3* 8.9*  HCT 28.8* 29.2* 25.3* 28.2*  PLT 150 143* 134* 135*  MCV 91.4 92.0 90.4 93.8  MCH 29.4 29.3 29.7 29.5  MCHC 32.1 31.9* 32.8 31.4*  RDW 17.0* 17.2* 17.4* 17.9*  LYMPHSABS 0.8*  --   --   --  MONOABS 0.6  --   --   --   EOSABS 0.1  --   --   --   BASOSABS 0.1  --   --   --     ------------------------------------------------------------------------------------------------------------------  Chemistries   Recent Labs Lab 05/02/16 0559  05/03/16 0628 05/04/16 0625 05/05/16 0429 05/05/16 1218 05/05/16 1903 05/06/16 0828  NA 142  --  145 144 143  --   --  143  K 3.0*  < > 3.8 4.2 5.2* 5.2* 5.6* 5.6*  CL 119*  --  123* 119* 120*  --   --  117*  CO2 18*  --  17* 21* 22  --   --  22  GLUCOSE 90  --  104* 128* 110*  --   --  87  BUN 51*  --  43* 39* 40*  --   --  42*  CREATININE 3.32*  --  2.76* 2.42* 2.25*  --   --  2.28*  CALCIUM 7.8*  --  8.2* 8.2* 8.2*  --   --  8.6*  MG 1.7  --  1.7  --   --   --   --   --   < > = values in this interval not displayed. ------------------------------------------------------------------------------------------------------------------ estimated creatinine clearance is 11.9 mL/min (by C-G formula based on SCr of 2.28 mg/dL (H)). ------------------------------------------------------------------------------------------------------------------ No results for input(s): TSH, T4TOTAL, T3FREE, THYROIDAB in the last 72 hours.  Invalid input(s): FREET3  Cardiac Enzymes No results for input(s): CKMB, TROPONINI, MYOGLOBIN in the last 168 hours.  Invalid input(s): CK ------------------------------------------------------------------------------------------------------------------ Invalid input(s): POCBNP ---------------------------------------------------------------------------------------------------------------  RADIOLOGY: No results found.  EKG:  Orders placed or performed during the hospital encounter of 05/01/16  . EKG 12-Lead  . EKG 12-Lead    ASSESSMENT AND PLAN:  Principal Problem:   Acute on chronic kidney failure (HCC) Active Problems:   Pyuria   C. difficile diarrhea   Dehydration   Hypokalemia   Metabolic acidosis   Ventricular tachycardia, non-sustained (HCC)   SVT (supraventricular tachycardia)  (HCC)   Terminal care   Goals of care, counseling/discussion   Palliative care by specialist   Advance care planning #1. Acute on chronic renal failure,  Initially Improved with IV fluid administration from a GFR of 9 to  GFR 18, baseline GFR of 18, per nephrologist, patient's family decided on hospice home placement, continue Foley catheter for end-of-life care. Renal ultrasound revealed renal atrophy, no hydronephrosis, bladder debris #2, pyuria, questionable urinary tract infection,. Urine cultures are not reported, hold off antibiotic therapy due to C. difficile infection, patient is afebrile, white blood cell count is normal, unlikely infection #3 C. difficile diarrhea, patient is receiving vancomycin orally, however, no significant improvement was noted clinically, although only one stool was reported last night, still remains very loose and oral intake remains poor. Palliative care discussed with family treatment options, goals of care and decided on hospice. Patient will likely be discharged to hospice home, although no beds available today.  #4. Hyperkalemia, patient was ordered kayexalate, potassium level did not improve, no more labs will be checked due to comfort care measures #5. V. tach episode,  various arrhythmias, continue metoprolol, discontinue telemetry #6 failure to thrive adult, patient has very poor oral intake, although she requested some food during my presence. Supportive therapy, dysphagia diet. Initiating comfort care measures.    Management plans discussed with the patient, family and they are in agreement.   DRUG ALLERGIES: No Known Allergies  CODE STATUS:  Code Status Orders        Start     Ordered   05/01/16 1618  Full code  Continuous     05/01/16 1617    Code Status History    Date Active Date Inactive Code Status Order ID Comments User Context   04/17/2016  9:45 PM 04/21/2016  7:28 PM Full Code [id number]  Enid Baasadhika Kalisetti, MD Inpatient    03/31/2016 11:07 PM 04/03/2016  7:00 PM Full Code [id number]  Oralia Manisavid Willis, MD ED      TOTAL TIME TAKING CARE OF THIS PATIENT: 40 minutes.    Katharina CaperVAICKUTE,Glada Wickstrom M.D on 05/06/2016 at 1:38 PM  Between 7am to 6pm - Pager - [phone]  After 6pm go to [email or web address] - password [email or web address] [facility]  [facility]  Office  [phone]  CC: Primary care physician; Mickey FarberHIES, DAVID, MD

## 2016-05-06 NOTE — Clinical Social Work Note (Addendum)
MSW spoke to Ernestene KielJoyce Mahan, patient's relative [phone] to discuss hospice placement options.  Patient's family would like patient to go to [facility].  MSW contacted [facility] nurse liason to complete referral.  Hospice House nurse liason, stated the hospice house does not currently have any beds available, but will add patient to the wait list.  MSW notified patient's relative Alona BeneJoyce, MSW to continue to follow patient's progress throughout discharge planning.  Ervin KnackEric R. Lyndall Windt, MSW [phone]  Mon-Fri 8a-4:30p 05/06/2016 1:05 PM

## 2016-05-07 LAB — BASIC METABOLIC PANEL
Anion gap: 5 (ref 5–15)
BUN: 44 mg/dL — ABNORMAL HIGH (ref 6–20)
CALCIUM: 8.9 mg/dL (ref 8.9–10.3)
CO2: 22 mmol/L (ref 22–32)
CREATININE: 2.27 mg/dL — AB (ref 0.44–1.00)
Chloride: 115 mmol/L — ABNORMAL HIGH (ref 101–111)
GFR, EST AFRICAN AMERICAN: 21 mL/min — AB (ref >60)
GFR, EST NON AFRICAN AMERICAN: 18 mL/min — AB (ref >60)
Glucose, Bld: 104 mg/dL — ABNORMAL HIGH (ref 65–99)
Potassium: 5.8 mmol/L — ABNORMAL HIGH (ref 3.5–5.1)
SODIUM: 142 mmol/L (ref 135–145)

## 2016-05-07 LAB — POTASSIUM: POTASSIUM: 4.8 mmol/L (ref 3.5–5.1)

## 2016-05-07 MED ORDER — SODIUM CHLORIDE 0.9 % IV SOLN
INTRAVENOUS | Status: DC
  Administered 2016-05-07 – 2016-05-08 (×2): via INTRAVENOUS

## 2016-05-07 MED ORDER — HALOPERIDOL 1 MG PO TABS: 1.0000 mg | ORAL_TABLET | Freq: Two times a day (BID) | ORAL | Status: DC | PRN

## 2016-05-07 NOTE — Progress Notes (Signed)
[facility] - [place] [facility]   PATIENT NAME: Catherine Anderson    MR#:  [id number]  DATE OF BIRTH:  12/08/1922  SUBJECTIVE:  CHIEF COMPLAINT:   Chief Complaint  Patient presents with  . Abnormal Lab  The patient is a 82 year old Caucasian female with past medical history significant for history of CAD, recent C. difficile infection, who presentedwith dehydration, worsening renal function. The patient was admitted to the hospital and initiated on vancomycin orally, she was rehydrated with IV fluids. Diarrhea subsided. While in the hospital patient was noted to be acidotic, hypokalemic, relatively hypomagnesemia, she had a few episodes of V. tach and SVT, which resolved with improvement of her condition, resolution of electrolyte abnormalities. Patient was also noted to be confused, improved on Haldol as needed . With conservative therapy. Her kidney function improved, creatinine improved to 2.42, estimated GFR of 14 from 3.81, estimated GFR of 9 on the day of admission. Patient was seen by nephrologist while in the hospital, recommended to consider Epogen as outpatient. He also recommended to discontinue colchicine, which was given for gout in the past due to renal failure. Patient's diarrhea has improved and her oral intake also improved, she is less somnolent and less confused today. Potassium level was found to be high, Kayexalate was administered. The potassium is to be rechecked later today. IV fluids administered as patient's physical exam and the clinical status changed. Discharged to skilled nursing facility with palliative care is planned for tomorrow, as long as BMP is stable with improved potassium and kidney function. Patient denies any discomfort, states that her diarrhea has resolved  Review of Systems  Unable to perform ROS: Dementia  Constitutional: Negative for chills, fever and weight loss.  HENT: Negative for congestion.   Eyes: Negative for blurred  vision and double vision.  Respiratory: Negative for cough, sputum production, shortness of breath and wheezing.   Cardiovascular: Negative for chest pain, palpitations, orthopnea, leg swelling and PND.  Gastrointestinal: Positive for diarrhea. Negative for abdominal pain, blood in stool, constipation, nausea and vomiting.  Genitourinary: Negative for dysuria, frequency, hematuria and urgency.  Musculoskeletal: Negative for falls.  Neurological: Negative for dizziness, tremors, focal weakness and headaches.  Endo/Heme/Allergies: Does not bruise/bleed easily.  Psychiatric/Behavioral: Negative for depression. The patient does not have insomnia.     VITAL SIGNS: Blood pressure 134/78, pulse 94, temperature 98.3 F (36.8 C), temperature source Oral, resp. rate 20, height 4\' 11"  (1.499 m), weight 52.4 kg (115 lb 8.3 oz), SpO2 94 %.  PHYSICAL EXAMINATION:   GENERAL:  82 y.o.-year-old patient lying in the bed with no acute distress. Dry oral mucosa EYES: Pupils equal, round, reactive to light and accommodation. No scleral icterus. Extraocular muscles intact.  HEENT: Head atraumatic, normocephalic. Oropharynx and nasopharynx clear.  NECK:  Supple, no jugular venous distention. No thyroid enlargement, no tenderness.  LUNGS: Normal breath sounds bilaterally, no wheezing, rales,rhonchi or crepitation. No use of accessory muscles of respiration.  CARDIOVASCULAR: S1, S2 normal. No murmurs, rubs, or gallops.  ABDOMEN: Soft, nontender, nondistended. Bowel sounds present. No organomegaly or mass.  EXTREMITIES: No pedal edema, cyanosis, or clubbing.  NEUROLOGIC: Cranial nerves II through XII are intact. Muscle strength 5/5 in all extremities. Sensation intact. Gait not checked.  PSYCHIATRIC: The patient is alert , disoriented  SKIN: No obvious rash, lesion, or ulcer.   ORDERS/RESULTS REVIEWED:   CBC  Recent Labs Lab 05/01/16 1446 05/01/16 1807 05/02/16 0559 05/03/16 0628  WBC 6.5 6.1 5.5 5.8  HGB 9.3* 9.3* 8.3* 8.9*  HCT 28.8* 29.2* 25.3* 28.2*  PLT 150 143* 134* 135*  MCV 91.4 92.0 90.4 93.8  MCH 29.4 29.3 29.7 29.5  MCHC 32.1 31.9* 32.8 31.4*  RDW 17.0* 17.2* 17.4* 17.9*  LYMPHSABS 0.8*  --   --   --   MONOABS 0.6  --   --   --   EOSABS 0.1  --   --   --   BASOSABS 0.1  --   --   --    ------------------------------------------------------------------------------------------------------------------  Chemistries   Recent Labs Lab 05/02/16 0559  05/03/16 0628 05/04/16 0625 05/05/16 0429 05/05/16 1218 05/05/16 1903 05/06/16 0828 05/07/16 0924  NA 142  --  145 144 143  --   --  143 142  K 3.0*  < > 3.8 4.2 5.2* 5.2* 5.6* 5.6* 5.8*  CL 119*  --  123* 119* 120*  --   --  117* 115*  CO2 18*  --  17* 21* 22  --   --  22 22  GLUCOSE 90  --  104* 128* 110*  --   --  87 104*  BUN 51*  --  43* 39* 40*  --   --  42* 44*  CREATININE 3.32*  --  2.76* 2.42* 2.25*  --   --  2.28* 2.27*  CALCIUM 7.8*  --  8.2* 8.2* 8.2*  --   --  8.6* 8.9  MG 1.7  --  1.7  --   --   --   --   --   --   < > = values in this interval not displayed. ------------------------------------------------------------------------------------------------------------------ estimated creatinine clearance is 12 mL/min (by C-G formula based on SCr of 2.27 mg/dL (H)). ------------------------------------------------------------------------------------------------------------------ No results for input(s): TSH, T4TOTAL, T3FREE, THYROIDAB in the last 72 hours.  Invalid input(s): FREET3  Cardiac Enzymes No results for input(s): CKMB, TROPONINI, MYOGLOBIN in the last 168 hours.  Invalid input(s): CK ------------------------------------------------------------------------------------------------------------------ Invalid input(s): POCBNP ---------------------------------------------------------------------------------------------------------------  RADIOLOGY: No results found.  EKG:  Orders placed or  performed during the hospital encounter of 05/01/16  . EKG 12-Lead  . EKG 12-Lead    ASSESSMENT AND PLAN:  Principal Problem:   Acute on chronic kidney failure (HCC) Active Problems:   Pyuria   C. difficile diarrhea   Dehydration   Hypokalemia   Metabolic acidosis   Ventricular tachycardia, non-sustained (HCC)   SVT (supraventricular tachycardia) (HCC)   Terminal care   Goals of care, counseling/discussion   Palliative care by specialist   Advance care planning #1. Acute on chronic renal failure,  Improved with IV fluid administration from a GFR of 9 to  GFR 18, baseline GFR of 18, per nephrologist, But since patient did not have good oral intake, patient's family decided on hospice home placement, diarrhea has resolved with vancomycin orally and patient's oral intake has improved. Resume IV fluids of f until tomorrow, watching her oral intake closely. Renal ultrasound revealed renal atrophy, no hydronephrosis, bladder debris. Likely discharged to skilled nursing facility with palliative care tomorrow #2, pyuria, questionable urinary tract infection,. Urine cultures are not reported, hold off antibiotic therapy due to C. difficile infection, patient is afebrile, white blood cell count is normal, unlikely infection #3 C. difficile diarrhea, patient is to continue vancomycin orally, diarrhea has subsided, oral intake has improved, likely discharged to skilled nursing facility tomorrow with palliative care #4. Hyperkalemia, continue IV fluids and kayexalate,  follow potassium level later today and tomorrow morning #5.  V. tach episode,  various arrhythmias, continue metoprolol #6 failure to thrive adult, patient'oral intake has improved, follow oral intake as outpatient, patient will be discharged with palliative care follow-up      Management plans discussed with the patient, family and they are in agreement.   DRUG ALLERGIES: No Known Allergies  CODE STATUS:     Code Status Orders         Start     Ordered   05/01/16 1618  Full code  Continuous     05/01/16 1617    Code Status History    Date Active Date Inactive Code Status Order ID Comments User Context   04/17/2016  9:45 PM 04/21/2016  7:28 PM Full Code [id number]  Enid Baas, MD Inpatient   03/31/2016 11:07 PM 04/03/2016  7:00 PM Full Code [id number]  Oralia Manis, MD ED      TOTAL TIME TAKING CARE OF THIS PATIENT: 35  minutes.    discussed with nephrologist  Jackilyn Umphlett M.D on 05/07/2016 at 3:14 PM  Between 7am to 6pm - Pager - [phone]  After 6pm go to [email or web address] - password [email or web address] [facility]  [place] [facility]  Office  [phone]  CC: Primary care physician; Mickey Farber, MD

## 2016-05-07 NOTE — Progress Notes (Signed)
Daily Progress Note   Patient Name: Catherine Anderson       Date: 05/07/2016 DOB: December 19, 1922  Age: 82 y.o. MRN#: [id number] Attending Physician: Katharina Caperima Vaickute, MD Primary Care Physician: Mickey FarberHIES, DAVID, MD Admit Date: 05/01/2016  Reason for Consultation/Follow-up: Establishing goals of care  Subjective: Patient awoke overnight. Eating and drinking this morning. Alert, oriented x 3. States she feels well and desires to go home. Discussed kidney disease and GOC. She desires DNR, no HD. States she has lived a good life. Spoke with family and informed of patient improvement. They would like to continue current level of care and hopeful for her to stabilize for discharge home. Agree to palliative follow up at home.  Review of Systems  All other systems reviewed and are negative.   Length of Stay: 6  Current Medications: Scheduled Meds:  . ENSURE PLUS  120 mL Oral TID BM  . mirtazapine  30 mg Oral QHS  . patiromer  8.4 g Oral Daily  . sodium chloride flush  3 mL Intravenous Q12H    Continuous Infusions: . sodium chloride 75 mL/hr at 05/07/16 1032    PRN Meds: acetaminophen **OR** acetaminophen, antiseptic oral rinse, glycopyrrolate **OR** glycopyrrolate **OR** glycopyrrolate, haloperidol, haloperidol lactate, ipratropium-albuterol, morphine CONCENTRATE **OR** morphine CONCENTRATE, ondansetron **OR** ondansetron (ZOFRAN) IV, oxyCODONE, polyvinyl alcohol, traZODone  Physical Exam  Constitutional: She is oriented to person, place, and time.  Frail, elderly  Cardiovascular: Normal rate and regular rhythm.   Pulmonary/Chest: Effort normal and breath sounds normal.  Musculoskeletal:  Generalized weakness  Neurological: She is alert and oriented to person, place, and time.  Skin: Skin is warm  and dry. There is pallor.            Vital Signs: BP 134/78 (BP Location: Left Arm)   Pulse 94   Temp 98.3 F (36.8 C) (Oral)   Resp 20   Ht 4\' 11"  (1.499 m)   Wt 52.4 kg (115 lb 8.3 oz)   SpO2 94%   BMI 23.33 kg/m  SpO2: SpO2: 94 % O2 Device: O2 Device: Not Delivered O2 Flow Rate: O2 Flow Rate (L/min): 2 L/min  Intake/output summary:  Intake/Output Summary (Last 24 hours) at 05/07/16 1330 Last data filed at 05/07/16 0729  Gross per 24 hour  Intake  0 ml  Output              475 ml  Net             -475 ml   LBM: Last BM Date: 05/06/16 Baseline Weight: Weight: 52.4 kg (115 lb 8.3 oz) Most recent weight: Weight: 52.4 kg (115 lb 8.3 oz)       Palliative Assessment/Data: PPS: 20%    Flowsheet Rows   Flowsheet Row Most Recent Value  Intake Tab  Referral Department  Hospitalist  Unit at Time of Referral  Med/Surg Unit  Palliative Care Primary Diagnosis  Cardiac  Date Notified  05/03/16  Palliative Care Type  New Palliative care  Reason for referral  Clarify Goals of Care  Date of Admission  05/01/16  # of days IP prior to Palliative referral  2  Clinical Assessment  Palliative Performance Scale Score  10%  Psychosocial & Spiritual Assessment  Social Work Plan of Care  Education on Hospice, Advance care planning, Grief and Bereavement support, Participated in Family meeting  Palliative Care Outcomes  Patient/Family meeting held?  Yes  Palliative Care Outcomes  Changed to focus on comfort, Changed CPR status, Provided end of life care assistance, Transitioned to hospice  Patient/Family wishes: Interventions discontinued/not started   Mechanical Ventilation, Trach, PEG, BiPAP, Hemodialysis, Transfusion, Transfer out of ICU, Antibiotics, Tube feedings/TPN, NIPPV, Vasopressors  Palliative Care follow-up planned  Yes, Facility      Patient Active Problem List   Diagnosis Date Noted  . Terminal care   . Goals of care, counseling/discussion   . Palliative  care by specialist   . Advance care planning   . Pyuria 05/04/2016  . C. difficile diarrhea 05/04/2016  . Dehydration 05/04/2016  . Hypokalemia 05/04/2016  . Metabolic acidosis 05/04/2016  . Ventricular tachycardia, non-sustained (HCC) 05/04/2016  . SVT (supraventricular tachycardia) (HCC) 05/04/2016  . Acute on chronic kidney failure (HCC) 05/01/2016  . HTN (hypertension) 03/31/2016  . HLD (hyperlipidemia) 03/31/2016  . CKD (chronic kidney disease), stage IV (HCC) 03/31/2016  . Hypothyroidism 03/31/2016    Palliative Care Assessment & Plan   Patient Profile: 82 y.o. female  with past medical history of chronic kidney disease, C. Diff, HTN, gout, GI bleed admitted on 05/01/2016 with dehydration and acute on chronic kidney failure. GFR has been as low as 9, today is up to 19 as result of IV fluid rehydration. Patient has been confused during admission, not taking in oral nutrition or hydration. She has received treatment with oral vanc for C.Diff but has continued to have diarrhea despite treatment. Remains hyperkalemic, but has improvement in GFR and mental status stabilized.   Assessment/Recommendations/Plan   DNR  Community palliative f/u at discharge  No hemodialysis  My concern for this patient is that she will decline quickly once IV fluids are stopped. Discussed with attending physician and plan is to continue IV fluids in order to stabilize K+ then discharge to SNF with palliative f/u  Cancel residential hospice referral   Goals of Care and Additional Recommendations:  Limitations on Scope of Treatment: No Hemodialysis  Code Status:  DNR  Prognosis:   < 6 months  Discharge Planning:  Skilled Nursing Facility for rehab with Palliative care service follow-up  Care plan was discussed with patient, attending physician, hospice RN, and patient's family  Thank you for allowing the Palliative Medicine Team to assist in the care of this patient.   Time In: 1300  Time Out: 1340 Total Time  40 mins Prolonged Time Billed No      Greater than 50%  of this time was spent counseling and coordinating care related to the above assessment and plan.  Ocie Bob, AGNP-C Palliative Medicine   Please contact Palliative Medicine Team phone at [phone] for questions and concerns.

## 2016-05-07 NOTE — Progress Notes (Signed)
[facility]  ROUNDING NOTE   Subjective:   Woke up this morning.  Veltassa not given yesterday Pending labs today.   Objective:  Vital signs in last 24 hours:  Temp:  [98.3 F (36.8 C)] 98.3 F (36.8 C) (11/15 2240) Pulse Rate:  [94] 94 (11/15 2240) Resp:  [20] 20 (11/15 2240) BP: (134)/(78) 134/78 (11/15 2240) SpO2:  [94 %] 94 % (11/15 2240)  Weight change:  Filed Weights   05/01/16 1441  Weight: 52.4 kg (115 lb 8.3 oz)    Intake/Output: I/O last 3 completed shifts: In: 0  Out: 950 [Urine:950]   Intake/Output this shift:  Total I/O In: -  Out: 225 [Urine:225]  Physical Exam: General: No acute distress  Head: Normocephalic, atraumatic. Moist oral mucosal membranes  Eyes: Anicteric  Neck: Supple, trachea midline  Lungs:  Clear to auscultation, normal effort  Heart: S1S2 no rubs  Abdomen:  Soft, nontender, bowel sounds present  Extremities: trace peripheral edema.  Neurologic: Awake, follows commands, hard of hearing, oriented to self only.   Skin: No lesions       Basic Metabolic Panel:  Recent Labs Lab 05/02/16 0559  05/03/16 0628 05/04/16 0625 05/05/16 0429 05/05/16 1218 05/05/16 1903 05/06/16 0828  NA 142  --  145 144 143  --   --  143  K 3.0*  < > 3.8 4.2 5.2* 5.2* 5.6* 5.6*  CL 119*  --  123* 119* 120*  --   --  117*  CO2 18*  --  17* 21* 22  --   --  22  GLUCOSE 90  --  104* 128* 110*  --   --  87  BUN 51*  --  43* 39* 40*  --   --  42*  CREATININE 3.32*  --  2.76* 2.42* 2.25*  --   --  2.28*  CALCIUM 7.8*  --  8.2* 8.2* 8.2*  --   --  8.6*  MG 1.7  --  1.7  --   --   --   --   --   PHOS  --   --   --   --   --   --   --  3.5  < > = values in this interval not displayed.  Liver Function Tests:  Recent Labs Lab 05/05/16 1903 05/06/16 0828  ALBUMIN 2.5* 2.5*   No results for input(s): LIPASE, AMYLASE in the last 168 hours. No results for input(s): AMMONIA in the last 168 hours.  CBC:  Recent Labs Lab 05/01/16 1446  05/01/16 1807 05/02/16 0559 05/03/16 0628  WBC 6.5 6.1 5.5 5.8  NEUTROABS 4.9  --   --   --   HGB 9.3* 9.3* 8.3* 8.9*  HCT 28.8* 29.2* 25.3* 28.2*  MCV 91.4 92.0 90.4 93.8  PLT 150 143* 134* 135*    Cardiac Enzymes: No results for input(s): CKTOTAL, CKMB, CKMBINDEX, TROPONINI in the last 168 hours.  BNP: Invalid input(s): POCBNP  CBG:  Recent Labs Lab 05/05/16 1025 05/05/16 1101 05/05/16 1510  GLUCAP 84 111* 92    Microbiology: Results for orders placed or performed during the hospital encounter of 05/01/16  MRSA PCR Screening     Status: None   Collection Time: 05/01/16  6:48 PM  Result Value Ref Range Status   MRSA by PCR NEGATIVE NEGATIVE Final    Comment:        The GeneXpert MRSA Assay (FDA approved for NASAL specimens only), is one  component of a comprehensive MRSA colonization surveillance program. It is not intended to diagnose MRSA infection nor to guide or monitor treatment for MRSA infections.     Coagulation Studies: No results for input(s): LABPROT, INR in the last 72 hours.  Urinalysis: No results for input(s): COLORURINE, LABSPEC, PHURINE, GLUCOSEU, HGBUR, BILIRUBINUR, KETONESUR, PROTEINUR, UROBILINOGEN, NITRITE, LEUKOCYTESUR in the last 72 hours.  Invalid input(s): APPERANCEUR    Imaging: No results found.   Medications:   . sodium chloride 75 mL/hr at 05/07/16 1032   . ENSURE PLUS  120 mL Oral TID BM  . mirtazapine  30 mg Oral QHS  . patiromer  8.4 g Oral Daily  . sodium chloride flush  3 mL Intravenous Q12H   acetaminophen **OR** acetaminophen, antiseptic oral rinse, glycopyrrolate **OR** glycopyrrolate **OR** glycopyrrolate, haloperidol, haloperidol lactate, ipratropium-albuterol, morphine CONCENTRATE **OR** morphine CONCENTRATE, ondansetron **OR** ondansetron (ZOFRAN) IV, oxyCODONE, polyvinyl alcohol, traZODone  Assessment/ Plan:  82 y.o. white female with hypertension, hyperlipidemia, hypothyroidism, osteoporosis, vitamin  B12 deficiency, diet controlled diabetes mellitus type 2, hyperuricemia, peptic ulcer disease, excision of parotid gland tumor  1. Acute renal failure with metabolic acidosis and hyperkalemia on chronic kidney disease stage IV: with proteinuria and hematuria. baseline creatinine 2.2 with EGFR of 19 03/12/16 Acute renal failure secondary to prerenal azotemia from GI losses Chronic Kidney Disease secondary to hypertension. Patient does not want dialysis - restarted IV fluids - veltassa for hyperkalemia  2. Anemia of chronic kidney disease.   - not on outpatient epo  3. Hypertension: blood pressure at goal.  - diltiazem    LOS: 6 Iver Miklas 11/16/201710:38 AM

## 2016-05-07 NOTE — Care Management (Signed)
RNCM consulted for palliative services.  CSW aware and facilitating discharge as patient is from a facility.  RNCM signing off

## 2016-05-07 NOTE — Progress Notes (Signed)
Per chart note review and discussion with Palliative NP Ocie BobKasie Mahan, patient has shown some improvement and family is hopeful she will be able to [facility] and are open to Palliative services there. Thank you.  Christie BeckersKaren Robetson RN, BSN, [facility] [place], [facility] [phone]

## 2016-05-07 NOTE — Progress Notes (Signed)
Become more alert at beginning of shift; stated that she was hungry and ate 2 applesauces; slept well overnight; Windy Carinaurner,Gamal Todisco K, RN 7:04 AM11/16/2017

## 2016-05-07 NOTE — Progress Notes (Signed)
Physical Therapy Treatment Patient Details Name: Catherine Anderson MRN: [id number] DOB: 03-03-1923 Today's Date: 05/07/2016    History of Present Illness Catherine Anderson  is a 82 y.o. female with a known history of Chronic kidney disease stage IV recent C. difficile infection currently on Flagyl who is [facility] with diarrhea found to have abnormal blood work revealing worsening kidney function.     PT Comments    Per chart review, pt much more alert today, eating and communicating. Pt denies pain and is agreeable to PT. Pt performs supine exercises quite well with little to no assist. Pt communicative during session and notes she wants to do whatever she or doctors can do to live. Pt had not yet eaten lunch; offered pt up in chair to eat. Pt agreeable and notes she is hungry. Mod A for bed mobility, as well as sit to stand transfers form elevated surface. Pt has poor sitting posture, but can correct on cues. Stand posture poor as well and pt has more difficulty correcting. Pt notes she is weak from being in the bed. Pt is able to take several small purposeful steps to chair, which was placed adjacent to the bed. Pt up in chair comfortably with family member in the room to assist pt with lunch. Continue PT to progress strength, endurance, posture and balance to improve all functional mobility.   Follow Up Recommendations  SNF     Equipment Recommendations  None recommended by PT    Recommendations for Other Services       Precautions / Restrictions Precautions Precautions: Fall Restrictions Weight Bearing Restrictions: No    Mobility  Bed Mobility Overal bed mobility: Needs Assistance Bed Mobility: Supine to Sit     Supine to sit: Mod assist     General bed mobility comments: Assist to scoot hips in supine to edge of bed as well as elevating trunk. Less assist to move legs to edge of bed. Good effort  Transfers Overall transfer level: Needs  assistance Equipment used: Rolling walker (2 wheeled) Transfers: Sit to/from Stand Sit to Stand: Mod assist;From elevated surface         General transfer comment: 3 attempts before achieving stand.   Ambulation/Gait Ambulation/Gait assistance: Min assist Ambulation Distance (Feet): 2 Feet Assistive device: Rolling walker (2 wheeled) Gait Pattern/deviations: Step-to pattern;Trunk flexed Gait velocity: decreased Gait velocity interpretation: <1.8 ft/sec, indicative of risk for recurrent falls General Gait Details: Severe forward flexed trunk; Min A for balance. Is able to clear feet for small purposeful steps   Stairs            Wheelchair Mobility    Modified Rankin (Stroke Patients Only)       Balance Overall balance assessment: Needs assistance Sitting-balance support: Feet supported;Bilateral upper extremity supported Sitting balance-Leahy Scale: Fair Sitting balance - Comments: leans forward and left Postural control: Left lateral lean;Other (comment) (forward) Standing balance support: Bilateral upper extremity supported Standing balance-Leahy Scale: Poor Standing balance comment: Requires Min A to maintain balance with rw                     Cognition Arousal/Alertness: Awake/alert Behavior During Therapy: WFL for tasks assessed/performed Overall Cognitive Status: History of cognitive impairments - at baseline                      Exercises General Exercises - Lower Extremity Ankle Circles/Pumps: AROM;Both;10 reps;Supine Quad Sets: Strengthening;Both;10 reps;Supine Gluteal Sets: Strengthening;Both;10  reps;Supine;Other (comment) (with QS; less contraction in glutes) Short Arc Quad: AROM;Both;10 reps;Supine Heel Slides: AROM;Both;10 reps;Supine Hip ABduction/ADduction: AROM;Both;10 reps;Supine;Other (comment) (Min A only to hold leg up)    General Comments        Pertinent Vitals/Pain Pain Assessment: No/denies pain    Home Living                       Prior Function            PT Goals (current goals can now be found in the care plan section) Progress towards PT goals: Progressing toward goals    Frequency    Min 2X/week      PT Plan Current plan remains appropriate    Co-evaluation             End of Session Equipment Utilized During Treatment: Gait belt Activity Tolerance: Patient limited by fatigue Patient left: in chair;with call bell/phone within reach;with chair alarm set;with family/visitor present     Time: [id number] PT Time Calculation (min) (ACUTE ONLY): 30 min  Charges:  $Therapeutic Exercise: 8-22 mins $Therapeutic Activity: 8-22 mins                    G Codes[place]ot [place]:      Charelle Petrakis E Milaina Sher, PTA 05/07/2016, 3:56 PM

## 2016-05-08 DIAGNOSIS — N390 Urinary tract infection, site not specified: Secondary | ICD-10-CM | POA: Diagnosis not present

## 2016-05-08 DIAGNOSIS — I083 Combined rheumatic disorders of mitral, aortic and tricuspid valves: Secondary | ICD-10-CM | POA: Diagnosis not present

## 2016-05-08 DIAGNOSIS — Z7401 Bed confinement status: Secondary | ICD-10-CM | POA: Diagnosis not present

## 2016-05-08 DIAGNOSIS — R809 Proteinuria, unspecified: Secondary | ICD-10-CM | POA: Diagnosis not present

## 2016-05-08 DIAGNOSIS — R5381 Other malaise: Secondary | ICD-10-CM | POA: Diagnosis not present

## 2016-05-08 DIAGNOSIS — R7989 Other specified abnormal findings of blood chemistry: Secondary | ICD-10-CM | POA: Diagnosis not present

## 2016-05-08 DIAGNOSIS — I480 Paroxysmal atrial fibrillation: Secondary | ICD-10-CM | POA: Diagnosis not present

## 2016-05-08 DIAGNOSIS — I129 Hypertensive chronic kidney disease with stage 1 through stage 4 chronic kidney disease, or unspecified chronic kidney disease: Secondary | ICD-10-CM | POA: Diagnosis not present

## 2016-05-08 DIAGNOSIS — E875 Hyperkalemia: Secondary | ICD-10-CM | POA: Diagnosis not present

## 2016-05-08 DIAGNOSIS — M6281 Muscle weakness (generalized): Secondary | ICD-10-CM | POA: Diagnosis not present

## 2016-05-08 DIAGNOSIS — D631 Anemia in chronic kidney disease: Secondary | ICD-10-CM | POA: Diagnosis not present

## 2016-05-08 DIAGNOSIS — N179 Acute kidney failure, unspecified: Secondary | ICD-10-CM | POA: Diagnosis not present

## 2016-05-08 DIAGNOSIS — E872 Acidosis: Secondary | ICD-10-CM | POA: Diagnosis not present

## 2016-05-08 DIAGNOSIS — A0472 Enterocolitis due to Clostridium difficile, not specified as recurrent: Secondary | ICD-10-CM | POA: Diagnosis not present

## 2016-05-08 DIAGNOSIS — N184 Chronic kidney disease, stage 4 (severe): Secondary | ICD-10-CM | POA: Diagnosis not present

## 2016-05-08 DIAGNOSIS — N186 End stage renal disease: Secondary | ICD-10-CM | POA: Diagnosis not present

## 2016-05-08 LAB — RENAL FUNCTION PANEL
ANION GAP: 4 — AB (ref 5–15)
Albumin: 2.6 g/dL — ABNORMAL LOW (ref 3.5–5.0)
BUN: 43 mg/dL — ABNORMAL HIGH (ref 6–20)
CALCIUM: 8.5 mg/dL — AB (ref 8.9–10.3)
CO2: 23 mmol/L (ref 22–32)
Chloride: 115 mmol/L — ABNORMAL HIGH (ref 101–111)
Creatinine, Ser: 2.22 mg/dL — ABNORMAL HIGH (ref 0.44–1.00)
GFR, EST AFRICAN AMERICAN: 21 mL/min — AB (ref >60)
GFR, EST NON AFRICAN AMERICAN: 18 mL/min — AB (ref >60)
Glucose, Bld: 110 mg/dL — ABNORMAL HIGH (ref 65–99)
Phosphorus: 4.1 mg/dL (ref 2.5–4.6)
Potassium: 5.3 mmol/L — ABNORMAL HIGH (ref 3.5–5.1)
SODIUM: 142 mmol/L (ref 135–145)

## 2016-05-08 MED ORDER — VANCOMYCIN 50 MG/ML ORAL SOLUTION: 125.0000 mg | Freq: Four times a day (QID) | ORAL | 0 refills | Status: AC

## 2016-05-08 MED ORDER — BIOTENE DRY MOUTH MT LIQD: 15.0000 mL | OROMUCOSAL | 5 refills | Status: AC | PRN

## 2016-05-08 MED ORDER — HALOPERIDOL 1 MG PO TABS: 1.0000 mg | ORAL_TABLET | Freq: Two times a day (BID) | ORAL | 3 refills | Status: AC | PRN

## 2016-05-08 MED ORDER — POLYVINYL ALCOHOL 1.4 % OP SOLN: 1.0000 [drp] | Freq: Four times a day (QID) | OPHTHALMIC | 0 refills | Status: AC | PRN

## 2016-05-08 NOTE — Clinical Social Work Note (Signed)
CSW contacted Ms. Mahan, patient's sister in law, this morning via phone. Physician is going to [facility] with [facility]iative Care following. CSW updated Ms. Mahan regarding this. CSW also left 2 messages for [facility] to attempt to notify that patient would be returning today and would need Palliative Care following. CSW has sent discharge information.  York SpanielMonica Talar Fraley MSW,LCSW [phone]

## 2016-05-08 NOTE — Progress Notes (Signed)
[facility]  ROUNDING NOTE   Subjective:   IV fluids and foley removed. Plan on discharge today.   Objective:  Vital signs in last 24 hours:  Temp:  [98.1 F (36.7 C)-98.4 F (36.9 C)] 98.2 F (36.8 C) (11/17 1516) Pulse Rate:  [87-95] 87 (11/17 1516) Resp:  [15-24] 15 (11/17 1516) BP: (131-143)/(73-87) 134/87 (11/17 1516) SpO2:  [92 %-95 %] 92 % (11/17 0812)  Weight change:  Filed Weights   05/01/16 1441  Weight: 52.4 kg (115 lb 8.3 oz)    Intake/Output: I/O last 3 completed shifts: In: 4944 [I.V.:1457] Out: 9675 [Urine:1275]   Intake/Output this shift:  No intake/output data recorded.  Physical Exam: General: No acute distress  Head: Normocephalic, atraumatic. Moist oral mucosal membranes  Eyes: Anicteric  Neck: Supple, trachea midline  Lungs:  Clear to auscultation, normal effort  Heart: S1S2 no rubs  Abdomen:  Soft, nontender, bowel sounds present  Extremities: trace peripheral edema.  Neurologic: Awake, follows commands, hard of hearing, oriented to self only.   Skin: No lesions       Basic Metabolic Panel:  Recent Labs Lab 05/02/16 0559  05/03/16 0628 05/04/16 0625 05/05/16 0429  05/05/16 1903 05/06/16 0828 05/07/16 0924 05/07/16 1552 05/08/16 0543  NA 142  --  145 144 143  --   --  143 142  --  142  K 3.0*  < > 3.8 4.2 5.2*  < > 5.6* 5.6* 5.8* 4.8 5.3*  CL 119*  --  123* 119* 120*  --   --  117* 115*  --  115*  CO2 18*  --  17* 21* 22  --   --  22 22  --  23  GLUCOSE 90  --  104* 128* 110*  --   --  87 104*  --  110*  BUN 51*  --  43* 39* 40*  --   --  42* 44*  --  43*  CREATININE 3.32*  --  2.76* 2.42* 2.25*  --   --  2.28* 2.27*  --  2.22*  CALCIUM 7.8*  --  8.2* 8.2* 8.2*  --   --  8.6* 8.9  --  8.5*  MG 1.7  --  1.7  --   --   --   --   --   --   --   --   PHOS  --   --   --   --   --   --   --  3.5  --   --  4.1  < > = values in this interval not displayed.  Liver Function Tests:  Recent Labs Lab 05/05/16 1903  05/06/16 0828 05/08/16 0543  ALBUMIN 2.5* 2.5* 2.6*   No results for input(s): LIPASE, AMYLASE in the last 168 hours. No results for input(s): AMMONIA in the last 168 hours.  CBC:  Recent Labs Lab 05/01/16 1807 05/02/16 0559 05/03/16 0628  WBC 6.1 5.5 5.8  HGB 9.3* 8.3* 8.9*  HCT 29.2* 25.3* 28.2*  MCV 92.0 90.4 93.8  PLT 143* 134* 135*    Cardiac Enzymes: No results for input(s): CKTOTAL, CKMB, CKMBINDEX, TROPONINI in the last 168 hours.  BNP: Invalid input(s): POCBNP  CBG:  Recent Labs Lab 05/05/16 1025 05/05/16 1101 05/05/16 1510  GLUCAP 84 111* 37    Microbiology: Results for orders placed or performed during the hospital encounter of 05/01/16  MRSA PCR Screening     Status: None   Collection  Time: 05/01/16  6:48 PM  Result Value Ref Range Status   MRSA by PCR NEGATIVE NEGATIVE Final    Comment:        The GeneXpert MRSA Assay (FDA approved for NASAL specimens only), is one component of a comprehensive MRSA colonization surveillance program. It is not intended to diagnose MRSA infection nor to guide or monitor treatment for MRSA infections.     Coagulation Studies: No results for input(s): LABPROT, INR in the last 72 hours.  Urinalysis: No results for input(s): COLORURINE, LABSPEC, PHURINE, GLUCOSEU, HGBUR, BILIRUBINUR, KETONESUR, PROTEINUR, UROBILINOGEN, NITRITE, LEUKOCYTESUR in the last 72 hours.  Invalid input(s): APPERANCEUR    Imaging: No results found.   Medications:    . ENSURE PLUS  120 mL Oral TID BM  . mirtazapine  30 mg Oral QHS  . patiromer  8.4 g Oral Daily  . sodium chloride flush  3 mL Intravenous Q12H   acetaminophen **OR** acetaminophen, antiseptic oral rinse, glycopyrrolate **OR** glycopyrrolate **OR** glycopyrrolate, haloperidol, haloperidol lactate, ipratropium-albuterol, ondansetron **OR** ondansetron (ZOFRAN) IV, oxyCODONE, polyvinyl alcohol, traZODone  Assessment/ Plan:  82 y.o. white female with hypertension,  hyperlipidemia, hypothyroidism, osteoporosis, vitamin B12 deficiency, diet controlled diabetes mellitus type 2, hyperuricemia, peptic ulcer disease, excision of parotid gland tumor  1. Acute renal failure with metabolic acidosis and hyperkalemia on chronic kidney disease stage IV: with proteinuria and hematuria. baseline creatinine 2.2 with EGFR of 19 03/12/16 Acute renal failure secondary to prerenal azotemia from GI losses Chronic Kidney Disease secondary to hypertension. Patient does not want dialysis - veltassa for hyperkalemia  2. Anemia of chronic kidney disease.   - not on outpatient epo  3. Hypertension: blood pressure at goal.  - diltiazem  Follow up with me, Dr. Juleen China at 12/1 at 2:20pm   LOS: [place], [place] 11/17/20173:46 PM

## 2016-05-08 NOTE — Progress Notes (Addendum)
Pt to be discharged per MD order. Foley removed earlier and pt has since voided. Iv removed. Report called to nursing staff at [facility]. Transfer packet prepared by Maxine GlennMonica, CSW. Awaiting EMS for transfer

## 2016-05-08 NOTE — Discharge Summary (Signed)
[facility] - [place] [facility]   PATIENT NAME: Catherine Anderson    MR#:  [id number]  DATE OF BIRTH:  01-12-1923  DATE OF ADMISSION:  05/01/2016 ADMITTING PHYSICIAN: Wyatt Haste, MD  DATE OF DISCHARGE: No discharge date for patient encounter.  PRIMARY CARE PHYSICIAN: THIES, DAVID, MD     ADMISSION DIAGNOSIS:  Acute renal failure, unspecified acute renal failure type (HCC) [N17.9]  DISCHARGE DIAGNOSIS:  Principal Problem:   Acute on chronic kidney failure (HCC) Active Problems:   Pyuria   C. difficile diarrhea   Dehydration   Hypokalemia   Metabolic acidosis   Ventricular tachycardia, non-sustained (HCC)   SVT (supraventricular tachycardia) (HCC)   SECONDARY DIAGNOSIS:   Past Medical History:  Diagnosis Date  . CKD (chronic kidney disease), stage IV (HCC)   . Gout   . History of GI bleed   . HLD (hyperlipidemia)   . Hypertension   . Thyroid disease     .pro HOSPITAL COURSE:  The patient is a 82 year old Caucasian female with past medical history significant for history of CAD, recent C. difficile infection, who presented with dehydration, worsening renal function. The patient was admitted to the hospital and initiated on vancomycin orally, she was rehydrated with IV fluids. Diarrhea subsided.  While in the hospital patient was noted to be acidotic, hypokalemic, relatively hypomagnesemia, she had a few episodes of V. tach and SVT, which resolved with improvement of her condition, resolution of electrolyte abnormalities. Patient was also noted to be confused, improved on Haldol as needed . With conservative therapy. Her kidney function improved, creatinine improved to 2.22, estimated GFR of 18 from 3.81, estimated GFR of 9 on the day of admission. Patient was seen by nephrologist while in the hospital, recommended to consider Epogen as outpatient. He also recommended to discontinue colchicine, which was given for gout in the past due to renal  failure. The patient was seen by physical therapist and recommended skilled nursing facility placement. While in the hospital patient had a poor oral intake and was not able to keep up with oral requirements, making her at high risk for dehydration and recurrent hospitalizations. Patient was seen by palliative care, patient's family decided on comfort care measures, discharged with hospice, however, patient condition stabilized as time went on. Her oral intake, however, remains poor, low, at around 25%, necessitating palliative care follow up as outpatient due to likely unfavorable outcome.  Discussion by problem: #1. Acute on chronic renal failure,  improved with IV fluid administration from a GFR of 9 to  GFR 18 on the day of discharge, which is patient's baseline , per nephrologist,  renal ultrasound revealed renal atrophy, no hydronephrosis, but bladder debris. Patient is to follow up with nephrologist within 1-2 days after discharge to follow kidney function and needed to administer additional medications. Patient will be followed by palliative care in the facility. She will be discharged to skilled nursing facility today.  #2, pyuria,  the patient was not on antibiotic therapy due to C. difficile infection, the patient remained asymptomatic, had no white blood cell count elevation or fevers. #3 C. difficile diarrhea with severe dehydration, continue vancomycin orally for 10 more days to complete course, the patient has improved clinically, continue soft diet . It is recommended to advance to regular diet as long as patient is able to tolerate. It is also recommended to follow her oral fluid intake and force fluid intake as much as possible to maintain hydration . Suspending  Lasix.  #4. Hypokalemia, then severe hyperkalemia, with conservative therapy potassium level normalized, magnesium level was marginal, supplemented intravenously  #5. V. tach, nonsustained, SVT, likely due to electrolyte  abnormalities, ectrolytes have improved, patient remains in sinus rhythm at present . Continue Cardizem.   #6. Anemia of chronic disease, of CK D, nephrologist recommends to consider Epogen as outpatient.  #7. History of gout, colchicine is discontinued due to renal failure  #8 failure to thrive adult, she is oral intake has improved somewhat, follow it closely as outpatient, patient will be discharged to skilled nursing facility with palliative care follow-up DISCHARGE CONDITIONS:   Treatment Team:  Wyatt Haste, MD Mady Haagensen, MD  DRUG ALLERGIES:  No Known Allergies  DISCHARGE MEDICATIONS:   Current Discharge Medication List    START taking these medications   Details  vancomycin (VANCOCIN) 50 mg/mL oral solution Take 2.5 mLs (125 mg total) by mouth every 6 (six) hours. Qty: 120 mL, Refills: 0      CONTINUE these medications which have NOT CHANGED   Details  aspirin EC 325 MG tablet Take 1 tablet (325 mg total) by mouth daily. Qty: 30 tablet, Refills: 0    Banana Flakes (BANATROL PLUS) PACK Take 1 packet by mouth 3 (three) times daily.    diltiazem (CARDIZEM CD) 120 MG 24 hr capsule Take 1 capsule (120 mg total) by mouth daily.    ENSURE PLUS (ENSURE PLUS) LIQD Take 120 mLs by mouth 3 (three) times daily between meals.    ferrous sulfate 325 (65 FE) MG tablet Take 325 mg by mouth daily with breakfast.    levothyroxine (SYNTHROID, LEVOTHROID) 125 MCG tablet Take 0.5 tablets (62.5 mcg total) by mouth daily before breakfast.    mirtazapine (REMERON SOL-TAB) 15 MG disintegrating tablet Take 15 mg by mouth at bedtime.    sodium bicarbonate 650 MG tablet Take 1 tablet (650 mg total) by mouth 2 (two) times daily. Qty: 10 tablet, Refills: 0    traMADol (ULTRAM) 50 MG tablet Take 1 tablet (50 mg total) by mouth every 6 (six) hours as needed. Qty: 20 tablet, Refills: 0      STOP taking these medications     colchicine 0.6 MG tablet      hydrALAZINE (APRESOLINE) 25 MG  tablet      metroNIDAZOLE (FLAGYL) 500 MG tablet          . Patient is to follow-up with primary care physician within one week after dischargeIf you experience worsening of your admission symptoms, develop shortness of breath, life threatening emergency, suicidal or homicidal thoughts you must seek medical attention immediately by calling 911 or calling your MD immediately  if symptoms less severe.  You Must read complete instructions/literature along with all the possible adverse reactions/side effects for all the Medicines you take and that have been prescribed to you. Take any new Medicines after you have completely understood and accept all the possible adverse reactions/side effects.   Please note  You were cared for by a hospitalist during your hospital stay. If you have any questions about your discharge medications or the care you received while you were in the hospital after you are discharged, you can call the unit and asked to speak with the hospitalist on call if the hospitalist that took care of you is not available. Once you are discharged, your primary care physician will handle any further medical issues. Please note that NO REFILLS for any discharge medications will be authorized once you  are discharged, as it is imperative that you return to your primary care physician (or establish a relationship with a primary care physician if you do not have one) for your aftercare needs so that they can reassess your need for medications and monitor your lab values.    Today   CHIEF COMPLAINT:   Chief Complaint  Patient presents with  . Abnormal Lab    HISTORY OF PRESENT ILLNESS:  The patient is a 82 year old Caucasian female with past medical history significant for history of CAD, recent C. difficile infection, who presented with dehydration, worsening renal function. The patient was admitted to the hospital and initiated on vancomycin orally, she was rehydrated with IV fluids.  Diarrhea subsided.  While in the hospital patient was noted to be acidotic, hypokalemic, relatively hypomagnesemia, she had a few episodes of V. tach and SVT, which resolved with improvement of her condition, resolution of electrolyte abnormalities. Patient was also noted to be confused, improved on Haldol as needed . With conservative therapy. Her kidney function improved, creatinine improved to 2.22, estimated GFR of 18 from 3.81, estimated GFR of 9 on the day of admission. Patient was seen by nephrologist while in the hospital, recommended to consider Epogen as outpatient. He also recommended to discontinue colchicine, which was given for gout in the past due to renal failure. The patient was seen by physical therapist and recommended skilled nursing facility placement. While in the hospital patient had a poor oral intake and was not able to keep up with oral requirements, making her at high risk for dehydration and recurrent hospitalizations. Patient was seen by palliative care, patient's family decided on comfort care measures, discharged with hospice, however, patient condition stabilized as time went on. Her oral intake, however, remains poor, low, at around 25%, necessitating palliative care follow up as outpatient due to likely unfavorable outcome.  Discussion by problem: #1. Acute on chronic renal failure,  improved with IV fluid administration from a GFR of 9 to  GFR 18 on the day of discharge, which is patient's baseline , per nephrologist,  renal ultrasound revealed renal atrophy, no hydronephrosis, but bladder debris. Patient is to follow up with nephrologist within 1-2 days after discharge to follow kidney function and needed to administer additional medications. Patient will be followed by palliative care in the facility. She will be discharged to skilled nursing facility today.  #2, pyuria,  the patient was not on antibiotic therapy due to C. difficile infection, the patient remained  asymptomatic, had no white blood cell count elevation or fevers. #3 C. difficile diarrhea with severe dehydration, continue vancomycin orally for 10 more days to complete course, the patient has improved clinically, continue soft diet . It is recommended to advance to regular diet as long as patient is able to tolerate. It is also recommended to follow her oral fluid intake and force fluid intake as much as possible to maintain hydration . Suspending Lasix.  #4. Hypokalemia, then severe hyperkalemia, with conservative therapy potassium level normalized, magnesium level was marginal, supplemented intravenously  #5. V. tach, nonsustained, SVT, likely due to electrolyte abnormalities, ectrolytes have improved, patient remains in sinus rhythm at present . Continue Cardizem.   #6. Anemia of chronic disease, of CK D, nephrologist recommends to consider Epogen as outpatient.  #7. History of gout, colchicine is discontinued due to renal failure  #8 failure to thrive adult, she is oral intake has improved somewhat, follow it closely as outpatient, patient will be discharged to skilled nursing  facility with palliative care follow-up    VITAL SIGNS:  Blood pressure 137/79, pulse 93, temperature 98.2 F (36.8 C), temperature source Oral, resp. rate (!) 28, height 4\' 11"  (1.499 m), weight 52.4 kg (115 lb 8.3 oz), SpO2 100 %.  I/O:   Intake/Output Summary (Last 24 hours) at 05/04/16 1128 Last data filed at 05/04/16 1100  Gross per 24 hour  Intake          2257.95 ml  Output              625 ml  Net          1632.95 ml    PHYSICAL EXAMINATION:  GENERAL:  82 y.o.-year-old patient lying in the bed with no acute distress.  EYES: Pupils equal, round, reactive to light and accommodation. No scleral icterus. Extraocular muscles intact.  HEENT: Head atraumatic, normocephalic. Oropharynx and nasopharynx clear.  NECK:  Supple, no jugular venous distention. No thyroid enlargement, no tenderness.  LUNGS: Normal  breath sounds bilaterally, no wheezing, rales,rhonchi or crepitation. No use of accessory muscles of respiration.  CARDIOVASCULAR: S1, S2 normal. No murmurs, rubs, or gallops.  ABDOMEN: Soft, non-tender, non-distended. Bowel sounds present. No organomegaly or mass.  EXTREMITIES: No pedal edema, cyanosis, or clubbing.  NEUROLOGIC: Cranial nerves II through XII are intact. Muscle strength 5/5 in all extremities. Sensation intact. Gait not checked.  PSYCHIATRIC: The patient is alert and oriented x 3.  SKIN: No obvious rash, lesion, or ulcer.   DATA REVIEW:   CBC  Recent Labs Lab 05/03/16 0628  WBC 5.8  HGB 8.9*  HCT 28.2*  PLT 135*    Chemistries   Recent Labs Lab 05/03/16 0628 05/04/16 0625  NA 145 144  K 3.8 4.2  CL 123* 119*  CO2 17* 21*  GLUCOSE 104* 128*  BUN 43* 39*  CREATININE 2.76* 2.42*  CALCIUM 8.2* 8.2*  MG 1.7  --     Cardiac Enzymes No results for input(s): TROPONINI in the last 168 hours.  Microbiology Results  Results for orders placed or performed during the hospital encounter of 05/01/16  MRSA PCR Screening     Status: None   Collection Time: 05/01/16  6:48 PM  Result Value Ref Range Status   MRSA by PCR NEGATIVE NEGATIVE Final    Comment:        The GeneXpert MRSA Assay (FDA approved for NASAL specimens only), is one component of a comprehensive MRSA colonization surveillance program. It is not intended to diagnose MRSA infection nor to guide or monitor treatment for MRSA infections.     RADIOLOGY:  Dg Chest Port 1 View  Result Date: 05/04/2016 CLINICAL DATA:  Dyspnea EXAM: PORTABLE CHEST 1 VIEW COMPARISON:  03/31/2016 FINDINGS: Patchy left base opacity could represent infection or atelectasis. No large effusions. Unchanged moderate cardiomegaly. IMPRESSION: Mild patchy left base opacity, possible atelectasis or early infectious infiltrate. Electronically Signed   By: Ellery Plunkaniel R Mitchell M.D.   On: 05/04/2016 04:41    EKG:   Orders  placed or performed during the hospital encounter of 05/01/16  . EKG 12-Lead  . EKG 12-Lead      Management plans discussed with the patient, family and they are in agreement.  CODE STATUS:     Code Status Orders        Start     Ordered   05/01/16 1618  Full code  Continuous     05/01/16 1617    Code Status History    Date Active  Date Inactive Code Status Order ID Comments User Context   04/17/2016  9:45 PM 04/21/2016  7:28 PM Full Code [id number]  Enid Baasadhika Kalisetti, MD Inpatient   03/31/2016 11:07 PM 04/03/2016  7:00 PM Full Code [id number]  Oralia Manisavid Willis, MD ED      TOTAL TIME TAKING CARE OF THIS PATIENT: 40 minutes.    Katharina CaperVAICKUTE,Love Chowning M.D on 05/04/2016 at 11:28 AM  Between 7am to 6pm - Pager - [phone]  After 6pm go to [email or web address] - password [email or web address] [facility]  [facility]  Office  [phone]  CC: Primary care physician; Mickey FarberHIES, DAVID, MD

## 2016-05-08 NOTE — Care Management Important Message (Signed)
Important Message  Patient Details  Name: Catherine Anderson MRN: [id number] Date of Birth: 1923/03/30   Medicare Important Message Given:  Yes    Chapman FitchBOWEN, Davanee Klinkner T, RN 05/08/2016, 9:58 AM

## 2016-05-15 DIAGNOSIS — I480 Paroxysmal atrial fibrillation: Secondary | ICD-10-CM | POA: Diagnosis not present

## 2016-05-15 DIAGNOSIS — R5381 Other malaise: Secondary | ICD-10-CM | POA: Diagnosis not present

## 2016-05-15 DIAGNOSIS — N184 Chronic kidney disease, stage 4 (severe): Secondary | ICD-10-CM | POA: Diagnosis not present

## 2016-05-15 DIAGNOSIS — R7989 Other specified abnormal findings of blood chemistry: Secondary | ICD-10-CM | POA: Diagnosis not present

## 2016-05-22 DIAGNOSIS — N184 Chronic kidney disease, stage 4 (severe): Secondary | ICD-10-CM | POA: Diagnosis not present

## 2016-05-22 DIAGNOSIS — N179 Acute kidney failure, unspecified: Secondary | ICD-10-CM | POA: Diagnosis not present

## 2016-05-22 DIAGNOSIS — E875 Hyperkalemia: Secondary | ICD-10-CM | POA: Diagnosis not present

## 2016-05-22 DIAGNOSIS — R809 Proteinuria, unspecified: Secondary | ICD-10-CM | POA: Diagnosis not present

## 2016-05-22 DIAGNOSIS — E872 Acidosis: Secondary | ICD-10-CM | POA: Diagnosis not present

## 2016-05-22 DIAGNOSIS — I129 Hypertensive chronic kidney disease with stage 1 through stage 4 chronic kidney disease, or unspecified chronic kidney disease: Secondary | ICD-10-CM | POA: Diagnosis not present

## 2016-05-29 DIAGNOSIS — I1311 Hypertensive heart and chronic kidney disease without heart failure, with stage 5 chronic kidney disease, or end stage renal disease: Secondary | ICD-10-CM | POA: Diagnosis not present

## 2016-05-29 DIAGNOSIS — I519 Heart disease, unspecified: Secondary | ICD-10-CM | POA: Diagnosis not present

## 2016-05-29 DIAGNOSIS — E039 Hypothyroidism, unspecified: Secondary | ICD-10-CM | POA: Diagnosis not present

## 2016-05-29 DIAGNOSIS — I480 Paroxysmal atrial fibrillation: Secondary | ICD-10-CM | POA: Diagnosis not present

## 2016-05-29 DIAGNOSIS — Z7982 Long term (current) use of aspirin: Secondary | ICD-10-CM | POA: Diagnosis not present

## 2016-05-29 DIAGNOSIS — R627 Adult failure to thrive: Secondary | ICD-10-CM | POA: Diagnosis not present

## 2016-05-29 DIAGNOSIS — E559 Vitamin D deficiency, unspecified: Secondary | ICD-10-CM | POA: Diagnosis not present

## 2016-05-29 DIAGNOSIS — I340 Nonrheumatic mitral (valve) insufficiency: Secondary | ICD-10-CM | POA: Diagnosis not present

## 2016-05-29 DIAGNOSIS — N186 End stage renal disease: Secondary | ICD-10-CM | POA: Diagnosis not present

## 2016-05-29 DIAGNOSIS — F329 Major depressive disorder, single episode, unspecified: Secondary | ICD-10-CM | POA: Diagnosis not present

## 2016-05-29 DIAGNOSIS — I2510 Atherosclerotic heart disease of native coronary artery without angina pectoris: Secondary | ICD-10-CM | POA: Diagnosis not present

## 2016-05-29 DIAGNOSIS — D631 Anemia in chronic kidney disease: Secondary | ICD-10-CM | POA: Diagnosis not present

## 2016-05-29 DIAGNOSIS — M109 Gout, unspecified: Secondary | ICD-10-CM | POA: Diagnosis not present

## 2016-05-29 DIAGNOSIS — I361 Nonrheumatic tricuspid (valve) insufficiency: Secondary | ICD-10-CM | POA: Diagnosis not present

## 2016-05-29 DIAGNOSIS — I471 Supraventricular tachycardia: Secondary | ICD-10-CM | POA: Diagnosis not present

## 2016-05-29 DIAGNOSIS — E785 Hyperlipidemia, unspecified: Secondary | ICD-10-CM | POA: Diagnosis not present

## 2016-06-01 DIAGNOSIS — I519 Heart disease, unspecified: Secondary | ICD-10-CM | POA: Diagnosis not present

## 2016-06-01 DIAGNOSIS — I1311 Hypertensive heart and chronic kidney disease without heart failure, with stage 5 chronic kidney disease, or end stage renal disease: Secondary | ICD-10-CM | POA: Diagnosis not present

## 2016-06-01 DIAGNOSIS — N186 End stage renal disease: Secondary | ICD-10-CM | POA: Diagnosis not present

## 2016-06-01 DIAGNOSIS — I471 Supraventricular tachycardia: Secondary | ICD-10-CM | POA: Diagnosis not present

## 2016-06-01 DIAGNOSIS — D631 Anemia in chronic kidney disease: Secondary | ICD-10-CM | POA: Diagnosis not present

## 2016-06-01 DIAGNOSIS — I2510 Atherosclerotic heart disease of native coronary artery without angina pectoris: Secondary | ICD-10-CM | POA: Diagnosis not present

## 2016-06-02 DIAGNOSIS — I1311 Hypertensive heart and chronic kidney disease without heart failure, with stage 5 chronic kidney disease, or end stage renal disease: Secondary | ICD-10-CM | POA: Diagnosis not present

## 2016-06-02 DIAGNOSIS — I471 Supraventricular tachycardia: Secondary | ICD-10-CM | POA: Diagnosis not present

## 2016-06-02 DIAGNOSIS — D631 Anemia in chronic kidney disease: Secondary | ICD-10-CM | POA: Diagnosis not present

## 2016-06-02 DIAGNOSIS — N186 End stage renal disease: Secondary | ICD-10-CM | POA: Diagnosis not present

## 2016-06-02 DIAGNOSIS — I519 Heart disease, unspecified: Secondary | ICD-10-CM | POA: Diagnosis not present

## 2016-06-02 DIAGNOSIS — I2510 Atherosclerotic heart disease of native coronary artery without angina pectoris: Secondary | ICD-10-CM | POA: Diagnosis not present

## 2016-06-03 DIAGNOSIS — N186 End stage renal disease: Secondary | ICD-10-CM | POA: Diagnosis not present

## 2016-06-03 DIAGNOSIS — D631 Anemia in chronic kidney disease: Secondary | ICD-10-CM | POA: Diagnosis not present

## 2016-06-03 DIAGNOSIS — I1311 Hypertensive heart and chronic kidney disease without heart failure, with stage 5 chronic kidney disease, or end stage renal disease: Secondary | ICD-10-CM | POA: Diagnosis not present

## 2016-06-03 DIAGNOSIS — I519 Heart disease, unspecified: Secondary | ICD-10-CM | POA: Diagnosis not present

## 2016-06-03 DIAGNOSIS — I2510 Atherosclerotic heart disease of native coronary artery without angina pectoris: Secondary | ICD-10-CM | POA: Diagnosis not present

## 2016-06-03 DIAGNOSIS — I471 Supraventricular tachycardia: Secondary | ICD-10-CM | POA: Diagnosis not present

## 2016-06-04 DIAGNOSIS — N186 End stage renal disease: Secondary | ICD-10-CM | POA: Diagnosis not present

## 2016-06-04 DIAGNOSIS — I1311 Hypertensive heart and chronic kidney disease without heart failure, with stage 5 chronic kidney disease, or end stage renal disease: Secondary | ICD-10-CM | POA: Diagnosis not present

## 2016-06-04 DIAGNOSIS — D631 Anemia in chronic kidney disease: Secondary | ICD-10-CM | POA: Diagnosis not present

## 2016-06-04 DIAGNOSIS — I471 Supraventricular tachycardia: Secondary | ICD-10-CM | POA: Diagnosis not present

## 2016-06-04 DIAGNOSIS — I519 Heart disease, unspecified: Secondary | ICD-10-CM | POA: Diagnosis not present

## 2016-06-04 DIAGNOSIS — I2510 Atherosclerotic heart disease of native coronary artery without angina pectoris: Secondary | ICD-10-CM | POA: Diagnosis not present

## 2016-06-05 DIAGNOSIS — N186 End stage renal disease: Secondary | ICD-10-CM | POA: Diagnosis not present

## 2016-06-05 DIAGNOSIS — D631 Anemia in chronic kidney disease: Secondary | ICD-10-CM | POA: Diagnosis not present

## 2016-06-05 DIAGNOSIS — I519 Heart disease, unspecified: Secondary | ICD-10-CM | POA: Diagnosis not present

## 2016-06-05 DIAGNOSIS — I1311 Hypertensive heart and chronic kidney disease without heart failure, with stage 5 chronic kidney disease, or end stage renal disease: Secondary | ICD-10-CM | POA: Diagnosis not present

## 2016-06-05 DIAGNOSIS — I471 Supraventricular tachycardia: Secondary | ICD-10-CM | POA: Diagnosis not present

## 2016-06-05 DIAGNOSIS — I2510 Atherosclerotic heart disease of native coronary artery without angina pectoris: Secondary | ICD-10-CM | POA: Diagnosis not present

## 2016-06-08 DIAGNOSIS — I519 Heart disease, unspecified: Secondary | ICD-10-CM | POA: Diagnosis not present

## 2016-06-08 DIAGNOSIS — N186 End stage renal disease: Secondary | ICD-10-CM | POA: Diagnosis not present

## 2016-06-08 DIAGNOSIS — I2510 Atherosclerotic heart disease of native coronary artery without angina pectoris: Secondary | ICD-10-CM | POA: Diagnosis not present

## 2016-06-08 DIAGNOSIS — D631 Anemia in chronic kidney disease: Secondary | ICD-10-CM | POA: Diagnosis not present

## 2016-06-08 DIAGNOSIS — I1311 Hypertensive heart and chronic kidney disease without heart failure, with stage 5 chronic kidney disease, or end stage renal disease: Secondary | ICD-10-CM | POA: Diagnosis not present

## 2016-06-08 DIAGNOSIS — I471 Supraventricular tachycardia: Secondary | ICD-10-CM | POA: Diagnosis not present

## 2016-06-09 DIAGNOSIS — I519 Heart disease, unspecified: Secondary | ICD-10-CM | POA: Diagnosis not present

## 2016-06-09 DIAGNOSIS — I471 Supraventricular tachycardia: Secondary | ICD-10-CM | POA: Diagnosis not present

## 2016-06-09 DIAGNOSIS — I2510 Atherosclerotic heart disease of native coronary artery without angina pectoris: Secondary | ICD-10-CM | POA: Diagnosis not present

## 2016-06-09 DIAGNOSIS — D631 Anemia in chronic kidney disease: Secondary | ICD-10-CM | POA: Diagnosis not present

## 2016-06-09 DIAGNOSIS — I1311 Hypertensive heart and chronic kidney disease without heart failure, with stage 5 chronic kidney disease, or end stage renal disease: Secondary | ICD-10-CM | POA: Diagnosis not present

## 2016-06-09 DIAGNOSIS — N186 End stage renal disease: Secondary | ICD-10-CM | POA: Diagnosis not present

## 2016-06-10 DIAGNOSIS — N186 End stage renal disease: Secondary | ICD-10-CM | POA: Diagnosis not present

## 2016-06-10 DIAGNOSIS — I519 Heart disease, unspecified: Secondary | ICD-10-CM | POA: Diagnosis not present

## 2016-06-10 DIAGNOSIS — I2510 Atherosclerotic heart disease of native coronary artery without angina pectoris: Secondary | ICD-10-CM | POA: Diagnosis not present

## 2016-06-10 DIAGNOSIS — I471 Supraventricular tachycardia: Secondary | ICD-10-CM | POA: Diagnosis not present

## 2016-06-10 DIAGNOSIS — D631 Anemia in chronic kidney disease: Secondary | ICD-10-CM | POA: Diagnosis not present

## 2016-06-10 DIAGNOSIS — I1311 Hypertensive heart and chronic kidney disease without heart failure, with stage 5 chronic kidney disease, or end stage renal disease: Secondary | ICD-10-CM | POA: Diagnosis not present

## 2016-06-11 DIAGNOSIS — N186 End stage renal disease: Secondary | ICD-10-CM | POA: Diagnosis not present

## 2016-06-11 DIAGNOSIS — I519 Heart disease, unspecified: Secondary | ICD-10-CM | POA: Diagnosis not present

## 2016-06-11 DIAGNOSIS — I471 Supraventricular tachycardia: Secondary | ICD-10-CM | POA: Diagnosis not present

## 2016-06-11 DIAGNOSIS — I2510 Atherosclerotic heart disease of native coronary artery without angina pectoris: Secondary | ICD-10-CM | POA: Diagnosis not present

## 2016-06-11 DIAGNOSIS — I1311 Hypertensive heart and chronic kidney disease without heart failure, with stage 5 chronic kidney disease, or end stage renal disease: Secondary | ICD-10-CM | POA: Diagnosis not present

## 2016-06-11 DIAGNOSIS — D631 Anemia in chronic kidney disease: Secondary | ICD-10-CM | POA: Diagnosis not present

## 2016-06-12 DIAGNOSIS — I471 Supraventricular tachycardia: Secondary | ICD-10-CM | POA: Diagnosis not present

## 2016-06-12 DIAGNOSIS — N186 End stage renal disease: Secondary | ICD-10-CM | POA: Diagnosis not present

## 2016-06-12 DIAGNOSIS — D631 Anemia in chronic kidney disease: Secondary | ICD-10-CM | POA: Diagnosis not present

## 2016-06-12 DIAGNOSIS — I519 Heart disease, unspecified: Secondary | ICD-10-CM | POA: Diagnosis not present

## 2016-06-12 DIAGNOSIS — I1311 Hypertensive heart and chronic kidney disease without heart failure, with stage 5 chronic kidney disease, or end stage renal disease: Secondary | ICD-10-CM | POA: Diagnosis not present

## 2016-06-12 DIAGNOSIS — I2510 Atherosclerotic heart disease of native coronary artery without angina pectoris: Secondary | ICD-10-CM | POA: Diagnosis not present

## 2016-06-15 DIAGNOSIS — I519 Heart disease, unspecified: Secondary | ICD-10-CM | POA: Diagnosis not present

## 2016-06-15 DIAGNOSIS — N186 End stage renal disease: Secondary | ICD-10-CM | POA: Diagnosis not present

## 2016-06-15 DIAGNOSIS — D631 Anemia in chronic kidney disease: Secondary | ICD-10-CM | POA: Diagnosis not present

## 2016-06-15 DIAGNOSIS — I471 Supraventricular tachycardia: Secondary | ICD-10-CM | POA: Diagnosis not present

## 2016-06-15 DIAGNOSIS — I1311 Hypertensive heart and chronic kidney disease without heart failure, with stage 5 chronic kidney disease, or end stage renal disease: Secondary | ICD-10-CM | POA: Diagnosis not present

## 2016-06-15 DIAGNOSIS — I2510 Atherosclerotic heart disease of native coronary artery without angina pectoris: Secondary | ICD-10-CM | POA: Diagnosis not present

## 2016-06-16 DIAGNOSIS — D631 Anemia in chronic kidney disease: Secondary | ICD-10-CM | POA: Diagnosis not present

## 2016-06-16 DIAGNOSIS — I519 Heart disease, unspecified: Secondary | ICD-10-CM | POA: Diagnosis not present

## 2016-06-16 DIAGNOSIS — I1311 Hypertensive heart and chronic kidney disease without heart failure, with stage 5 chronic kidney disease, or end stage renal disease: Secondary | ICD-10-CM | POA: Diagnosis not present

## 2016-06-16 DIAGNOSIS — I2510 Atherosclerotic heart disease of native coronary artery without angina pectoris: Secondary | ICD-10-CM | POA: Diagnosis not present

## 2016-06-16 DIAGNOSIS — N186 End stage renal disease: Secondary | ICD-10-CM | POA: Diagnosis not present

## 2016-06-16 DIAGNOSIS — I471 Supraventricular tachycardia: Secondary | ICD-10-CM | POA: Diagnosis not present

## 2016-06-17 DIAGNOSIS — I519 Heart disease, unspecified: Secondary | ICD-10-CM | POA: Diagnosis not present

## 2016-06-17 DIAGNOSIS — N186 End stage renal disease: Secondary | ICD-10-CM | POA: Diagnosis not present

## 2016-06-17 DIAGNOSIS — I1311 Hypertensive heart and chronic kidney disease without heart failure, with stage 5 chronic kidney disease, or end stage renal disease: Secondary | ICD-10-CM | POA: Diagnosis not present

## 2016-06-17 DIAGNOSIS — I471 Supraventricular tachycardia: Secondary | ICD-10-CM | POA: Diagnosis not present

## 2016-06-17 DIAGNOSIS — I2510 Atherosclerotic heart disease of native coronary artery without angina pectoris: Secondary | ICD-10-CM | POA: Diagnosis not present

## 2016-06-17 DIAGNOSIS — D631 Anemia in chronic kidney disease: Secondary | ICD-10-CM | POA: Diagnosis not present

## 2016-06-18 DIAGNOSIS — I471 Supraventricular tachycardia: Secondary | ICD-10-CM | POA: Diagnosis not present

## 2016-06-18 DIAGNOSIS — I519 Heart disease, unspecified: Secondary | ICD-10-CM | POA: Diagnosis not present

## 2016-06-18 DIAGNOSIS — N186 End stage renal disease: Secondary | ICD-10-CM | POA: Diagnosis not present

## 2016-06-18 DIAGNOSIS — I1311 Hypertensive heart and chronic kidney disease without heart failure, with stage 5 chronic kidney disease, or end stage renal disease: Secondary | ICD-10-CM | POA: Diagnosis not present

## 2016-06-18 DIAGNOSIS — D631 Anemia in chronic kidney disease: Secondary | ICD-10-CM | POA: Diagnosis not present

## 2016-06-18 DIAGNOSIS — I2510 Atherosclerotic heart disease of native coronary artery without angina pectoris: Secondary | ICD-10-CM | POA: Diagnosis not present

## 2016-06-19 DIAGNOSIS — I1311 Hypertensive heart and chronic kidney disease without heart failure, with stage 5 chronic kidney disease, or end stage renal disease: Secondary | ICD-10-CM | POA: Diagnosis not present

## 2016-06-19 DIAGNOSIS — I519 Heart disease, unspecified: Secondary | ICD-10-CM | POA: Diagnosis not present

## 2016-06-19 DIAGNOSIS — N186 End stage renal disease: Secondary | ICD-10-CM | POA: Diagnosis not present

## 2016-06-19 DIAGNOSIS — I2510 Atherosclerotic heart disease of native coronary artery without angina pectoris: Secondary | ICD-10-CM | POA: Diagnosis not present

## 2016-06-19 DIAGNOSIS — D631 Anemia in chronic kidney disease: Secondary | ICD-10-CM | POA: Diagnosis not present

## 2016-06-19 DIAGNOSIS — I471 Supraventricular tachycardia: Secondary | ICD-10-CM | POA: Diagnosis not present

## 2016-06-22 DIAGNOSIS — M109 Gout, unspecified: Secondary | ICD-10-CM | POA: Diagnosis not present

## 2016-06-22 DIAGNOSIS — E559 Vitamin D deficiency, unspecified: Secondary | ICD-10-CM | POA: Diagnosis not present

## 2016-06-22 DIAGNOSIS — I480 Paroxysmal atrial fibrillation: Secondary | ICD-10-CM | POA: Diagnosis not present

## 2016-06-22 DIAGNOSIS — Z7982 Long term (current) use of aspirin: Secondary | ICD-10-CM | POA: Diagnosis not present

## 2016-06-22 DIAGNOSIS — E039 Hypothyroidism, unspecified: Secondary | ICD-10-CM | POA: Diagnosis not present

## 2016-06-22 DIAGNOSIS — D631 Anemia in chronic kidney disease: Secondary | ICD-10-CM | POA: Diagnosis not present

## 2016-06-22 DIAGNOSIS — E785 Hyperlipidemia, unspecified: Secondary | ICD-10-CM | POA: Diagnosis not present

## 2016-06-22 DIAGNOSIS — N186 End stage renal disease: Secondary | ICD-10-CM | POA: Diagnosis not present

## 2016-06-22 DIAGNOSIS — I1311 Hypertensive heart and chronic kidney disease without heart failure, with stage 5 chronic kidney disease, or end stage renal disease: Secondary | ICD-10-CM | POA: Diagnosis not present

## 2016-06-22 DIAGNOSIS — I340 Nonrheumatic mitral (valve) insufficiency: Secondary | ICD-10-CM | POA: Diagnosis not present

## 2016-06-22 DIAGNOSIS — I471 Supraventricular tachycardia: Secondary | ICD-10-CM | POA: Diagnosis not present

## 2016-06-22 DIAGNOSIS — I2510 Atherosclerotic heart disease of native coronary artery without angina pectoris: Secondary | ICD-10-CM | POA: Diagnosis not present

## 2016-06-22 DIAGNOSIS — I361 Nonrheumatic tricuspid (valve) insufficiency: Secondary | ICD-10-CM | POA: Diagnosis not present

## 2016-06-22 DIAGNOSIS — F329 Major depressive disorder, single episode, unspecified: Secondary | ICD-10-CM | POA: Diagnosis not present

## 2016-06-22 DIAGNOSIS — R627 Adult failure to thrive: Secondary | ICD-10-CM | POA: Diagnosis not present

## 2016-06-22 DIAGNOSIS — I519 Heart disease, unspecified: Secondary | ICD-10-CM | POA: Diagnosis not present

## 2016-06-23 DIAGNOSIS — I1311 Hypertensive heart and chronic kidney disease without heart failure, with stage 5 chronic kidney disease, or end stage renal disease: Secondary | ICD-10-CM | POA: Diagnosis not present

## 2016-06-23 DIAGNOSIS — I2510 Atherosclerotic heart disease of native coronary artery without angina pectoris: Secondary | ICD-10-CM | POA: Diagnosis not present

## 2016-06-23 DIAGNOSIS — D631 Anemia in chronic kidney disease: Secondary | ICD-10-CM | POA: Diagnosis not present

## 2016-06-23 DIAGNOSIS — N186 End stage renal disease: Secondary | ICD-10-CM | POA: Diagnosis not present

## 2016-06-23 DIAGNOSIS — I519 Heart disease, unspecified: Secondary | ICD-10-CM | POA: Diagnosis not present

## 2016-06-23 DIAGNOSIS — I471 Supraventricular tachycardia: Secondary | ICD-10-CM | POA: Diagnosis not present

## 2016-06-24 DIAGNOSIS — I519 Heart disease, unspecified: Secondary | ICD-10-CM | POA: Diagnosis not present

## 2016-06-24 DIAGNOSIS — N186 End stage renal disease: Secondary | ICD-10-CM | POA: Diagnosis not present

## 2016-06-24 DIAGNOSIS — I2510 Atherosclerotic heart disease of native coronary artery without angina pectoris: Secondary | ICD-10-CM | POA: Diagnosis not present

## 2016-06-24 DIAGNOSIS — I1311 Hypertensive heart and chronic kidney disease without heart failure, with stage 5 chronic kidney disease, or end stage renal disease: Secondary | ICD-10-CM | POA: Diagnosis not present

## 2016-06-24 DIAGNOSIS — I471 Supraventricular tachycardia: Secondary | ICD-10-CM | POA: Diagnosis not present

## 2016-06-24 DIAGNOSIS — D631 Anemia in chronic kidney disease: Secondary | ICD-10-CM | POA: Diagnosis not present

## 2016-06-25 DIAGNOSIS — D631 Anemia in chronic kidney disease: Secondary | ICD-10-CM | POA: Diagnosis not present

## 2016-06-25 DIAGNOSIS — I2510 Atherosclerotic heart disease of native coronary artery without angina pectoris: Secondary | ICD-10-CM | POA: Diagnosis not present

## 2016-06-25 DIAGNOSIS — I1311 Hypertensive heart and chronic kidney disease without heart failure, with stage 5 chronic kidney disease, or end stage renal disease: Secondary | ICD-10-CM | POA: Diagnosis not present

## 2016-06-25 DIAGNOSIS — I519 Heart disease, unspecified: Secondary | ICD-10-CM | POA: Diagnosis not present

## 2016-06-25 DIAGNOSIS — I471 Supraventricular tachycardia: Secondary | ICD-10-CM | POA: Diagnosis not present

## 2016-06-25 DIAGNOSIS — N186 End stage renal disease: Secondary | ICD-10-CM | POA: Diagnosis not present

## 2016-06-26 DIAGNOSIS — I471 Supraventricular tachycardia: Secondary | ICD-10-CM | POA: Diagnosis not present

## 2016-06-26 DIAGNOSIS — I519 Heart disease, unspecified: Secondary | ICD-10-CM | POA: Diagnosis not present

## 2016-06-26 DIAGNOSIS — I1311 Hypertensive heart and chronic kidney disease without heart failure, with stage 5 chronic kidney disease, or end stage renal disease: Secondary | ICD-10-CM | POA: Diagnosis not present

## 2016-06-26 DIAGNOSIS — D631 Anemia in chronic kidney disease: Secondary | ICD-10-CM | POA: Diagnosis not present

## 2016-06-26 DIAGNOSIS — I2510 Atherosclerotic heart disease of native coronary artery without angina pectoris: Secondary | ICD-10-CM | POA: Diagnosis not present

## 2016-06-26 DIAGNOSIS — N186 End stage renal disease: Secondary | ICD-10-CM | POA: Diagnosis not present

## 2016-06-29 DIAGNOSIS — I2510 Atherosclerotic heart disease of native coronary artery without angina pectoris: Secondary | ICD-10-CM | POA: Diagnosis not present

## 2016-06-29 DIAGNOSIS — I471 Supraventricular tachycardia: Secondary | ICD-10-CM | POA: Diagnosis not present

## 2016-06-29 DIAGNOSIS — N186 End stage renal disease: Secondary | ICD-10-CM | POA: Diagnosis not present

## 2016-06-29 DIAGNOSIS — D631 Anemia in chronic kidney disease: Secondary | ICD-10-CM | POA: Diagnosis not present

## 2016-06-29 DIAGNOSIS — I519 Heart disease, unspecified: Secondary | ICD-10-CM | POA: Diagnosis not present

## 2016-06-29 DIAGNOSIS — I1311 Hypertensive heart and chronic kidney disease without heart failure, with stage 5 chronic kidney disease, or end stage renal disease: Secondary | ICD-10-CM | POA: Diagnosis not present

## 2016-06-30 DIAGNOSIS — I471 Supraventricular tachycardia: Secondary | ICD-10-CM | POA: Diagnosis not present

## 2016-06-30 DIAGNOSIS — I2510 Atherosclerotic heart disease of native coronary artery without angina pectoris: Secondary | ICD-10-CM | POA: Diagnosis not present

## 2016-06-30 DIAGNOSIS — D631 Anemia in chronic kidney disease: Secondary | ICD-10-CM | POA: Diagnosis not present

## 2016-06-30 DIAGNOSIS — N186 End stage renal disease: Secondary | ICD-10-CM | POA: Diagnosis not present

## 2016-06-30 DIAGNOSIS — I519 Heart disease, unspecified: Secondary | ICD-10-CM | POA: Diagnosis not present

## 2016-06-30 DIAGNOSIS — I1311 Hypertensive heart and chronic kidney disease without heart failure, with stage 5 chronic kidney disease, or end stage renal disease: Secondary | ICD-10-CM | POA: Diagnosis not present

## 2016-07-01 DIAGNOSIS — I2510 Atherosclerotic heart disease of native coronary artery without angina pectoris: Secondary | ICD-10-CM | POA: Diagnosis not present

## 2016-07-01 DIAGNOSIS — I471 Supraventricular tachycardia: Secondary | ICD-10-CM | POA: Diagnosis not present

## 2016-07-01 DIAGNOSIS — I519 Heart disease, unspecified: Secondary | ICD-10-CM | POA: Diagnosis not present

## 2016-07-01 DIAGNOSIS — I1311 Hypertensive heart and chronic kidney disease without heart failure, with stage 5 chronic kidney disease, or end stage renal disease: Secondary | ICD-10-CM | POA: Diagnosis not present

## 2016-07-01 DIAGNOSIS — N186 End stage renal disease: Secondary | ICD-10-CM | POA: Diagnosis not present

## 2016-07-01 DIAGNOSIS — D631 Anemia in chronic kidney disease: Secondary | ICD-10-CM | POA: Diagnosis not present

## 2016-07-02 DIAGNOSIS — N186 End stage renal disease: Secondary | ICD-10-CM | POA: Diagnosis not present

## 2016-07-02 DIAGNOSIS — I2510 Atherosclerotic heart disease of native coronary artery without angina pectoris: Secondary | ICD-10-CM | POA: Diagnosis not present

## 2016-07-02 DIAGNOSIS — I1311 Hypertensive heart and chronic kidney disease without heart failure, with stage 5 chronic kidney disease, or end stage renal disease: Secondary | ICD-10-CM | POA: Diagnosis not present

## 2016-07-02 DIAGNOSIS — I519 Heart disease, unspecified: Secondary | ICD-10-CM | POA: Diagnosis not present

## 2016-07-02 DIAGNOSIS — I471 Supraventricular tachycardia: Secondary | ICD-10-CM | POA: Diagnosis not present

## 2016-07-02 DIAGNOSIS — D631 Anemia in chronic kidney disease: Secondary | ICD-10-CM | POA: Diagnosis not present

## 2016-07-03 DIAGNOSIS — I2510 Atherosclerotic heart disease of native coronary artery without angina pectoris: Secondary | ICD-10-CM | POA: Diagnosis not present

## 2016-07-03 DIAGNOSIS — I471 Supraventricular tachycardia: Secondary | ICD-10-CM | POA: Diagnosis not present

## 2016-07-03 DIAGNOSIS — D631 Anemia in chronic kidney disease: Secondary | ICD-10-CM | POA: Diagnosis not present

## 2016-07-03 DIAGNOSIS — I1311 Hypertensive heart and chronic kidney disease without heart failure, with stage 5 chronic kidney disease, or end stage renal disease: Secondary | ICD-10-CM | POA: Diagnosis not present

## 2016-07-03 DIAGNOSIS — N186 End stage renal disease: Secondary | ICD-10-CM | POA: Diagnosis not present

## 2016-07-03 DIAGNOSIS — I519 Heart disease, unspecified: Secondary | ICD-10-CM | POA: Diagnosis not present

## 2016-07-06 DIAGNOSIS — N186 End stage renal disease: Secondary | ICD-10-CM | POA: Diagnosis not present

## 2016-07-06 DIAGNOSIS — I519 Heart disease, unspecified: Secondary | ICD-10-CM | POA: Diagnosis not present

## 2016-07-06 DIAGNOSIS — I471 Supraventricular tachycardia: Secondary | ICD-10-CM | POA: Diagnosis not present

## 2016-07-06 DIAGNOSIS — D631 Anemia in chronic kidney disease: Secondary | ICD-10-CM | POA: Diagnosis not present

## 2016-07-06 DIAGNOSIS — I2510 Atherosclerotic heart disease of native coronary artery without angina pectoris: Secondary | ICD-10-CM | POA: Diagnosis not present

## 2016-07-06 DIAGNOSIS — I1311 Hypertensive heart and chronic kidney disease without heart failure, with stage 5 chronic kidney disease, or end stage renal disease: Secondary | ICD-10-CM | POA: Diagnosis not present

## 2016-07-07 DIAGNOSIS — I1311 Hypertensive heart and chronic kidney disease without heart failure, with stage 5 chronic kidney disease, or end stage renal disease: Secondary | ICD-10-CM | POA: Diagnosis not present

## 2016-07-07 DIAGNOSIS — I471 Supraventricular tachycardia: Secondary | ICD-10-CM | POA: Diagnosis not present

## 2016-07-07 DIAGNOSIS — N186 End stage renal disease: Secondary | ICD-10-CM | POA: Diagnosis not present

## 2016-07-07 DIAGNOSIS — D631 Anemia in chronic kidney disease: Secondary | ICD-10-CM | POA: Diagnosis not present

## 2016-07-07 DIAGNOSIS — I2510 Atherosclerotic heart disease of native coronary artery without angina pectoris: Secondary | ICD-10-CM | POA: Diagnosis not present

## 2016-07-07 DIAGNOSIS — I519 Heart disease, unspecified: Secondary | ICD-10-CM | POA: Diagnosis not present

## 2016-07-08 DIAGNOSIS — D631 Anemia in chronic kidney disease: Secondary | ICD-10-CM | POA: Diagnosis not present

## 2016-07-08 DIAGNOSIS — I519 Heart disease, unspecified: Secondary | ICD-10-CM | POA: Diagnosis not present

## 2016-07-08 DIAGNOSIS — I471 Supraventricular tachycardia: Secondary | ICD-10-CM | POA: Diagnosis not present

## 2016-07-08 DIAGNOSIS — I1311 Hypertensive heart and chronic kidney disease without heart failure, with stage 5 chronic kidney disease, or end stage renal disease: Secondary | ICD-10-CM | POA: Diagnosis not present

## 2016-07-08 DIAGNOSIS — N186 End stage renal disease: Secondary | ICD-10-CM | POA: Diagnosis not present

## 2016-07-08 DIAGNOSIS — I2510 Atherosclerotic heart disease of native coronary artery without angina pectoris: Secondary | ICD-10-CM | POA: Diagnosis not present

## 2016-07-09 DIAGNOSIS — D631 Anemia in chronic kidney disease: Secondary | ICD-10-CM | POA: Diagnosis not present

## 2016-07-09 DIAGNOSIS — I519 Heart disease, unspecified: Secondary | ICD-10-CM | POA: Diagnosis not present

## 2016-07-09 DIAGNOSIS — I471 Supraventricular tachycardia: Secondary | ICD-10-CM | POA: Diagnosis not present

## 2016-07-09 DIAGNOSIS — N186 End stage renal disease: Secondary | ICD-10-CM | POA: Diagnosis not present

## 2016-07-09 DIAGNOSIS — I2510 Atherosclerotic heart disease of native coronary artery without angina pectoris: Secondary | ICD-10-CM | POA: Diagnosis not present

## 2016-07-09 DIAGNOSIS — I1311 Hypertensive heart and chronic kidney disease without heart failure, with stage 5 chronic kidney disease, or end stage renal disease: Secondary | ICD-10-CM | POA: Diagnosis not present

## 2016-07-10 DIAGNOSIS — N186 End stage renal disease: Secondary | ICD-10-CM | POA: Diagnosis not present

## 2016-07-10 DIAGNOSIS — I519 Heart disease, unspecified: Secondary | ICD-10-CM | POA: Diagnosis not present

## 2016-07-10 DIAGNOSIS — D631 Anemia in chronic kidney disease: Secondary | ICD-10-CM | POA: Diagnosis not present

## 2016-07-10 DIAGNOSIS — I2510 Atherosclerotic heart disease of native coronary artery without angina pectoris: Secondary | ICD-10-CM | POA: Diagnosis not present

## 2016-07-10 DIAGNOSIS — I471 Supraventricular tachycardia: Secondary | ICD-10-CM | POA: Diagnosis not present

## 2016-07-10 DIAGNOSIS — I1311 Hypertensive heart and chronic kidney disease without heart failure, with stage 5 chronic kidney disease, or end stage renal disease: Secondary | ICD-10-CM | POA: Diagnosis not present

## 2016-07-13 DIAGNOSIS — I1311 Hypertensive heart and chronic kidney disease without heart failure, with stage 5 chronic kidney disease, or end stage renal disease: Secondary | ICD-10-CM | POA: Diagnosis not present

## 2016-07-13 DIAGNOSIS — N186 End stage renal disease: Secondary | ICD-10-CM | POA: Diagnosis not present

## 2016-07-13 DIAGNOSIS — I471 Supraventricular tachycardia: Secondary | ICD-10-CM | POA: Diagnosis not present

## 2016-07-13 DIAGNOSIS — I2510 Atherosclerotic heart disease of native coronary artery without angina pectoris: Secondary | ICD-10-CM | POA: Diagnosis not present

## 2016-07-13 DIAGNOSIS — D631 Anemia in chronic kidney disease: Secondary | ICD-10-CM | POA: Diagnosis not present

## 2016-07-13 DIAGNOSIS — I519 Heart disease, unspecified: Secondary | ICD-10-CM | POA: Diagnosis not present

## 2016-07-14 DIAGNOSIS — I471 Supraventricular tachycardia: Secondary | ICD-10-CM | POA: Diagnosis not present

## 2016-07-14 DIAGNOSIS — I519 Heart disease, unspecified: Secondary | ICD-10-CM | POA: Diagnosis not present

## 2016-07-14 DIAGNOSIS — I1311 Hypertensive heart and chronic kidney disease without heart failure, with stage 5 chronic kidney disease, or end stage renal disease: Secondary | ICD-10-CM | POA: Diagnosis not present

## 2016-07-14 DIAGNOSIS — N186 End stage renal disease: Secondary | ICD-10-CM | POA: Diagnosis not present

## 2016-07-14 DIAGNOSIS — D631 Anemia in chronic kidney disease: Secondary | ICD-10-CM | POA: Diagnosis not present

## 2016-07-14 DIAGNOSIS — I2510 Atherosclerotic heart disease of native coronary artery without angina pectoris: Secondary | ICD-10-CM | POA: Diagnosis not present

## 2016-07-15 DIAGNOSIS — I471 Supraventricular tachycardia: Secondary | ICD-10-CM | POA: Diagnosis not present

## 2016-07-15 DIAGNOSIS — I519 Heart disease, unspecified: Secondary | ICD-10-CM | POA: Diagnosis not present

## 2016-07-15 DIAGNOSIS — I2510 Atherosclerotic heart disease of native coronary artery without angina pectoris: Secondary | ICD-10-CM | POA: Diagnosis not present

## 2016-07-15 DIAGNOSIS — N186 End stage renal disease: Secondary | ICD-10-CM | POA: Diagnosis not present

## 2016-07-15 DIAGNOSIS — D631 Anemia in chronic kidney disease: Secondary | ICD-10-CM | POA: Diagnosis not present

## 2016-07-15 DIAGNOSIS — I1311 Hypertensive heart and chronic kidney disease without heart failure, with stage 5 chronic kidney disease, or end stage renal disease: Secondary | ICD-10-CM | POA: Diagnosis not present

## 2016-07-16 DIAGNOSIS — I471 Supraventricular tachycardia: Secondary | ICD-10-CM | POA: Diagnosis not present

## 2016-07-16 DIAGNOSIS — R05 Cough: Secondary | ICD-10-CM | POA: Diagnosis not present

## 2016-07-16 DIAGNOSIS — I2510 Atherosclerotic heart disease of native coronary artery without angina pectoris: Secondary | ICD-10-CM | POA: Diagnosis not present

## 2016-07-16 DIAGNOSIS — I519 Heart disease, unspecified: Secondary | ICD-10-CM | POA: Diagnosis not present

## 2016-07-16 DIAGNOSIS — I1311 Hypertensive heart and chronic kidney disease without heart failure, with stage 5 chronic kidney disease, or end stage renal disease: Secondary | ICD-10-CM | POA: Diagnosis not present

## 2016-07-16 DIAGNOSIS — D631 Anemia in chronic kidney disease: Secondary | ICD-10-CM | POA: Diagnosis not present

## 2016-07-16 DIAGNOSIS — N186 End stage renal disease: Secondary | ICD-10-CM | POA: Diagnosis not present

## 2016-07-17 DIAGNOSIS — I1311 Hypertensive heart and chronic kidney disease without heart failure, with stage 5 chronic kidney disease, or end stage renal disease: Secondary | ICD-10-CM | POA: Diagnosis not present

## 2016-07-17 DIAGNOSIS — N186 End stage renal disease: Secondary | ICD-10-CM | POA: Diagnosis not present

## 2016-07-17 DIAGNOSIS — I519 Heart disease, unspecified: Secondary | ICD-10-CM | POA: Diagnosis not present

## 2016-07-17 DIAGNOSIS — I2510 Atherosclerotic heart disease of native coronary artery without angina pectoris: Secondary | ICD-10-CM | POA: Diagnosis not present

## 2016-07-17 DIAGNOSIS — D631 Anemia in chronic kidney disease: Secondary | ICD-10-CM | POA: Diagnosis not present

## 2016-07-17 DIAGNOSIS — I471 Supraventricular tachycardia: Secondary | ICD-10-CM | POA: Diagnosis not present

## 2016-07-18 DIAGNOSIS — I519 Heart disease, unspecified: Secondary | ICD-10-CM | POA: Diagnosis not present

## 2016-07-18 DIAGNOSIS — I1311 Hypertensive heart and chronic kidney disease without heart failure, with stage 5 chronic kidney disease, or end stage renal disease: Secondary | ICD-10-CM | POA: Diagnosis not present

## 2016-07-18 DIAGNOSIS — I2510 Atherosclerotic heart disease of native coronary artery without angina pectoris: Secondary | ICD-10-CM | POA: Diagnosis not present

## 2016-07-18 DIAGNOSIS — N186 End stage renal disease: Secondary | ICD-10-CM | POA: Diagnosis not present

## 2016-07-18 DIAGNOSIS — D631 Anemia in chronic kidney disease: Secondary | ICD-10-CM | POA: Diagnosis not present

## 2016-07-18 DIAGNOSIS — I471 Supraventricular tachycardia: Secondary | ICD-10-CM | POA: Diagnosis not present

## 2016-07-20 DIAGNOSIS — I1311 Hypertensive heart and chronic kidney disease without heart failure, with stage 5 chronic kidney disease, or end stage renal disease: Secondary | ICD-10-CM | POA: Diagnosis not present

## 2016-07-20 DIAGNOSIS — I471 Supraventricular tachycardia: Secondary | ICD-10-CM | POA: Diagnosis not present

## 2016-07-20 DIAGNOSIS — N186 End stage renal disease: Secondary | ICD-10-CM | POA: Diagnosis not present

## 2016-07-20 DIAGNOSIS — D631 Anemia in chronic kidney disease: Secondary | ICD-10-CM | POA: Diagnosis not present

## 2016-07-20 DIAGNOSIS — I519 Heart disease, unspecified: Secondary | ICD-10-CM | POA: Diagnosis not present

## 2016-07-20 DIAGNOSIS — I2510 Atherosclerotic heart disease of native coronary artery without angina pectoris: Secondary | ICD-10-CM | POA: Diagnosis not present

## 2016-07-21 DIAGNOSIS — I2510 Atherosclerotic heart disease of native coronary artery without angina pectoris: Secondary | ICD-10-CM | POA: Diagnosis not present

## 2016-07-21 DIAGNOSIS — N186 End stage renal disease: Secondary | ICD-10-CM | POA: Diagnosis not present

## 2016-07-21 DIAGNOSIS — I519 Heart disease, unspecified: Secondary | ICD-10-CM | POA: Diagnosis not present

## 2016-07-21 DIAGNOSIS — I1311 Hypertensive heart and chronic kidney disease without heart failure, with stage 5 chronic kidney disease, or end stage renal disease: Secondary | ICD-10-CM | POA: Diagnosis not present

## 2016-07-21 DIAGNOSIS — D631 Anemia in chronic kidney disease: Secondary | ICD-10-CM | POA: Diagnosis not present

## 2016-07-21 DIAGNOSIS — I471 Supraventricular tachycardia: Secondary | ICD-10-CM | POA: Diagnosis not present

## 2016-07-22 DIAGNOSIS — I1311 Hypertensive heart and chronic kidney disease without heart failure, with stage 5 chronic kidney disease, or end stage renal disease: Secondary | ICD-10-CM | POA: Diagnosis not present

## 2016-07-22 DIAGNOSIS — I519 Heart disease, unspecified: Secondary | ICD-10-CM | POA: Diagnosis not present

## 2016-07-22 DIAGNOSIS — I471 Supraventricular tachycardia: Secondary | ICD-10-CM | POA: Diagnosis not present

## 2016-07-22 DIAGNOSIS — D631 Anemia in chronic kidney disease: Secondary | ICD-10-CM | POA: Diagnosis not present

## 2016-07-22 DIAGNOSIS — N186 End stage renal disease: Secondary | ICD-10-CM | POA: Diagnosis not present

## 2016-07-22 DIAGNOSIS — I2510 Atherosclerotic heart disease of native coronary artery without angina pectoris: Secondary | ICD-10-CM | POA: Diagnosis not present

## 2016-07-23 DIAGNOSIS — F329 Major depressive disorder, single episode, unspecified: Secondary | ICD-10-CM | POA: Diagnosis not present

## 2016-07-23 DIAGNOSIS — I471 Supraventricular tachycardia: Secondary | ICD-10-CM | POA: Diagnosis not present

## 2016-07-23 DIAGNOSIS — I361 Nonrheumatic tricuspid (valve) insufficiency: Secondary | ICD-10-CM | POA: Diagnosis not present

## 2016-07-23 DIAGNOSIS — Z7982 Long term (current) use of aspirin: Secondary | ICD-10-CM | POA: Diagnosis not present

## 2016-07-23 DIAGNOSIS — I340 Nonrheumatic mitral (valve) insufficiency: Secondary | ICD-10-CM | POA: Diagnosis not present

## 2016-07-23 DIAGNOSIS — M109 Gout, unspecified: Secondary | ICD-10-CM | POA: Diagnosis not present

## 2016-07-23 DIAGNOSIS — J188 Other pneumonia, unspecified organism: Secondary | ICD-10-CM | POA: Diagnosis not present

## 2016-07-23 DIAGNOSIS — I2510 Atherosclerotic heart disease of native coronary artery without angina pectoris: Secondary | ICD-10-CM | POA: Diagnosis not present

## 2016-07-23 DIAGNOSIS — E039 Hypothyroidism, unspecified: Secondary | ICD-10-CM | POA: Diagnosis not present

## 2016-07-23 DIAGNOSIS — E559 Vitamin D deficiency, unspecified: Secondary | ICD-10-CM | POA: Diagnosis not present

## 2016-07-23 DIAGNOSIS — E785 Hyperlipidemia, unspecified: Secondary | ICD-10-CM | POA: Diagnosis not present

## 2016-07-23 DIAGNOSIS — I10 Essential (primary) hypertension: Secondary | ICD-10-CM | POA: Diagnosis not present

## 2016-07-23 DIAGNOSIS — N186 End stage renal disease: Secondary | ICD-10-CM | POA: Diagnosis not present

## 2016-07-23 DIAGNOSIS — I480 Paroxysmal atrial fibrillation: Secondary | ICD-10-CM | POA: Diagnosis not present

## 2016-07-23 DIAGNOSIS — D631 Anemia in chronic kidney disease: Secondary | ICD-10-CM | POA: Diagnosis not present

## 2016-07-23 DIAGNOSIS — I1311 Hypertensive heart and chronic kidney disease without heart failure, with stage 5 chronic kidney disease, or end stage renal disease: Secondary | ICD-10-CM | POA: Diagnosis not present

## 2016-07-23 DIAGNOSIS — I519 Heart disease, unspecified: Secondary | ICD-10-CM | POA: Diagnosis not present

## 2016-07-24 DIAGNOSIS — I1311 Hypertensive heart and chronic kidney disease without heart failure, with stage 5 chronic kidney disease, or end stage renal disease: Secondary | ICD-10-CM | POA: Diagnosis not present

## 2016-07-24 DIAGNOSIS — D631 Anemia in chronic kidney disease: Secondary | ICD-10-CM | POA: Diagnosis not present

## 2016-07-24 DIAGNOSIS — I471 Supraventricular tachycardia: Secondary | ICD-10-CM | POA: Diagnosis not present

## 2016-07-24 DIAGNOSIS — I519 Heart disease, unspecified: Secondary | ICD-10-CM | POA: Diagnosis not present

## 2016-07-24 DIAGNOSIS — I2510 Atherosclerotic heart disease of native coronary artery without angina pectoris: Secondary | ICD-10-CM | POA: Diagnosis not present

## 2016-07-24 DIAGNOSIS — N186 End stage renal disease: Secondary | ICD-10-CM | POA: Diagnosis not present

## 2016-07-27 DIAGNOSIS — N186 End stage renal disease: Secondary | ICD-10-CM | POA: Diagnosis not present

## 2016-07-27 DIAGNOSIS — D631 Anemia in chronic kidney disease: Secondary | ICD-10-CM | POA: Diagnosis not present

## 2016-07-27 DIAGNOSIS — I471 Supraventricular tachycardia: Secondary | ICD-10-CM | POA: Diagnosis not present

## 2016-07-27 DIAGNOSIS — I2510 Atherosclerotic heart disease of native coronary artery without angina pectoris: Secondary | ICD-10-CM | POA: Diagnosis not present

## 2016-07-27 DIAGNOSIS — I519 Heart disease, unspecified: Secondary | ICD-10-CM | POA: Diagnosis not present

## 2016-07-27 DIAGNOSIS — I1311 Hypertensive heart and chronic kidney disease without heart failure, with stage 5 chronic kidney disease, or end stage renal disease: Secondary | ICD-10-CM | POA: Diagnosis not present

## 2016-07-28 DIAGNOSIS — D631 Anemia in chronic kidney disease: Secondary | ICD-10-CM | POA: Diagnosis not present

## 2016-07-28 DIAGNOSIS — I519 Heart disease, unspecified: Secondary | ICD-10-CM | POA: Diagnosis not present

## 2016-07-28 DIAGNOSIS — I471 Supraventricular tachycardia: Secondary | ICD-10-CM | POA: Diagnosis not present

## 2016-07-28 DIAGNOSIS — N186 End stage renal disease: Secondary | ICD-10-CM | POA: Diagnosis not present

## 2016-07-28 DIAGNOSIS — I2510 Atherosclerotic heart disease of native coronary artery without angina pectoris: Secondary | ICD-10-CM | POA: Diagnosis not present

## 2016-07-28 DIAGNOSIS — I1311 Hypertensive heart and chronic kidney disease without heart failure, with stage 5 chronic kidney disease, or end stage renal disease: Secondary | ICD-10-CM | POA: Diagnosis not present

## 2016-07-29 DIAGNOSIS — I1311 Hypertensive heart and chronic kidney disease without heart failure, with stage 5 chronic kidney disease, or end stage renal disease: Secondary | ICD-10-CM | POA: Diagnosis not present

## 2016-07-29 DIAGNOSIS — I2510 Atherosclerotic heart disease of native coronary artery without angina pectoris: Secondary | ICD-10-CM | POA: Diagnosis not present

## 2016-07-29 DIAGNOSIS — N186 End stage renal disease: Secondary | ICD-10-CM | POA: Diagnosis not present

## 2016-07-29 DIAGNOSIS — I519 Heart disease, unspecified: Secondary | ICD-10-CM | POA: Diagnosis not present

## 2016-07-29 DIAGNOSIS — I471 Supraventricular tachycardia: Secondary | ICD-10-CM | POA: Diagnosis not present

## 2016-07-29 DIAGNOSIS — D631 Anemia in chronic kidney disease: Secondary | ICD-10-CM | POA: Diagnosis not present

## 2016-07-30 DIAGNOSIS — N186 End stage renal disease: Secondary | ICD-10-CM | POA: Diagnosis not present

## 2016-07-30 DIAGNOSIS — I471 Supraventricular tachycardia: Secondary | ICD-10-CM | POA: Diagnosis not present

## 2016-07-30 DIAGNOSIS — I1311 Hypertensive heart and chronic kidney disease without heart failure, with stage 5 chronic kidney disease, or end stage renal disease: Secondary | ICD-10-CM | POA: Diagnosis not present

## 2016-07-30 DIAGNOSIS — I519 Heart disease, unspecified: Secondary | ICD-10-CM | POA: Diagnosis not present

## 2016-07-30 DIAGNOSIS — I2510 Atherosclerotic heart disease of native coronary artery without angina pectoris: Secondary | ICD-10-CM | POA: Diagnosis not present

## 2016-07-30 DIAGNOSIS — D631 Anemia in chronic kidney disease: Secondary | ICD-10-CM | POA: Diagnosis not present

## 2016-07-31 DIAGNOSIS — I519 Heart disease, unspecified: Secondary | ICD-10-CM | POA: Diagnosis not present

## 2016-07-31 DIAGNOSIS — I471 Supraventricular tachycardia: Secondary | ICD-10-CM | POA: Diagnosis not present

## 2016-07-31 DIAGNOSIS — D631 Anemia in chronic kidney disease: Secondary | ICD-10-CM | POA: Diagnosis not present

## 2016-07-31 DIAGNOSIS — I1311 Hypertensive heart and chronic kidney disease without heart failure, with stage 5 chronic kidney disease, or end stage renal disease: Secondary | ICD-10-CM | POA: Diagnosis not present

## 2016-07-31 DIAGNOSIS — N186 End stage renal disease: Secondary | ICD-10-CM | POA: Diagnosis not present

## 2016-07-31 DIAGNOSIS — I2510 Atherosclerotic heart disease of native coronary artery without angina pectoris: Secondary | ICD-10-CM | POA: Diagnosis not present

## 2016-08-03 DIAGNOSIS — I471 Supraventricular tachycardia: Secondary | ICD-10-CM | POA: Diagnosis not present

## 2016-08-03 DIAGNOSIS — D631 Anemia in chronic kidney disease: Secondary | ICD-10-CM | POA: Diagnosis not present

## 2016-08-03 DIAGNOSIS — I1311 Hypertensive heart and chronic kidney disease without heart failure, with stage 5 chronic kidney disease, or end stage renal disease: Secondary | ICD-10-CM | POA: Diagnosis not present

## 2016-08-03 DIAGNOSIS — N186 End stage renal disease: Secondary | ICD-10-CM | POA: Diagnosis not present

## 2016-08-03 DIAGNOSIS — I519 Heart disease, unspecified: Secondary | ICD-10-CM | POA: Diagnosis not present

## 2016-08-03 DIAGNOSIS — I2510 Atherosclerotic heart disease of native coronary artery without angina pectoris: Secondary | ICD-10-CM | POA: Diagnosis not present

## 2016-08-04 DIAGNOSIS — I519 Heart disease, unspecified: Secondary | ICD-10-CM | POA: Diagnosis not present

## 2016-08-04 DIAGNOSIS — N186 End stage renal disease: Secondary | ICD-10-CM | POA: Diagnosis not present

## 2016-08-04 DIAGNOSIS — I1311 Hypertensive heart and chronic kidney disease without heart failure, with stage 5 chronic kidney disease, or end stage renal disease: Secondary | ICD-10-CM | POA: Diagnosis not present

## 2016-08-04 DIAGNOSIS — I471 Supraventricular tachycardia: Secondary | ICD-10-CM | POA: Diagnosis not present

## 2016-08-04 DIAGNOSIS — D631 Anemia in chronic kidney disease: Secondary | ICD-10-CM | POA: Diagnosis not present

## 2016-08-04 DIAGNOSIS — I2510 Atherosclerotic heart disease of native coronary artery without angina pectoris: Secondary | ICD-10-CM | POA: Diagnosis not present

## 2016-08-05 DIAGNOSIS — I2510 Atherosclerotic heart disease of native coronary artery without angina pectoris: Secondary | ICD-10-CM | POA: Diagnosis not present

## 2016-08-05 DIAGNOSIS — I519 Heart disease, unspecified: Secondary | ICD-10-CM | POA: Diagnosis not present

## 2016-08-05 DIAGNOSIS — I471 Supraventricular tachycardia: Secondary | ICD-10-CM | POA: Diagnosis not present

## 2016-08-05 DIAGNOSIS — N186 End stage renal disease: Secondary | ICD-10-CM | POA: Diagnosis not present

## 2016-08-05 DIAGNOSIS — D631 Anemia in chronic kidney disease: Secondary | ICD-10-CM | POA: Diagnosis not present

## 2016-08-05 DIAGNOSIS — I1311 Hypertensive heart and chronic kidney disease without heart failure, with stage 5 chronic kidney disease, or end stage renal disease: Secondary | ICD-10-CM | POA: Diagnosis not present

## 2016-08-06 DIAGNOSIS — N184 Chronic kidney disease, stage 4 (severe): Secondary | ICD-10-CM | POA: Diagnosis not present

## 2016-08-06 DIAGNOSIS — I2510 Atherosclerotic heart disease of native coronary artery without angina pectoris: Secondary | ICD-10-CM | POA: Diagnosis not present

## 2016-08-06 DIAGNOSIS — I480 Paroxysmal atrial fibrillation: Secondary | ICD-10-CM | POA: Diagnosis not present

## 2016-08-06 DIAGNOSIS — I1311 Hypertensive heart and chronic kidney disease without heart failure, with stage 5 chronic kidney disease, or end stage renal disease: Secondary | ICD-10-CM | POA: Diagnosis not present

## 2016-08-06 DIAGNOSIS — N186 End stage renal disease: Secondary | ICD-10-CM | POA: Diagnosis not present

## 2016-08-06 DIAGNOSIS — I471 Supraventricular tachycardia: Secondary | ICD-10-CM | POA: Diagnosis not present

## 2016-08-06 DIAGNOSIS — D631 Anemia in chronic kidney disease: Secondary | ICD-10-CM | POA: Diagnosis not present

## 2016-08-06 DIAGNOSIS — I519 Heart disease, unspecified: Secondary | ICD-10-CM | POA: Diagnosis not present

## 2016-08-06 DIAGNOSIS — I5189 Other ill-defined heart diseases: Secondary | ICD-10-CM | POA: Diagnosis not present

## 2016-08-06 DIAGNOSIS — I10 Essential (primary) hypertension: Secondary | ICD-10-CM | POA: Diagnosis not present

## 2016-08-07 DIAGNOSIS — N186 End stage renal disease: Secondary | ICD-10-CM | POA: Diagnosis not present

## 2016-08-07 DIAGNOSIS — I2510 Atherosclerotic heart disease of native coronary artery without angina pectoris: Secondary | ICD-10-CM | POA: Diagnosis not present

## 2016-08-07 DIAGNOSIS — I471 Supraventricular tachycardia: Secondary | ICD-10-CM | POA: Diagnosis not present

## 2016-08-07 DIAGNOSIS — I519 Heart disease, unspecified: Secondary | ICD-10-CM | POA: Diagnosis not present

## 2016-08-07 DIAGNOSIS — D631 Anemia in chronic kidney disease: Secondary | ICD-10-CM | POA: Diagnosis not present

## 2016-08-07 DIAGNOSIS — I1311 Hypertensive heart and chronic kidney disease without heart failure, with stage 5 chronic kidney disease, or end stage renal disease: Secondary | ICD-10-CM | POA: Diagnosis not present

## 2016-08-10 DIAGNOSIS — I2510 Atherosclerotic heart disease of native coronary artery without angina pectoris: Secondary | ICD-10-CM | POA: Diagnosis not present

## 2016-08-10 DIAGNOSIS — I519 Heart disease, unspecified: Secondary | ICD-10-CM | POA: Diagnosis not present

## 2016-08-10 DIAGNOSIS — I471 Supraventricular tachycardia: Secondary | ICD-10-CM | POA: Diagnosis not present

## 2016-08-10 DIAGNOSIS — N186 End stage renal disease: Secondary | ICD-10-CM | POA: Diagnosis not present

## 2016-08-10 DIAGNOSIS — D631 Anemia in chronic kidney disease: Secondary | ICD-10-CM | POA: Diagnosis not present

## 2016-08-10 DIAGNOSIS — I1311 Hypertensive heart and chronic kidney disease without heart failure, with stage 5 chronic kidney disease, or end stage renal disease: Secondary | ICD-10-CM | POA: Diagnosis not present

## 2016-08-11 DIAGNOSIS — N186 End stage renal disease: Secondary | ICD-10-CM | POA: Diagnosis not present

## 2016-08-11 DIAGNOSIS — I471 Supraventricular tachycardia: Secondary | ICD-10-CM | POA: Diagnosis not present

## 2016-08-11 DIAGNOSIS — I1311 Hypertensive heart and chronic kidney disease without heart failure, with stage 5 chronic kidney disease, or end stage renal disease: Secondary | ICD-10-CM | POA: Diagnosis not present

## 2016-08-11 DIAGNOSIS — I519 Heart disease, unspecified: Secondary | ICD-10-CM | POA: Diagnosis not present

## 2016-08-11 DIAGNOSIS — I2510 Atherosclerotic heart disease of native coronary artery without angina pectoris: Secondary | ICD-10-CM | POA: Diagnosis not present

## 2016-08-11 DIAGNOSIS — D631 Anemia in chronic kidney disease: Secondary | ICD-10-CM | POA: Diagnosis not present

## 2016-08-12 DIAGNOSIS — N186 End stage renal disease: Secondary | ICD-10-CM | POA: Diagnosis not present

## 2016-08-12 DIAGNOSIS — D631 Anemia in chronic kidney disease: Secondary | ICD-10-CM | POA: Diagnosis not present

## 2016-08-12 DIAGNOSIS — I1311 Hypertensive heart and chronic kidney disease without heart failure, with stage 5 chronic kidney disease, or end stage renal disease: Secondary | ICD-10-CM | POA: Diagnosis not present

## 2016-08-12 DIAGNOSIS — I519 Heart disease, unspecified: Secondary | ICD-10-CM | POA: Diagnosis not present

## 2016-08-12 DIAGNOSIS — I471 Supraventricular tachycardia: Secondary | ICD-10-CM | POA: Diagnosis not present

## 2016-08-12 DIAGNOSIS — I2510 Atherosclerotic heart disease of native coronary artery without angina pectoris: Secondary | ICD-10-CM | POA: Diagnosis not present

## 2016-08-13 DIAGNOSIS — N186 End stage renal disease: Secondary | ICD-10-CM | POA: Diagnosis not present

## 2016-08-13 DIAGNOSIS — D631 Anemia in chronic kidney disease: Secondary | ICD-10-CM | POA: Diagnosis not present

## 2016-08-13 DIAGNOSIS — I1311 Hypertensive heart and chronic kidney disease without heart failure, with stage 5 chronic kidney disease, or end stage renal disease: Secondary | ICD-10-CM | POA: Diagnosis not present

## 2016-08-13 DIAGNOSIS — I471 Supraventricular tachycardia: Secondary | ICD-10-CM | POA: Diagnosis not present

## 2016-08-13 DIAGNOSIS — I2510 Atherosclerotic heart disease of native coronary artery without angina pectoris: Secondary | ICD-10-CM | POA: Diagnosis not present

## 2016-08-13 DIAGNOSIS — I519 Heart disease, unspecified: Secondary | ICD-10-CM | POA: Diagnosis not present

## 2016-08-14 DIAGNOSIS — I519 Heart disease, unspecified: Secondary | ICD-10-CM | POA: Diagnosis not present

## 2016-08-14 DIAGNOSIS — I1311 Hypertensive heart and chronic kidney disease without heart failure, with stage 5 chronic kidney disease, or end stage renal disease: Secondary | ICD-10-CM | POA: Diagnosis not present

## 2016-08-14 DIAGNOSIS — N186 End stage renal disease: Secondary | ICD-10-CM | POA: Diagnosis not present

## 2016-08-14 DIAGNOSIS — D631 Anemia in chronic kidney disease: Secondary | ICD-10-CM | POA: Diagnosis not present

## 2016-08-14 DIAGNOSIS — I471 Supraventricular tachycardia: Secondary | ICD-10-CM | POA: Diagnosis not present

## 2016-08-14 DIAGNOSIS — I2510 Atherosclerotic heart disease of native coronary artery without angina pectoris: Secondary | ICD-10-CM | POA: Diagnosis not present

## 2016-08-17 DIAGNOSIS — D631 Anemia in chronic kidney disease: Secondary | ICD-10-CM | POA: Diagnosis not present

## 2016-08-17 DIAGNOSIS — I471 Supraventricular tachycardia: Secondary | ICD-10-CM | POA: Diagnosis not present

## 2016-08-17 DIAGNOSIS — N186 End stage renal disease: Secondary | ICD-10-CM | POA: Diagnosis not present

## 2016-08-17 DIAGNOSIS — I519 Heart disease, unspecified: Secondary | ICD-10-CM | POA: Diagnosis not present

## 2016-08-17 DIAGNOSIS — I1311 Hypertensive heart and chronic kidney disease without heart failure, with stage 5 chronic kidney disease, or end stage renal disease: Secondary | ICD-10-CM | POA: Diagnosis not present

## 2016-08-17 DIAGNOSIS — I2510 Atherosclerotic heart disease of native coronary artery without angina pectoris: Secondary | ICD-10-CM | POA: Diagnosis not present

## 2016-08-18 DIAGNOSIS — N186 End stage renal disease: Secondary | ICD-10-CM | POA: Diagnosis not present

## 2016-08-18 DIAGNOSIS — D631 Anemia in chronic kidney disease: Secondary | ICD-10-CM | POA: Diagnosis not present

## 2016-08-18 DIAGNOSIS — I2510 Atherosclerotic heart disease of native coronary artery without angina pectoris: Secondary | ICD-10-CM | POA: Diagnosis not present

## 2016-08-18 DIAGNOSIS — I519 Heart disease, unspecified: Secondary | ICD-10-CM | POA: Diagnosis not present

## 2016-08-18 DIAGNOSIS — I471 Supraventricular tachycardia: Secondary | ICD-10-CM | POA: Diagnosis not present

## 2016-08-18 DIAGNOSIS — I1311 Hypertensive heart and chronic kidney disease without heart failure, with stage 5 chronic kidney disease, or end stage renal disease: Secondary | ICD-10-CM | POA: Diagnosis not present

## 2016-08-19 DIAGNOSIS — I1311 Hypertensive heart and chronic kidney disease without heart failure, with stage 5 chronic kidney disease, or end stage renal disease: Secondary | ICD-10-CM | POA: Diagnosis not present

## 2016-08-19 DIAGNOSIS — I519 Heart disease, unspecified: Secondary | ICD-10-CM | POA: Diagnosis not present

## 2016-08-19 DIAGNOSIS — N186 End stage renal disease: Secondary | ICD-10-CM | POA: Diagnosis not present

## 2016-08-19 DIAGNOSIS — D631 Anemia in chronic kidney disease: Secondary | ICD-10-CM | POA: Diagnosis not present

## 2016-08-19 DIAGNOSIS — I471 Supraventricular tachycardia: Secondary | ICD-10-CM | POA: Diagnosis not present

## 2016-08-19 DIAGNOSIS — I2510 Atherosclerotic heart disease of native coronary artery without angina pectoris: Secondary | ICD-10-CM | POA: Diagnosis not present

## 2016-08-20 DIAGNOSIS — E785 Hyperlipidemia, unspecified: Secondary | ICD-10-CM | POA: Diagnosis not present

## 2016-08-20 DIAGNOSIS — E039 Hypothyroidism, unspecified: Secondary | ICD-10-CM | POA: Diagnosis not present

## 2016-08-20 DIAGNOSIS — E559 Vitamin D deficiency, unspecified: Secondary | ICD-10-CM | POA: Diagnosis not present

## 2016-08-20 DIAGNOSIS — F329 Major depressive disorder, single episode, unspecified: Secondary | ICD-10-CM | POA: Diagnosis not present

## 2016-08-20 DIAGNOSIS — I340 Nonrheumatic mitral (valve) insufficiency: Secondary | ICD-10-CM | POA: Diagnosis not present

## 2016-08-20 DIAGNOSIS — N186 End stage renal disease: Secondary | ICD-10-CM | POA: Diagnosis not present

## 2016-08-20 DIAGNOSIS — M109 Gout, unspecified: Secondary | ICD-10-CM | POA: Diagnosis not present

## 2016-08-20 DIAGNOSIS — I2510 Atherosclerotic heart disease of native coronary artery without angina pectoris: Secondary | ICD-10-CM | POA: Diagnosis not present

## 2016-08-20 DIAGNOSIS — I480 Paroxysmal atrial fibrillation: Secondary | ICD-10-CM | POA: Diagnosis not present

## 2016-08-20 DIAGNOSIS — S41112D Laceration without foreign body of left upper arm, subsequent encounter: Secondary | ICD-10-CM | POA: Diagnosis not present

## 2016-08-20 DIAGNOSIS — I361 Nonrheumatic tricuspid (valve) insufficiency: Secondary | ICD-10-CM | POA: Diagnosis not present

## 2016-08-20 DIAGNOSIS — I519 Heart disease, unspecified: Secondary | ICD-10-CM | POA: Diagnosis not present

## 2016-08-20 DIAGNOSIS — I1311 Hypertensive heart and chronic kidney disease without heart failure, with stage 5 chronic kidney disease, or end stage renal disease: Secondary | ICD-10-CM | POA: Diagnosis not present

## 2016-08-20 DIAGNOSIS — S81812D Laceration without foreign body, left lower leg, subsequent encounter: Secondary | ICD-10-CM | POA: Diagnosis not present

## 2016-08-20 DIAGNOSIS — Z9181 History of falling: Secondary | ICD-10-CM | POA: Diagnosis not present

## 2016-08-20 DIAGNOSIS — Z7982 Long term (current) use of aspirin: Secondary | ICD-10-CM | POA: Diagnosis not present

## 2016-08-20 DIAGNOSIS — D631 Anemia in chronic kidney disease: Secondary | ICD-10-CM | POA: Diagnosis not present

## 2016-08-20 DIAGNOSIS — I471 Supraventricular tachycardia: Secondary | ICD-10-CM | POA: Diagnosis not present

## 2016-08-21 DIAGNOSIS — I471 Supraventricular tachycardia: Secondary | ICD-10-CM | POA: Diagnosis not present

## 2016-08-21 DIAGNOSIS — N186 End stage renal disease: Secondary | ICD-10-CM | POA: Diagnosis not present

## 2016-08-21 DIAGNOSIS — D631 Anemia in chronic kidney disease: Secondary | ICD-10-CM | POA: Diagnosis not present

## 2016-08-21 DIAGNOSIS — I1311 Hypertensive heart and chronic kidney disease without heart failure, with stage 5 chronic kidney disease, or end stage renal disease: Secondary | ICD-10-CM | POA: Diagnosis not present

## 2016-08-21 DIAGNOSIS — I519 Heart disease, unspecified: Secondary | ICD-10-CM | POA: Diagnosis not present

## 2016-08-21 DIAGNOSIS — I2510 Atherosclerotic heart disease of native coronary artery without angina pectoris: Secondary | ICD-10-CM | POA: Diagnosis not present

## 2016-08-24 DIAGNOSIS — N186 End stage renal disease: Secondary | ICD-10-CM | POA: Diagnosis not present

## 2016-08-24 DIAGNOSIS — I471 Supraventricular tachycardia: Secondary | ICD-10-CM | POA: Diagnosis not present

## 2016-08-24 DIAGNOSIS — I1311 Hypertensive heart and chronic kidney disease without heart failure, with stage 5 chronic kidney disease, or end stage renal disease: Secondary | ICD-10-CM | POA: Diagnosis not present

## 2016-08-24 DIAGNOSIS — D631 Anemia in chronic kidney disease: Secondary | ICD-10-CM | POA: Diagnosis not present

## 2016-08-24 DIAGNOSIS — I2510 Atherosclerotic heart disease of native coronary artery without angina pectoris: Secondary | ICD-10-CM | POA: Diagnosis not present

## 2016-08-24 DIAGNOSIS — I519 Heart disease, unspecified: Secondary | ICD-10-CM | POA: Diagnosis not present

## 2016-08-25 DIAGNOSIS — N186 End stage renal disease: Secondary | ICD-10-CM | POA: Diagnosis not present

## 2016-08-25 DIAGNOSIS — I519 Heart disease, unspecified: Secondary | ICD-10-CM | POA: Diagnosis not present

## 2016-08-25 DIAGNOSIS — I471 Supraventricular tachycardia: Secondary | ICD-10-CM | POA: Diagnosis not present

## 2016-08-25 DIAGNOSIS — I2510 Atherosclerotic heart disease of native coronary artery without angina pectoris: Secondary | ICD-10-CM | POA: Diagnosis not present

## 2016-08-25 DIAGNOSIS — I1311 Hypertensive heart and chronic kidney disease without heart failure, with stage 5 chronic kidney disease, or end stage renal disease: Secondary | ICD-10-CM | POA: Diagnosis not present

## 2016-08-25 DIAGNOSIS — D631 Anemia in chronic kidney disease: Secondary | ICD-10-CM | POA: Diagnosis not present

## 2016-08-26 DIAGNOSIS — I519 Heart disease, unspecified: Secondary | ICD-10-CM | POA: Diagnosis not present

## 2016-08-26 DIAGNOSIS — I1311 Hypertensive heart and chronic kidney disease without heart failure, with stage 5 chronic kidney disease, or end stage renal disease: Secondary | ICD-10-CM | POA: Diagnosis not present

## 2016-08-26 DIAGNOSIS — I2510 Atherosclerotic heart disease of native coronary artery without angina pectoris: Secondary | ICD-10-CM | POA: Diagnosis not present

## 2016-08-26 DIAGNOSIS — N186 End stage renal disease: Secondary | ICD-10-CM | POA: Diagnosis not present

## 2016-08-26 DIAGNOSIS — I471 Supraventricular tachycardia: Secondary | ICD-10-CM | POA: Diagnosis not present

## 2016-08-26 DIAGNOSIS — D631 Anemia in chronic kidney disease: Secondary | ICD-10-CM | POA: Diagnosis not present

## 2016-08-27 DIAGNOSIS — I1311 Hypertensive heart and chronic kidney disease without heart failure, with stage 5 chronic kidney disease, or end stage renal disease: Secondary | ICD-10-CM | POA: Diagnosis not present

## 2016-08-27 DIAGNOSIS — I2510 Atherosclerotic heart disease of native coronary artery without angina pectoris: Secondary | ICD-10-CM | POA: Diagnosis not present

## 2016-08-27 DIAGNOSIS — D631 Anemia in chronic kidney disease: Secondary | ICD-10-CM | POA: Diagnosis not present

## 2016-08-27 DIAGNOSIS — I519 Heart disease, unspecified: Secondary | ICD-10-CM | POA: Diagnosis not present

## 2016-08-27 DIAGNOSIS — N186 End stage renal disease: Secondary | ICD-10-CM | POA: Diagnosis not present

## 2016-08-27 DIAGNOSIS — I471 Supraventricular tachycardia: Secondary | ICD-10-CM | POA: Diagnosis not present

## 2016-08-28 DIAGNOSIS — D631 Anemia in chronic kidney disease: Secondary | ICD-10-CM | POA: Diagnosis not present

## 2016-08-28 DIAGNOSIS — I1311 Hypertensive heart and chronic kidney disease without heart failure, with stage 5 chronic kidney disease, or end stage renal disease: Secondary | ICD-10-CM | POA: Diagnosis not present

## 2016-08-28 DIAGNOSIS — I519 Heart disease, unspecified: Secondary | ICD-10-CM | POA: Diagnosis not present

## 2016-08-28 DIAGNOSIS — I2510 Atherosclerotic heart disease of native coronary artery without angina pectoris: Secondary | ICD-10-CM | POA: Diagnosis not present

## 2016-08-28 DIAGNOSIS — I471 Supraventricular tachycardia: Secondary | ICD-10-CM | POA: Diagnosis not present

## 2016-08-28 DIAGNOSIS — N186 End stage renal disease: Secondary | ICD-10-CM | POA: Diagnosis not present

## 2016-08-31 DIAGNOSIS — D631 Anemia in chronic kidney disease: Secondary | ICD-10-CM | POA: Diagnosis not present

## 2016-08-31 DIAGNOSIS — I2510 Atherosclerotic heart disease of native coronary artery without angina pectoris: Secondary | ICD-10-CM | POA: Diagnosis not present

## 2016-08-31 DIAGNOSIS — I1311 Hypertensive heart and chronic kidney disease without heart failure, with stage 5 chronic kidney disease, or end stage renal disease: Secondary | ICD-10-CM | POA: Diagnosis not present

## 2016-08-31 DIAGNOSIS — I471 Supraventricular tachycardia: Secondary | ICD-10-CM | POA: Diagnosis not present

## 2016-08-31 DIAGNOSIS — I519 Heart disease, unspecified: Secondary | ICD-10-CM | POA: Diagnosis not present

## 2016-08-31 DIAGNOSIS — N186 End stage renal disease: Secondary | ICD-10-CM | POA: Diagnosis not present

## 2016-09-01 DIAGNOSIS — I471 Supraventricular tachycardia: Secondary | ICD-10-CM | POA: Diagnosis not present

## 2016-09-01 DIAGNOSIS — I519 Heart disease, unspecified: Secondary | ICD-10-CM | POA: Diagnosis not present

## 2016-09-01 DIAGNOSIS — I1311 Hypertensive heart and chronic kidney disease without heart failure, with stage 5 chronic kidney disease, or end stage renal disease: Secondary | ICD-10-CM | POA: Diagnosis not present

## 2016-09-01 DIAGNOSIS — N186 End stage renal disease: Secondary | ICD-10-CM | POA: Diagnosis not present

## 2016-09-01 DIAGNOSIS — D631 Anemia in chronic kidney disease: Secondary | ICD-10-CM | POA: Diagnosis not present

## 2016-09-01 DIAGNOSIS — I2510 Atherosclerotic heart disease of native coronary artery without angina pectoris: Secondary | ICD-10-CM | POA: Diagnosis not present

## 2016-09-02 DIAGNOSIS — I519 Heart disease, unspecified: Secondary | ICD-10-CM | POA: Diagnosis not present

## 2016-09-02 DIAGNOSIS — I471 Supraventricular tachycardia: Secondary | ICD-10-CM | POA: Diagnosis not present

## 2016-09-02 DIAGNOSIS — I1311 Hypertensive heart and chronic kidney disease without heart failure, with stage 5 chronic kidney disease, or end stage renal disease: Secondary | ICD-10-CM | POA: Diagnosis not present

## 2016-09-02 DIAGNOSIS — N186 End stage renal disease: Secondary | ICD-10-CM | POA: Diagnosis not present

## 2016-09-02 DIAGNOSIS — I2510 Atherosclerotic heart disease of native coronary artery without angina pectoris: Secondary | ICD-10-CM | POA: Diagnosis not present

## 2016-09-02 DIAGNOSIS — D631 Anemia in chronic kidney disease: Secondary | ICD-10-CM | POA: Diagnosis not present

## 2016-09-03 DIAGNOSIS — N186 End stage renal disease: Secondary | ICD-10-CM | POA: Diagnosis not present

## 2016-09-03 DIAGNOSIS — I2510 Atherosclerotic heart disease of native coronary artery without angina pectoris: Secondary | ICD-10-CM | POA: Diagnosis not present

## 2016-09-03 DIAGNOSIS — I1311 Hypertensive heart and chronic kidney disease without heart failure, with stage 5 chronic kidney disease, or end stage renal disease: Secondary | ICD-10-CM | POA: Diagnosis not present

## 2016-09-03 DIAGNOSIS — D631 Anemia in chronic kidney disease: Secondary | ICD-10-CM | POA: Diagnosis not present

## 2016-09-03 DIAGNOSIS — I519 Heart disease, unspecified: Secondary | ICD-10-CM | POA: Diagnosis not present

## 2016-09-03 DIAGNOSIS — I471 Supraventricular tachycardia: Secondary | ICD-10-CM | POA: Diagnosis not present

## 2016-09-04 DIAGNOSIS — D631 Anemia in chronic kidney disease: Secondary | ICD-10-CM | POA: Diagnosis not present

## 2016-09-04 DIAGNOSIS — N186 End stage renal disease: Secondary | ICD-10-CM | POA: Diagnosis not present

## 2016-09-04 DIAGNOSIS — I519 Heart disease, unspecified: Secondary | ICD-10-CM | POA: Diagnosis not present

## 2016-09-04 DIAGNOSIS — I1311 Hypertensive heart and chronic kidney disease without heart failure, with stage 5 chronic kidney disease, or end stage renal disease: Secondary | ICD-10-CM | POA: Diagnosis not present

## 2016-09-04 DIAGNOSIS — I2510 Atherosclerotic heart disease of native coronary artery without angina pectoris: Secondary | ICD-10-CM | POA: Diagnosis not present

## 2016-09-04 DIAGNOSIS — I471 Supraventricular tachycardia: Secondary | ICD-10-CM | POA: Diagnosis not present

## 2016-09-07 DIAGNOSIS — I2510 Atherosclerotic heart disease of native coronary artery without angina pectoris: Secondary | ICD-10-CM | POA: Diagnosis not present

## 2016-09-07 DIAGNOSIS — I471 Supraventricular tachycardia: Secondary | ICD-10-CM | POA: Diagnosis not present

## 2016-09-07 DIAGNOSIS — D631 Anemia in chronic kidney disease: Secondary | ICD-10-CM | POA: Diagnosis not present

## 2016-09-07 DIAGNOSIS — N186 End stage renal disease: Secondary | ICD-10-CM | POA: Diagnosis not present

## 2016-09-07 DIAGNOSIS — I519 Heart disease, unspecified: Secondary | ICD-10-CM | POA: Diagnosis not present

## 2016-09-07 DIAGNOSIS — I1311 Hypertensive heart and chronic kidney disease without heart failure, with stage 5 chronic kidney disease, or end stage renal disease: Secondary | ICD-10-CM | POA: Diagnosis not present

## 2016-09-08 DIAGNOSIS — I2510 Atherosclerotic heart disease of native coronary artery without angina pectoris: Secondary | ICD-10-CM | POA: Diagnosis not present

## 2016-09-08 DIAGNOSIS — I519 Heart disease, unspecified: Secondary | ICD-10-CM | POA: Diagnosis not present

## 2016-09-08 DIAGNOSIS — N186 End stage renal disease: Secondary | ICD-10-CM | POA: Diagnosis not present

## 2016-09-08 DIAGNOSIS — I471 Supraventricular tachycardia: Secondary | ICD-10-CM | POA: Diagnosis not present

## 2016-09-08 DIAGNOSIS — I1311 Hypertensive heart and chronic kidney disease without heart failure, with stage 5 chronic kidney disease, or end stage renal disease: Secondary | ICD-10-CM | POA: Diagnosis not present

## 2016-09-08 DIAGNOSIS — D631 Anemia in chronic kidney disease: Secondary | ICD-10-CM | POA: Diagnosis not present

## 2016-09-09 DIAGNOSIS — I1311 Hypertensive heart and chronic kidney disease without heart failure, with stage 5 chronic kidney disease, or end stage renal disease: Secondary | ICD-10-CM | POA: Diagnosis not present

## 2016-09-09 DIAGNOSIS — I519 Heart disease, unspecified: Secondary | ICD-10-CM | POA: Diagnosis not present

## 2016-09-09 DIAGNOSIS — D631 Anemia in chronic kidney disease: Secondary | ICD-10-CM | POA: Diagnosis not present

## 2016-09-09 DIAGNOSIS — I2510 Atherosclerotic heart disease of native coronary artery without angina pectoris: Secondary | ICD-10-CM | POA: Diagnosis not present

## 2016-09-09 DIAGNOSIS — I471 Supraventricular tachycardia: Secondary | ICD-10-CM | POA: Diagnosis not present

## 2016-09-09 DIAGNOSIS — N186 End stage renal disease: Secondary | ICD-10-CM | POA: Diagnosis not present

## 2016-09-10 DIAGNOSIS — I2510 Atherosclerotic heart disease of native coronary artery without angina pectoris: Secondary | ICD-10-CM | POA: Diagnosis not present

## 2016-09-10 DIAGNOSIS — D631 Anemia in chronic kidney disease: Secondary | ICD-10-CM | POA: Diagnosis not present

## 2016-09-10 DIAGNOSIS — N186 End stage renal disease: Secondary | ICD-10-CM | POA: Diagnosis not present

## 2016-09-10 DIAGNOSIS — I471 Supraventricular tachycardia: Secondary | ICD-10-CM | POA: Diagnosis not present

## 2016-09-10 DIAGNOSIS — I519 Heart disease, unspecified: Secondary | ICD-10-CM | POA: Diagnosis not present

## 2016-09-10 DIAGNOSIS — I1311 Hypertensive heart and chronic kidney disease without heart failure, with stage 5 chronic kidney disease, or end stage renal disease: Secondary | ICD-10-CM | POA: Diagnosis not present

## 2016-09-11 DIAGNOSIS — N186 End stage renal disease: Secondary | ICD-10-CM | POA: Diagnosis not present

## 2016-09-11 DIAGNOSIS — I471 Supraventricular tachycardia: Secondary | ICD-10-CM | POA: Diagnosis not present

## 2016-09-11 DIAGNOSIS — I2510 Atherosclerotic heart disease of native coronary artery without angina pectoris: Secondary | ICD-10-CM | POA: Diagnosis not present

## 2016-09-11 DIAGNOSIS — I519 Heart disease, unspecified: Secondary | ICD-10-CM | POA: Diagnosis not present

## 2016-09-11 DIAGNOSIS — I1311 Hypertensive heart and chronic kidney disease without heart failure, with stage 5 chronic kidney disease, or end stage renal disease: Secondary | ICD-10-CM | POA: Diagnosis not present

## 2016-09-11 DIAGNOSIS — D631 Anemia in chronic kidney disease: Secondary | ICD-10-CM | POA: Diagnosis not present

## 2016-09-14 DIAGNOSIS — D631 Anemia in chronic kidney disease: Secondary | ICD-10-CM | POA: Diagnosis not present

## 2016-09-14 DIAGNOSIS — I471 Supraventricular tachycardia: Secondary | ICD-10-CM | POA: Diagnosis not present

## 2016-09-14 DIAGNOSIS — I2510 Atherosclerotic heart disease of native coronary artery without angina pectoris: Secondary | ICD-10-CM | POA: Diagnosis not present

## 2016-09-14 DIAGNOSIS — N186 End stage renal disease: Secondary | ICD-10-CM | POA: Diagnosis not present

## 2016-09-14 DIAGNOSIS — I519 Heart disease, unspecified: Secondary | ICD-10-CM | POA: Diagnosis not present

## 2016-09-14 DIAGNOSIS — I1311 Hypertensive heart and chronic kidney disease without heart failure, with stage 5 chronic kidney disease, or end stage renal disease: Secondary | ICD-10-CM | POA: Diagnosis not present

## 2016-09-15 DIAGNOSIS — I1311 Hypertensive heart and chronic kidney disease without heart failure, with stage 5 chronic kidney disease, or end stage renal disease: Secondary | ICD-10-CM | POA: Diagnosis not present

## 2016-09-15 DIAGNOSIS — I2510 Atherosclerotic heart disease of native coronary artery without angina pectoris: Secondary | ICD-10-CM | POA: Diagnosis not present

## 2016-09-15 DIAGNOSIS — D631 Anemia in chronic kidney disease: Secondary | ICD-10-CM | POA: Diagnosis not present

## 2016-09-15 DIAGNOSIS — I519 Heart disease, unspecified: Secondary | ICD-10-CM | POA: Diagnosis not present

## 2016-09-15 DIAGNOSIS — N186 End stage renal disease: Secondary | ICD-10-CM | POA: Diagnosis not present

## 2016-09-15 DIAGNOSIS — I471 Supraventricular tachycardia: Secondary | ICD-10-CM | POA: Diagnosis not present

## 2016-09-16 DIAGNOSIS — D631 Anemia in chronic kidney disease: Secondary | ICD-10-CM | POA: Diagnosis not present

## 2016-09-16 DIAGNOSIS — I1311 Hypertensive heart and chronic kidney disease without heart failure, with stage 5 chronic kidney disease, or end stage renal disease: Secondary | ICD-10-CM | POA: Diagnosis not present

## 2016-09-16 DIAGNOSIS — N186 End stage renal disease: Secondary | ICD-10-CM | POA: Diagnosis not present

## 2016-09-16 DIAGNOSIS — I2510 Atherosclerotic heart disease of native coronary artery without angina pectoris: Secondary | ICD-10-CM | POA: Diagnosis not present

## 2016-09-16 DIAGNOSIS — I471 Supraventricular tachycardia: Secondary | ICD-10-CM | POA: Diagnosis not present

## 2016-09-16 DIAGNOSIS — I519 Heart disease, unspecified: Secondary | ICD-10-CM | POA: Diagnosis not present

## 2016-09-17 DIAGNOSIS — N186 End stage renal disease: Secondary | ICD-10-CM | POA: Diagnosis not present

## 2016-09-17 DIAGNOSIS — I2510 Atherosclerotic heart disease of native coronary artery without angina pectoris: Secondary | ICD-10-CM | POA: Diagnosis not present

## 2016-09-17 DIAGNOSIS — I471 Supraventricular tachycardia: Secondary | ICD-10-CM | POA: Diagnosis not present

## 2016-09-17 DIAGNOSIS — D631 Anemia in chronic kidney disease: Secondary | ICD-10-CM | POA: Diagnosis not present

## 2016-09-17 DIAGNOSIS — I1311 Hypertensive heart and chronic kidney disease without heart failure, with stage 5 chronic kidney disease, or end stage renal disease: Secondary | ICD-10-CM | POA: Diagnosis not present

## 2016-09-17 DIAGNOSIS — I519 Heart disease, unspecified: Secondary | ICD-10-CM | POA: Diagnosis not present

## 2016-09-18 DIAGNOSIS — I1311 Hypertensive heart and chronic kidney disease without heart failure, with stage 5 chronic kidney disease, or end stage renal disease: Secondary | ICD-10-CM | POA: Diagnosis not present

## 2016-09-18 DIAGNOSIS — I471 Supraventricular tachycardia: Secondary | ICD-10-CM | POA: Diagnosis not present

## 2016-09-18 DIAGNOSIS — D631 Anemia in chronic kidney disease: Secondary | ICD-10-CM | POA: Diagnosis not present

## 2016-09-18 DIAGNOSIS — I2510 Atherosclerotic heart disease of native coronary artery without angina pectoris: Secondary | ICD-10-CM | POA: Diagnosis not present

## 2016-09-18 DIAGNOSIS — I519 Heart disease, unspecified: Secondary | ICD-10-CM | POA: Diagnosis not present

## 2016-09-18 DIAGNOSIS — N186 End stage renal disease: Secondary | ICD-10-CM | POA: Diagnosis not present

## 2016-09-20 DIAGNOSIS — M109 Gout, unspecified: Secondary | ICD-10-CM | POA: Diagnosis not present

## 2016-09-20 DIAGNOSIS — S41112D Laceration without foreign body of left upper arm, subsequent encounter: Secondary | ICD-10-CM | POA: Diagnosis not present

## 2016-09-20 DIAGNOSIS — N186 End stage renal disease: Secondary | ICD-10-CM | POA: Diagnosis not present

## 2016-09-20 DIAGNOSIS — Z9181 History of falling: Secondary | ICD-10-CM | POA: Diagnosis not present

## 2016-09-20 DIAGNOSIS — I1311 Hypertensive heart and chronic kidney disease without heart failure, with stage 5 chronic kidney disease, or end stage renal disease: Secondary | ICD-10-CM | POA: Diagnosis not present

## 2016-09-20 DIAGNOSIS — E039 Hypothyroidism, unspecified: Secondary | ICD-10-CM | POA: Diagnosis not present

## 2016-09-20 DIAGNOSIS — I361 Nonrheumatic tricuspid (valve) insufficiency: Secondary | ICD-10-CM | POA: Diagnosis not present

## 2016-09-20 DIAGNOSIS — I2510 Atherosclerotic heart disease of native coronary artery without angina pectoris: Secondary | ICD-10-CM | POA: Diagnosis not present

## 2016-09-20 DIAGNOSIS — D631 Anemia in chronic kidney disease: Secondary | ICD-10-CM | POA: Diagnosis not present

## 2016-09-20 DIAGNOSIS — I480 Paroxysmal atrial fibrillation: Secondary | ICD-10-CM | POA: Diagnosis not present

## 2016-09-20 DIAGNOSIS — E785 Hyperlipidemia, unspecified: Secondary | ICD-10-CM | POA: Diagnosis not present

## 2016-09-20 DIAGNOSIS — Z7982 Long term (current) use of aspirin: Secondary | ICD-10-CM | POA: Diagnosis not present

## 2016-09-20 DIAGNOSIS — F329 Major depressive disorder, single episode, unspecified: Secondary | ICD-10-CM | POA: Diagnosis not present

## 2016-09-20 DIAGNOSIS — I471 Supraventricular tachycardia: Secondary | ICD-10-CM | POA: Diagnosis not present

## 2016-09-20 DIAGNOSIS — I519 Heart disease, unspecified: Secondary | ICD-10-CM | POA: Diagnosis not present

## 2016-09-20 DIAGNOSIS — S81812D Laceration without foreign body, left lower leg, subsequent encounter: Secondary | ICD-10-CM | POA: Diagnosis not present

## 2016-09-20 DIAGNOSIS — I340 Nonrheumatic mitral (valve) insufficiency: Secondary | ICD-10-CM | POA: Diagnosis not present

## 2016-09-20 DIAGNOSIS — E559 Vitamin D deficiency, unspecified: Secondary | ICD-10-CM | POA: Diagnosis not present

## 2016-09-21 DIAGNOSIS — D631 Anemia in chronic kidney disease: Secondary | ICD-10-CM | POA: Diagnosis not present

## 2016-09-21 DIAGNOSIS — I1311 Hypertensive heart and chronic kidney disease without heart failure, with stage 5 chronic kidney disease, or end stage renal disease: Secondary | ICD-10-CM | POA: Diagnosis not present

## 2016-09-21 DIAGNOSIS — I2510 Atherosclerotic heart disease of native coronary artery without angina pectoris: Secondary | ICD-10-CM | POA: Diagnosis not present

## 2016-09-21 DIAGNOSIS — I519 Heart disease, unspecified: Secondary | ICD-10-CM | POA: Diagnosis not present

## 2016-09-21 DIAGNOSIS — I471 Supraventricular tachycardia: Secondary | ICD-10-CM | POA: Diagnosis not present

## 2016-09-21 DIAGNOSIS — N186 End stage renal disease: Secondary | ICD-10-CM | POA: Diagnosis not present

## 2016-09-22 DIAGNOSIS — I471 Supraventricular tachycardia: Secondary | ICD-10-CM | POA: Diagnosis not present

## 2016-09-22 DIAGNOSIS — I2510 Atherosclerotic heart disease of native coronary artery without angina pectoris: Secondary | ICD-10-CM | POA: Diagnosis not present

## 2016-09-22 DIAGNOSIS — N186 End stage renal disease: Secondary | ICD-10-CM | POA: Diagnosis not present

## 2016-09-22 DIAGNOSIS — I1311 Hypertensive heart and chronic kidney disease without heart failure, with stage 5 chronic kidney disease, or end stage renal disease: Secondary | ICD-10-CM | POA: Diagnosis not present

## 2016-09-22 DIAGNOSIS — I519 Heart disease, unspecified: Secondary | ICD-10-CM | POA: Diagnosis not present

## 2016-09-22 DIAGNOSIS — D631 Anemia in chronic kidney disease: Secondary | ICD-10-CM | POA: Diagnosis not present

## 2016-09-23 DIAGNOSIS — I519 Heart disease, unspecified: Secondary | ICD-10-CM | POA: Diagnosis not present

## 2016-09-23 DIAGNOSIS — D631 Anemia in chronic kidney disease: Secondary | ICD-10-CM | POA: Diagnosis not present

## 2016-09-23 DIAGNOSIS — I1311 Hypertensive heart and chronic kidney disease without heart failure, with stage 5 chronic kidney disease, or end stage renal disease: Secondary | ICD-10-CM | POA: Diagnosis not present

## 2016-09-23 DIAGNOSIS — I2510 Atherosclerotic heart disease of native coronary artery without angina pectoris: Secondary | ICD-10-CM | POA: Diagnosis not present

## 2016-09-23 DIAGNOSIS — N186 End stage renal disease: Secondary | ICD-10-CM | POA: Diagnosis not present

## 2016-09-23 DIAGNOSIS — I471 Supraventricular tachycardia: Secondary | ICD-10-CM | POA: Diagnosis not present

## 2016-09-24 DIAGNOSIS — I471 Supraventricular tachycardia: Secondary | ICD-10-CM | POA: Diagnosis not present

## 2016-09-24 DIAGNOSIS — I1311 Hypertensive heart and chronic kidney disease without heart failure, with stage 5 chronic kidney disease, or end stage renal disease: Secondary | ICD-10-CM | POA: Diagnosis not present

## 2016-09-24 DIAGNOSIS — N184 Chronic kidney disease, stage 4 (severe): Secondary | ICD-10-CM | POA: Diagnosis not present

## 2016-09-24 DIAGNOSIS — I5189 Other ill-defined heart diseases: Secondary | ICD-10-CM | POA: Diagnosis not present

## 2016-09-24 DIAGNOSIS — N186 End stage renal disease: Secondary | ICD-10-CM | POA: Diagnosis not present

## 2016-09-24 DIAGNOSIS — I519 Heart disease, unspecified: Secondary | ICD-10-CM | POA: Diagnosis not present

## 2016-09-24 DIAGNOSIS — I2510 Atherosclerotic heart disease of native coronary artery without angina pectoris: Secondary | ICD-10-CM | POA: Diagnosis not present

## 2016-09-24 DIAGNOSIS — D631 Anemia in chronic kidney disease: Secondary | ICD-10-CM | POA: Diagnosis not present

## 2016-09-24 DIAGNOSIS — I10 Essential (primary) hypertension: Secondary | ICD-10-CM | POA: Diagnosis not present

## 2016-09-24 DIAGNOSIS — I480 Paroxysmal atrial fibrillation: Secondary | ICD-10-CM | POA: Diagnosis not present

## 2016-09-25 DIAGNOSIS — N186 End stage renal disease: Secondary | ICD-10-CM | POA: Diagnosis not present

## 2016-09-25 DIAGNOSIS — I519 Heart disease, unspecified: Secondary | ICD-10-CM | POA: Diagnosis not present

## 2016-09-25 DIAGNOSIS — I2510 Atherosclerotic heart disease of native coronary artery without angina pectoris: Secondary | ICD-10-CM | POA: Diagnosis not present

## 2016-09-25 DIAGNOSIS — I471 Supraventricular tachycardia: Secondary | ICD-10-CM | POA: Diagnosis not present

## 2016-09-25 DIAGNOSIS — I1311 Hypertensive heart and chronic kidney disease without heart failure, with stage 5 chronic kidney disease, or end stage renal disease: Secondary | ICD-10-CM | POA: Diagnosis not present

## 2016-09-25 DIAGNOSIS — D631 Anemia in chronic kidney disease: Secondary | ICD-10-CM | POA: Diagnosis not present

## 2016-09-28 DIAGNOSIS — I519 Heart disease, unspecified: Secondary | ICD-10-CM | POA: Diagnosis not present

## 2016-09-28 DIAGNOSIS — N186 End stage renal disease: Secondary | ICD-10-CM | POA: Diagnosis not present

## 2016-09-28 DIAGNOSIS — I1311 Hypertensive heart and chronic kidney disease without heart failure, with stage 5 chronic kidney disease, or end stage renal disease: Secondary | ICD-10-CM | POA: Diagnosis not present

## 2016-09-28 DIAGNOSIS — I471 Supraventricular tachycardia: Secondary | ICD-10-CM | POA: Diagnosis not present

## 2016-09-28 DIAGNOSIS — I2510 Atherosclerotic heart disease of native coronary artery without angina pectoris: Secondary | ICD-10-CM | POA: Diagnosis not present

## 2016-09-28 DIAGNOSIS — D631 Anemia in chronic kidney disease: Secondary | ICD-10-CM | POA: Diagnosis not present

## 2016-09-29 DIAGNOSIS — D631 Anemia in chronic kidney disease: Secondary | ICD-10-CM | POA: Diagnosis not present

## 2016-09-29 DIAGNOSIS — I2510 Atherosclerotic heart disease of native coronary artery without angina pectoris: Secondary | ICD-10-CM | POA: Diagnosis not present

## 2016-09-29 DIAGNOSIS — N186 End stage renal disease: Secondary | ICD-10-CM | POA: Diagnosis not present

## 2016-09-29 DIAGNOSIS — I519 Heart disease, unspecified: Secondary | ICD-10-CM | POA: Diagnosis not present

## 2016-09-29 DIAGNOSIS — I1311 Hypertensive heart and chronic kidney disease without heart failure, with stage 5 chronic kidney disease, or end stage renal disease: Secondary | ICD-10-CM | POA: Diagnosis not present

## 2016-09-29 DIAGNOSIS — I471 Supraventricular tachycardia: Secondary | ICD-10-CM | POA: Diagnosis not present

## 2016-09-30 DIAGNOSIS — I2510 Atherosclerotic heart disease of native coronary artery without angina pectoris: Secondary | ICD-10-CM | POA: Diagnosis not present

## 2016-09-30 DIAGNOSIS — I1311 Hypertensive heart and chronic kidney disease without heart failure, with stage 5 chronic kidney disease, or end stage renal disease: Secondary | ICD-10-CM | POA: Diagnosis not present

## 2016-09-30 DIAGNOSIS — D631 Anemia in chronic kidney disease: Secondary | ICD-10-CM | POA: Diagnosis not present

## 2016-09-30 DIAGNOSIS — N186 End stage renal disease: Secondary | ICD-10-CM | POA: Diagnosis not present

## 2016-09-30 DIAGNOSIS — I519 Heart disease, unspecified: Secondary | ICD-10-CM | POA: Diagnosis not present

## 2016-09-30 DIAGNOSIS — I471 Supraventricular tachycardia: Secondary | ICD-10-CM | POA: Diagnosis not present

## 2016-10-01 DIAGNOSIS — D631 Anemia in chronic kidney disease: Secondary | ICD-10-CM | POA: Diagnosis not present

## 2016-10-01 DIAGNOSIS — I471 Supraventricular tachycardia: Secondary | ICD-10-CM | POA: Diagnosis not present

## 2016-10-01 DIAGNOSIS — I519 Heart disease, unspecified: Secondary | ICD-10-CM | POA: Diagnosis not present

## 2016-10-01 DIAGNOSIS — N186 End stage renal disease: Secondary | ICD-10-CM | POA: Diagnosis not present

## 2016-10-01 DIAGNOSIS — I1311 Hypertensive heart and chronic kidney disease without heart failure, with stage 5 chronic kidney disease, or end stage renal disease: Secondary | ICD-10-CM | POA: Diagnosis not present

## 2016-10-01 DIAGNOSIS — I2510 Atherosclerotic heart disease of native coronary artery without angina pectoris: Secondary | ICD-10-CM | POA: Diagnosis not present

## 2016-10-02 DIAGNOSIS — I2510 Atherosclerotic heart disease of native coronary artery without angina pectoris: Secondary | ICD-10-CM | POA: Diagnosis not present

## 2016-10-02 DIAGNOSIS — D631 Anemia in chronic kidney disease: Secondary | ICD-10-CM | POA: Diagnosis not present

## 2016-10-02 DIAGNOSIS — N186 End stage renal disease: Secondary | ICD-10-CM | POA: Diagnosis not present

## 2016-10-02 DIAGNOSIS — I1311 Hypertensive heart and chronic kidney disease without heart failure, with stage 5 chronic kidney disease, or end stage renal disease: Secondary | ICD-10-CM | POA: Diagnosis not present

## 2016-10-02 DIAGNOSIS — I471 Supraventricular tachycardia: Secondary | ICD-10-CM | POA: Diagnosis not present

## 2016-10-02 DIAGNOSIS — I519 Heart disease, unspecified: Secondary | ICD-10-CM | POA: Diagnosis not present

## 2016-10-05 DIAGNOSIS — I2510 Atherosclerotic heart disease of native coronary artery without angina pectoris: Secondary | ICD-10-CM | POA: Diagnosis not present

## 2016-10-05 DIAGNOSIS — D631 Anemia in chronic kidney disease: Secondary | ICD-10-CM | POA: Diagnosis not present

## 2016-10-05 DIAGNOSIS — I1311 Hypertensive heart and chronic kidney disease without heart failure, with stage 5 chronic kidney disease, or end stage renal disease: Secondary | ICD-10-CM | POA: Diagnosis not present

## 2016-10-05 DIAGNOSIS — I471 Supraventricular tachycardia: Secondary | ICD-10-CM | POA: Diagnosis not present

## 2016-10-05 DIAGNOSIS — I519 Heart disease, unspecified: Secondary | ICD-10-CM | POA: Diagnosis not present

## 2016-10-05 DIAGNOSIS — N186 End stage renal disease: Secondary | ICD-10-CM | POA: Diagnosis not present

## 2016-10-06 DIAGNOSIS — I1311 Hypertensive heart and chronic kidney disease without heart failure, with stage 5 chronic kidney disease, or end stage renal disease: Secondary | ICD-10-CM | POA: Diagnosis not present

## 2016-10-06 DIAGNOSIS — D631 Anemia in chronic kidney disease: Secondary | ICD-10-CM | POA: Diagnosis not present

## 2016-10-06 DIAGNOSIS — I471 Supraventricular tachycardia: Secondary | ICD-10-CM | POA: Diagnosis not present

## 2016-10-06 DIAGNOSIS — N186 End stage renal disease: Secondary | ICD-10-CM | POA: Diagnosis not present

## 2016-10-06 DIAGNOSIS — I519 Heart disease, unspecified: Secondary | ICD-10-CM | POA: Diagnosis not present

## 2016-10-06 DIAGNOSIS — I2510 Atherosclerotic heart disease of native coronary artery without angina pectoris: Secondary | ICD-10-CM | POA: Diagnosis not present

## 2016-10-07 DIAGNOSIS — I2510 Atherosclerotic heart disease of native coronary artery without angina pectoris: Secondary | ICD-10-CM | POA: Diagnosis not present

## 2016-10-07 DIAGNOSIS — I471 Supraventricular tachycardia: Secondary | ICD-10-CM | POA: Diagnosis not present

## 2016-10-07 DIAGNOSIS — D631 Anemia in chronic kidney disease: Secondary | ICD-10-CM | POA: Diagnosis not present

## 2016-10-07 DIAGNOSIS — I1311 Hypertensive heart and chronic kidney disease without heart failure, with stage 5 chronic kidney disease, or end stage renal disease: Secondary | ICD-10-CM | POA: Diagnosis not present

## 2016-10-07 DIAGNOSIS — N186 End stage renal disease: Secondary | ICD-10-CM | POA: Diagnosis not present

## 2016-10-07 DIAGNOSIS — I519 Heart disease, unspecified: Secondary | ICD-10-CM | POA: Diagnosis not present

## 2016-10-08 DIAGNOSIS — D631 Anemia in chronic kidney disease: Secondary | ICD-10-CM | POA: Diagnosis not present

## 2016-10-08 DIAGNOSIS — N186 End stage renal disease: Secondary | ICD-10-CM | POA: Diagnosis not present

## 2016-10-08 DIAGNOSIS — I2510 Atherosclerotic heart disease of native coronary artery without angina pectoris: Secondary | ICD-10-CM | POA: Diagnosis not present

## 2016-10-08 DIAGNOSIS — I471 Supraventricular tachycardia: Secondary | ICD-10-CM | POA: Diagnosis not present

## 2016-10-08 DIAGNOSIS — I1311 Hypertensive heart and chronic kidney disease without heart failure, with stage 5 chronic kidney disease, or end stage renal disease: Secondary | ICD-10-CM | POA: Diagnosis not present

## 2016-10-08 DIAGNOSIS — I519 Heart disease, unspecified: Secondary | ICD-10-CM | POA: Diagnosis not present

## 2016-10-09 DIAGNOSIS — N186 End stage renal disease: Secondary | ICD-10-CM | POA: Diagnosis not present

## 2016-10-09 DIAGNOSIS — I471 Supraventricular tachycardia: Secondary | ICD-10-CM | POA: Diagnosis not present

## 2016-10-09 DIAGNOSIS — D631 Anemia in chronic kidney disease: Secondary | ICD-10-CM | POA: Diagnosis not present

## 2016-10-09 DIAGNOSIS — I519 Heart disease, unspecified: Secondary | ICD-10-CM | POA: Diagnosis not present

## 2016-10-09 DIAGNOSIS — I2510 Atherosclerotic heart disease of native coronary artery without angina pectoris: Secondary | ICD-10-CM | POA: Diagnosis not present

## 2016-10-09 DIAGNOSIS — I1311 Hypertensive heart and chronic kidney disease without heart failure, with stage 5 chronic kidney disease, or end stage renal disease: Secondary | ICD-10-CM | POA: Diagnosis not present

## 2016-10-12 DIAGNOSIS — I471 Supraventricular tachycardia: Secondary | ICD-10-CM | POA: Diagnosis not present

## 2016-10-12 DIAGNOSIS — I2510 Atherosclerotic heart disease of native coronary artery without angina pectoris: Secondary | ICD-10-CM | POA: Diagnosis not present

## 2016-10-12 DIAGNOSIS — N186 End stage renal disease: Secondary | ICD-10-CM | POA: Diagnosis not present

## 2016-10-12 DIAGNOSIS — I1311 Hypertensive heart and chronic kidney disease without heart failure, with stage 5 chronic kidney disease, or end stage renal disease: Secondary | ICD-10-CM | POA: Diagnosis not present

## 2016-10-12 DIAGNOSIS — I519 Heart disease, unspecified: Secondary | ICD-10-CM | POA: Diagnosis not present

## 2016-10-12 DIAGNOSIS — D631 Anemia in chronic kidney disease: Secondary | ICD-10-CM | POA: Diagnosis not present

## 2016-10-13 DIAGNOSIS — I1311 Hypertensive heart and chronic kidney disease without heart failure, with stage 5 chronic kidney disease, or end stage renal disease: Secondary | ICD-10-CM | POA: Diagnosis not present

## 2016-10-13 DIAGNOSIS — I2510 Atherosclerotic heart disease of native coronary artery without angina pectoris: Secondary | ICD-10-CM | POA: Diagnosis not present

## 2016-10-13 DIAGNOSIS — N186 End stage renal disease: Secondary | ICD-10-CM | POA: Diagnosis not present

## 2016-10-13 DIAGNOSIS — I519 Heart disease, unspecified: Secondary | ICD-10-CM | POA: Diagnosis not present

## 2016-10-13 DIAGNOSIS — D631 Anemia in chronic kidney disease: Secondary | ICD-10-CM | POA: Diagnosis not present

## 2016-10-13 DIAGNOSIS — I471 Supraventricular tachycardia: Secondary | ICD-10-CM | POA: Diagnosis not present

## 2016-10-14 DIAGNOSIS — I471 Supraventricular tachycardia: Secondary | ICD-10-CM | POA: Diagnosis not present

## 2016-10-14 DIAGNOSIS — D631 Anemia in chronic kidney disease: Secondary | ICD-10-CM | POA: Diagnosis not present

## 2016-10-14 DIAGNOSIS — I519 Heart disease, unspecified: Secondary | ICD-10-CM | POA: Diagnosis not present

## 2016-10-14 DIAGNOSIS — N186 End stage renal disease: Secondary | ICD-10-CM | POA: Diagnosis not present

## 2016-10-14 DIAGNOSIS — I2510 Atherosclerotic heart disease of native coronary artery without angina pectoris: Secondary | ICD-10-CM | POA: Diagnosis not present

## 2016-10-14 DIAGNOSIS — I1311 Hypertensive heart and chronic kidney disease without heart failure, with stage 5 chronic kidney disease, or end stage renal disease: Secondary | ICD-10-CM | POA: Diagnosis not present

## 2016-10-15 DIAGNOSIS — I519 Heart disease, unspecified: Secondary | ICD-10-CM | POA: Diagnosis not present

## 2016-10-15 DIAGNOSIS — I1311 Hypertensive heart and chronic kidney disease without heart failure, with stage 5 chronic kidney disease, or end stage renal disease: Secondary | ICD-10-CM | POA: Diagnosis not present

## 2016-10-15 DIAGNOSIS — I2510 Atherosclerotic heart disease of native coronary artery without angina pectoris: Secondary | ICD-10-CM | POA: Diagnosis not present

## 2016-10-15 DIAGNOSIS — N186 End stage renal disease: Secondary | ICD-10-CM | POA: Diagnosis not present

## 2016-10-15 DIAGNOSIS — D631 Anemia in chronic kidney disease: Secondary | ICD-10-CM | POA: Diagnosis not present

## 2016-10-15 DIAGNOSIS — I471 Supraventricular tachycardia: Secondary | ICD-10-CM | POA: Diagnosis not present

## 2016-10-16 DIAGNOSIS — I2510 Atherosclerotic heart disease of native coronary artery without angina pectoris: Secondary | ICD-10-CM | POA: Diagnosis not present

## 2016-10-16 DIAGNOSIS — I471 Supraventricular tachycardia: Secondary | ICD-10-CM | POA: Diagnosis not present

## 2016-10-16 DIAGNOSIS — I519 Heart disease, unspecified: Secondary | ICD-10-CM | POA: Diagnosis not present

## 2016-10-16 DIAGNOSIS — I1311 Hypertensive heart and chronic kidney disease without heart failure, with stage 5 chronic kidney disease, or end stage renal disease: Secondary | ICD-10-CM | POA: Diagnosis not present

## 2016-10-16 DIAGNOSIS — D631 Anemia in chronic kidney disease: Secondary | ICD-10-CM | POA: Diagnosis not present

## 2016-10-16 DIAGNOSIS — N186 End stage renal disease: Secondary | ICD-10-CM | POA: Diagnosis not present

## 2016-10-19 DIAGNOSIS — N186 End stage renal disease: Secondary | ICD-10-CM | POA: Diagnosis not present

## 2016-10-19 DIAGNOSIS — I519 Heart disease, unspecified: Secondary | ICD-10-CM | POA: Diagnosis not present

## 2016-10-19 DIAGNOSIS — D631 Anemia in chronic kidney disease: Secondary | ICD-10-CM | POA: Diagnosis not present

## 2016-10-19 DIAGNOSIS — I1311 Hypertensive heart and chronic kidney disease without heart failure, with stage 5 chronic kidney disease, or end stage renal disease: Secondary | ICD-10-CM | POA: Diagnosis not present

## 2016-10-19 DIAGNOSIS — I471 Supraventricular tachycardia: Secondary | ICD-10-CM | POA: Diagnosis not present

## 2016-10-19 DIAGNOSIS — I2510 Atherosclerotic heart disease of native coronary artery without angina pectoris: Secondary | ICD-10-CM | POA: Diagnosis not present

## 2016-10-20 DIAGNOSIS — D631 Anemia in chronic kidney disease: Secondary | ICD-10-CM | POA: Diagnosis not present

## 2016-10-20 DIAGNOSIS — I2510 Atherosclerotic heart disease of native coronary artery without angina pectoris: Secondary | ICD-10-CM | POA: Diagnosis not present

## 2016-10-20 DIAGNOSIS — I471 Supraventricular tachycardia: Secondary | ICD-10-CM | POA: Diagnosis not present

## 2016-10-20 DIAGNOSIS — I519 Heart disease, unspecified: Secondary | ICD-10-CM | POA: Diagnosis not present

## 2016-10-20 DIAGNOSIS — S81812D Laceration without foreign body, left lower leg, subsequent encounter: Secondary | ICD-10-CM | POA: Diagnosis not present

## 2016-10-20 DIAGNOSIS — S41112D Laceration without foreign body of left upper arm, subsequent encounter: Secondary | ICD-10-CM | POA: Diagnosis not present

## 2016-10-20 DIAGNOSIS — M109 Gout, unspecified: Secondary | ICD-10-CM | POA: Diagnosis not present

## 2016-10-20 DIAGNOSIS — I340 Nonrheumatic mitral (valve) insufficiency: Secondary | ICD-10-CM | POA: Diagnosis not present

## 2016-10-20 DIAGNOSIS — E785 Hyperlipidemia, unspecified: Secondary | ICD-10-CM | POA: Diagnosis not present

## 2016-10-20 DIAGNOSIS — I361 Nonrheumatic tricuspid (valve) insufficiency: Secondary | ICD-10-CM | POA: Diagnosis not present

## 2016-10-20 DIAGNOSIS — Z7982 Long term (current) use of aspirin: Secondary | ICD-10-CM | POA: Diagnosis not present

## 2016-10-20 DIAGNOSIS — I480 Paroxysmal atrial fibrillation: Secondary | ICD-10-CM | POA: Diagnosis not present

## 2016-10-20 DIAGNOSIS — N186 End stage renal disease: Secondary | ICD-10-CM | POA: Diagnosis not present

## 2016-10-20 DIAGNOSIS — I1311 Hypertensive heart and chronic kidney disease without heart failure, with stage 5 chronic kidney disease, or end stage renal disease: Secondary | ICD-10-CM | POA: Diagnosis not present

## 2016-10-20 DIAGNOSIS — E039 Hypothyroidism, unspecified: Secondary | ICD-10-CM | POA: Diagnosis not present

## 2016-10-20 DIAGNOSIS — F329 Major depressive disorder, single episode, unspecified: Secondary | ICD-10-CM | POA: Diagnosis not present

## 2016-10-20 DIAGNOSIS — E559 Vitamin D deficiency, unspecified: Secondary | ICD-10-CM | POA: Diagnosis not present

## 2016-10-20 DIAGNOSIS — Z9181 History of falling: Secondary | ICD-10-CM | POA: Diagnosis not present

## 2016-10-21 DIAGNOSIS — I1311 Hypertensive heart and chronic kidney disease without heart failure, with stage 5 chronic kidney disease, or end stage renal disease: Secondary | ICD-10-CM | POA: Diagnosis not present

## 2016-10-21 DIAGNOSIS — I2510 Atherosclerotic heart disease of native coronary artery without angina pectoris: Secondary | ICD-10-CM | POA: Diagnosis not present

## 2016-10-21 DIAGNOSIS — I471 Supraventricular tachycardia: Secondary | ICD-10-CM | POA: Diagnosis not present

## 2016-10-21 DIAGNOSIS — D631 Anemia in chronic kidney disease: Secondary | ICD-10-CM | POA: Diagnosis not present

## 2016-10-21 DIAGNOSIS — I519 Heart disease, unspecified: Secondary | ICD-10-CM | POA: Diagnosis not present

## 2016-10-21 DIAGNOSIS — N186 End stage renal disease: Secondary | ICD-10-CM | POA: Diagnosis not present

## 2016-10-22 DIAGNOSIS — I471 Supraventricular tachycardia: Secondary | ICD-10-CM | POA: Diagnosis not present

## 2016-10-22 DIAGNOSIS — I2510 Atherosclerotic heart disease of native coronary artery without angina pectoris: Secondary | ICD-10-CM | POA: Diagnosis not present

## 2016-10-22 DIAGNOSIS — I1311 Hypertensive heart and chronic kidney disease without heart failure, with stage 5 chronic kidney disease, or end stage renal disease: Secondary | ICD-10-CM | POA: Diagnosis not present

## 2016-10-22 DIAGNOSIS — I519 Heart disease, unspecified: Secondary | ICD-10-CM | POA: Diagnosis not present

## 2016-10-22 DIAGNOSIS — D631 Anemia in chronic kidney disease: Secondary | ICD-10-CM | POA: Diagnosis not present

## 2016-10-22 DIAGNOSIS — N186 End stage renal disease: Secondary | ICD-10-CM | POA: Diagnosis not present

## 2016-10-23 DIAGNOSIS — I1311 Hypertensive heart and chronic kidney disease without heart failure, with stage 5 chronic kidney disease, or end stage renal disease: Secondary | ICD-10-CM | POA: Diagnosis not present

## 2016-10-23 DIAGNOSIS — I519 Heart disease, unspecified: Secondary | ICD-10-CM | POA: Diagnosis not present

## 2016-10-23 DIAGNOSIS — I471 Supraventricular tachycardia: Secondary | ICD-10-CM | POA: Diagnosis not present

## 2016-10-23 DIAGNOSIS — I2510 Atherosclerotic heart disease of native coronary artery without angina pectoris: Secondary | ICD-10-CM | POA: Diagnosis not present

## 2016-10-23 DIAGNOSIS — D631 Anemia in chronic kidney disease: Secondary | ICD-10-CM | POA: Diagnosis not present

## 2016-10-23 DIAGNOSIS — N186 End stage renal disease: Secondary | ICD-10-CM | POA: Diagnosis not present

## 2016-10-26 DIAGNOSIS — N186 End stage renal disease: Secondary | ICD-10-CM | POA: Diagnosis not present

## 2016-10-26 DIAGNOSIS — D631 Anemia in chronic kidney disease: Secondary | ICD-10-CM | POA: Diagnosis not present

## 2016-10-26 DIAGNOSIS — I1311 Hypertensive heart and chronic kidney disease without heart failure, with stage 5 chronic kidney disease, or end stage renal disease: Secondary | ICD-10-CM | POA: Diagnosis not present

## 2016-10-26 DIAGNOSIS — I471 Supraventricular tachycardia: Secondary | ICD-10-CM | POA: Diagnosis not present

## 2016-10-26 DIAGNOSIS — I2510 Atherosclerotic heart disease of native coronary artery without angina pectoris: Secondary | ICD-10-CM | POA: Diagnosis not present

## 2016-10-26 DIAGNOSIS — I519 Heart disease, unspecified: Secondary | ICD-10-CM | POA: Diagnosis not present

## 2016-10-27 DIAGNOSIS — I2510 Atherosclerotic heart disease of native coronary artery without angina pectoris: Secondary | ICD-10-CM | POA: Diagnosis not present

## 2016-10-27 DIAGNOSIS — D631 Anemia in chronic kidney disease: Secondary | ICD-10-CM | POA: Diagnosis not present

## 2016-10-27 DIAGNOSIS — I471 Supraventricular tachycardia: Secondary | ICD-10-CM | POA: Diagnosis not present

## 2016-10-27 DIAGNOSIS — N186 End stage renal disease: Secondary | ICD-10-CM | POA: Diagnosis not present

## 2016-10-27 DIAGNOSIS — I1311 Hypertensive heart and chronic kidney disease without heart failure, with stage 5 chronic kidney disease, or end stage renal disease: Secondary | ICD-10-CM | POA: Diagnosis not present

## 2016-10-27 DIAGNOSIS — I519 Heart disease, unspecified: Secondary | ICD-10-CM | POA: Diagnosis not present

## 2016-10-28 DIAGNOSIS — D631 Anemia in chronic kidney disease: Secondary | ICD-10-CM | POA: Diagnosis not present

## 2016-10-28 DIAGNOSIS — I2510 Atherosclerotic heart disease of native coronary artery without angina pectoris: Secondary | ICD-10-CM | POA: Diagnosis not present

## 2016-10-28 DIAGNOSIS — I471 Supraventricular tachycardia: Secondary | ICD-10-CM | POA: Diagnosis not present

## 2016-10-28 DIAGNOSIS — I519 Heart disease, unspecified: Secondary | ICD-10-CM | POA: Diagnosis not present

## 2016-10-28 DIAGNOSIS — I1311 Hypertensive heart and chronic kidney disease without heart failure, with stage 5 chronic kidney disease, or end stage renal disease: Secondary | ICD-10-CM | POA: Diagnosis not present

## 2016-10-28 DIAGNOSIS — N186 End stage renal disease: Secondary | ICD-10-CM | POA: Diagnosis not present

## 2016-10-29 DIAGNOSIS — N186 End stage renal disease: Secondary | ICD-10-CM | POA: Diagnosis not present

## 2016-10-29 DIAGNOSIS — I519 Heart disease, unspecified: Secondary | ICD-10-CM | POA: Diagnosis not present

## 2016-10-29 DIAGNOSIS — D631 Anemia in chronic kidney disease: Secondary | ICD-10-CM | POA: Diagnosis not present

## 2016-10-29 DIAGNOSIS — I2510 Atherosclerotic heart disease of native coronary artery without angina pectoris: Secondary | ICD-10-CM | POA: Diagnosis not present

## 2016-10-29 DIAGNOSIS — I471 Supraventricular tachycardia: Secondary | ICD-10-CM | POA: Diagnosis not present

## 2016-10-29 DIAGNOSIS — I1311 Hypertensive heart and chronic kidney disease without heart failure, with stage 5 chronic kidney disease, or end stage renal disease: Secondary | ICD-10-CM | POA: Diagnosis not present

## 2016-10-30 DIAGNOSIS — I519 Heart disease, unspecified: Secondary | ICD-10-CM | POA: Diagnosis not present

## 2016-10-30 DIAGNOSIS — I471 Supraventricular tachycardia: Secondary | ICD-10-CM | POA: Diagnosis not present

## 2016-10-30 DIAGNOSIS — I2510 Atherosclerotic heart disease of native coronary artery without angina pectoris: Secondary | ICD-10-CM | POA: Diagnosis not present

## 2016-10-30 DIAGNOSIS — N186 End stage renal disease: Secondary | ICD-10-CM | POA: Diagnosis not present

## 2016-10-30 DIAGNOSIS — I1311 Hypertensive heart and chronic kidney disease without heart failure, with stage 5 chronic kidney disease, or end stage renal disease: Secondary | ICD-10-CM | POA: Diagnosis not present

## 2016-10-30 DIAGNOSIS — D631 Anemia in chronic kidney disease: Secondary | ICD-10-CM | POA: Diagnosis not present

## 2016-11-02 DIAGNOSIS — I1311 Hypertensive heart and chronic kidney disease without heart failure, with stage 5 chronic kidney disease, or end stage renal disease: Secondary | ICD-10-CM | POA: Diagnosis not present

## 2016-11-02 DIAGNOSIS — N186 End stage renal disease: Secondary | ICD-10-CM | POA: Diagnosis not present

## 2016-11-02 DIAGNOSIS — I519 Heart disease, unspecified: Secondary | ICD-10-CM | POA: Diagnosis not present

## 2016-11-02 DIAGNOSIS — D631 Anemia in chronic kidney disease: Secondary | ICD-10-CM | POA: Diagnosis not present

## 2016-11-02 DIAGNOSIS — I2510 Atherosclerotic heart disease of native coronary artery without angina pectoris: Secondary | ICD-10-CM | POA: Diagnosis not present

## 2016-11-02 DIAGNOSIS — I471 Supraventricular tachycardia: Secondary | ICD-10-CM | POA: Diagnosis not present

## 2016-11-03 DIAGNOSIS — D631 Anemia in chronic kidney disease: Secondary | ICD-10-CM | POA: Diagnosis not present

## 2016-11-03 DIAGNOSIS — N186 End stage renal disease: Secondary | ICD-10-CM | POA: Diagnosis not present

## 2016-11-03 DIAGNOSIS — I2510 Atherosclerotic heart disease of native coronary artery without angina pectoris: Secondary | ICD-10-CM | POA: Diagnosis not present

## 2016-11-03 DIAGNOSIS — I519 Heart disease, unspecified: Secondary | ICD-10-CM | POA: Diagnosis not present

## 2016-11-03 DIAGNOSIS — I471 Supraventricular tachycardia: Secondary | ICD-10-CM | POA: Diagnosis not present

## 2016-11-03 DIAGNOSIS — I1311 Hypertensive heart and chronic kidney disease without heart failure, with stage 5 chronic kidney disease, or end stage renal disease: Secondary | ICD-10-CM | POA: Diagnosis not present

## 2016-11-04 DIAGNOSIS — I2510 Atherosclerotic heart disease of native coronary artery without angina pectoris: Secondary | ICD-10-CM | POA: Diagnosis not present

## 2016-11-04 DIAGNOSIS — I471 Supraventricular tachycardia: Secondary | ICD-10-CM | POA: Diagnosis not present

## 2016-11-04 DIAGNOSIS — I519 Heart disease, unspecified: Secondary | ICD-10-CM | POA: Diagnosis not present

## 2016-11-04 DIAGNOSIS — N186 End stage renal disease: Secondary | ICD-10-CM | POA: Diagnosis not present

## 2016-11-04 DIAGNOSIS — D631 Anemia in chronic kidney disease: Secondary | ICD-10-CM | POA: Diagnosis not present

## 2016-11-04 DIAGNOSIS — I1311 Hypertensive heart and chronic kidney disease without heart failure, with stage 5 chronic kidney disease, or end stage renal disease: Secondary | ICD-10-CM | POA: Diagnosis not present

## 2016-11-05 DIAGNOSIS — D631 Anemia in chronic kidney disease: Secondary | ICD-10-CM | POA: Diagnosis not present

## 2016-11-05 DIAGNOSIS — I2510 Atherosclerotic heart disease of native coronary artery without angina pectoris: Secondary | ICD-10-CM | POA: Diagnosis not present

## 2016-11-05 DIAGNOSIS — I519 Heart disease, unspecified: Secondary | ICD-10-CM | POA: Diagnosis not present

## 2016-11-05 DIAGNOSIS — I1311 Hypertensive heart and chronic kidney disease without heart failure, with stage 5 chronic kidney disease, or end stage renal disease: Secondary | ICD-10-CM | POA: Diagnosis not present

## 2016-11-05 DIAGNOSIS — I471 Supraventricular tachycardia: Secondary | ICD-10-CM | POA: Diagnosis not present

## 2016-11-05 DIAGNOSIS — N186 End stage renal disease: Secondary | ICD-10-CM | POA: Diagnosis not present

## 2016-11-06 DIAGNOSIS — I519 Heart disease, unspecified: Secondary | ICD-10-CM | POA: Diagnosis not present

## 2016-11-06 DIAGNOSIS — I471 Supraventricular tachycardia: Secondary | ICD-10-CM | POA: Diagnosis not present

## 2016-11-06 DIAGNOSIS — I1311 Hypertensive heart and chronic kidney disease without heart failure, with stage 5 chronic kidney disease, or end stage renal disease: Secondary | ICD-10-CM | POA: Diagnosis not present

## 2016-11-06 DIAGNOSIS — D631 Anemia in chronic kidney disease: Secondary | ICD-10-CM | POA: Diagnosis not present

## 2016-11-06 DIAGNOSIS — N186 End stage renal disease: Secondary | ICD-10-CM | POA: Diagnosis not present

## 2016-11-06 DIAGNOSIS — I2510 Atherosclerotic heart disease of native coronary artery without angina pectoris: Secondary | ICD-10-CM | POA: Diagnosis not present

## 2016-11-09 DIAGNOSIS — I471 Supraventricular tachycardia: Secondary | ICD-10-CM | POA: Diagnosis not present

## 2016-11-09 DIAGNOSIS — D631 Anemia in chronic kidney disease: Secondary | ICD-10-CM | POA: Diagnosis not present

## 2016-11-09 DIAGNOSIS — N186 End stage renal disease: Secondary | ICD-10-CM | POA: Diagnosis not present

## 2016-11-09 DIAGNOSIS — I2510 Atherosclerotic heart disease of native coronary artery without angina pectoris: Secondary | ICD-10-CM | POA: Diagnosis not present

## 2016-11-09 DIAGNOSIS — I519 Heart disease, unspecified: Secondary | ICD-10-CM | POA: Diagnosis not present

## 2016-11-09 DIAGNOSIS — I1311 Hypertensive heart and chronic kidney disease without heart failure, with stage 5 chronic kidney disease, or end stage renal disease: Secondary | ICD-10-CM | POA: Diagnosis not present

## 2016-11-10 DIAGNOSIS — I519 Heart disease, unspecified: Secondary | ICD-10-CM | POA: Diagnosis not present

## 2016-11-10 DIAGNOSIS — I2510 Atherosclerotic heart disease of native coronary artery without angina pectoris: Secondary | ICD-10-CM | POA: Diagnosis not present

## 2016-11-10 DIAGNOSIS — D631 Anemia in chronic kidney disease: Secondary | ICD-10-CM | POA: Diagnosis not present

## 2016-11-10 DIAGNOSIS — I471 Supraventricular tachycardia: Secondary | ICD-10-CM | POA: Diagnosis not present

## 2016-11-10 DIAGNOSIS — I1311 Hypertensive heart and chronic kidney disease without heart failure, with stage 5 chronic kidney disease, or end stage renal disease: Secondary | ICD-10-CM | POA: Diagnosis not present

## 2016-11-10 DIAGNOSIS — N186 End stage renal disease: Secondary | ICD-10-CM | POA: Diagnosis not present

## 2016-11-11 DIAGNOSIS — I471 Supraventricular tachycardia: Secondary | ICD-10-CM | POA: Diagnosis not present

## 2016-11-11 DIAGNOSIS — I2510 Atherosclerotic heart disease of native coronary artery without angina pectoris: Secondary | ICD-10-CM | POA: Diagnosis not present

## 2016-11-11 DIAGNOSIS — N186 End stage renal disease: Secondary | ICD-10-CM | POA: Diagnosis not present

## 2016-11-11 DIAGNOSIS — D631 Anemia in chronic kidney disease: Secondary | ICD-10-CM | POA: Diagnosis not present

## 2016-11-11 DIAGNOSIS — I519 Heart disease, unspecified: Secondary | ICD-10-CM | POA: Diagnosis not present

## 2016-11-11 DIAGNOSIS — I1311 Hypertensive heart and chronic kidney disease without heart failure, with stage 5 chronic kidney disease, or end stage renal disease: Secondary | ICD-10-CM | POA: Diagnosis not present

## 2016-11-12 DIAGNOSIS — I471 Supraventricular tachycardia: Secondary | ICD-10-CM | POA: Diagnosis not present

## 2016-11-12 DIAGNOSIS — N186 End stage renal disease: Secondary | ICD-10-CM | POA: Diagnosis not present

## 2016-11-12 DIAGNOSIS — I1311 Hypertensive heart and chronic kidney disease without heart failure, with stage 5 chronic kidney disease, or end stage renal disease: Secondary | ICD-10-CM | POA: Diagnosis not present

## 2016-11-12 DIAGNOSIS — I519 Heart disease, unspecified: Secondary | ICD-10-CM | POA: Diagnosis not present

## 2016-11-12 DIAGNOSIS — I2510 Atherosclerotic heart disease of native coronary artery without angina pectoris: Secondary | ICD-10-CM | POA: Diagnosis not present

## 2016-11-12 DIAGNOSIS — D631 Anemia in chronic kidney disease: Secondary | ICD-10-CM | POA: Diagnosis not present

## 2016-11-13 DIAGNOSIS — I2510 Atherosclerotic heart disease of native coronary artery without angina pectoris: Secondary | ICD-10-CM | POA: Diagnosis not present

## 2016-11-13 DIAGNOSIS — I471 Supraventricular tachycardia: Secondary | ICD-10-CM | POA: Diagnosis not present

## 2016-11-13 DIAGNOSIS — D631 Anemia in chronic kidney disease: Secondary | ICD-10-CM | POA: Diagnosis not present

## 2016-11-13 DIAGNOSIS — N186 End stage renal disease: Secondary | ICD-10-CM | POA: Diagnosis not present

## 2016-11-13 DIAGNOSIS — I1311 Hypertensive heart and chronic kidney disease without heart failure, with stage 5 chronic kidney disease, or end stage renal disease: Secondary | ICD-10-CM | POA: Diagnosis not present

## 2016-11-13 DIAGNOSIS — I519 Heart disease, unspecified: Secondary | ICD-10-CM | POA: Diagnosis not present

## 2016-11-16 DIAGNOSIS — I1311 Hypertensive heart and chronic kidney disease without heart failure, with stage 5 chronic kidney disease, or end stage renal disease: Secondary | ICD-10-CM | POA: Diagnosis not present

## 2016-11-16 DIAGNOSIS — I2510 Atherosclerotic heart disease of native coronary artery without angina pectoris: Secondary | ICD-10-CM | POA: Diagnosis not present

## 2016-11-16 DIAGNOSIS — D631 Anemia in chronic kidney disease: Secondary | ICD-10-CM | POA: Diagnosis not present

## 2016-11-16 DIAGNOSIS — I471 Supraventricular tachycardia: Secondary | ICD-10-CM | POA: Diagnosis not present

## 2016-11-16 DIAGNOSIS — N186 End stage renal disease: Secondary | ICD-10-CM | POA: Diagnosis not present

## 2016-11-16 DIAGNOSIS — I519 Heart disease, unspecified: Secondary | ICD-10-CM | POA: Diagnosis not present

## 2016-11-17 DIAGNOSIS — I471 Supraventricular tachycardia: Secondary | ICD-10-CM | POA: Diagnosis not present

## 2016-11-17 DIAGNOSIS — I2510 Atherosclerotic heart disease of native coronary artery without angina pectoris: Secondary | ICD-10-CM | POA: Diagnosis not present

## 2016-11-17 DIAGNOSIS — D631 Anemia in chronic kidney disease: Secondary | ICD-10-CM | POA: Diagnosis not present

## 2016-11-17 DIAGNOSIS — I519 Heart disease, unspecified: Secondary | ICD-10-CM | POA: Diagnosis not present

## 2016-11-17 DIAGNOSIS — I1311 Hypertensive heart and chronic kidney disease without heart failure, with stage 5 chronic kidney disease, or end stage renal disease: Secondary | ICD-10-CM | POA: Diagnosis not present

## 2016-11-17 DIAGNOSIS — N186 End stage renal disease: Secondary | ICD-10-CM | POA: Diagnosis not present

## 2016-11-18 DIAGNOSIS — I2510 Atherosclerotic heart disease of native coronary artery without angina pectoris: Secondary | ICD-10-CM | POA: Diagnosis not present

## 2016-11-18 DIAGNOSIS — N186 End stage renal disease: Secondary | ICD-10-CM | POA: Diagnosis not present

## 2016-11-18 DIAGNOSIS — D631 Anemia in chronic kidney disease: Secondary | ICD-10-CM | POA: Diagnosis not present

## 2016-11-18 DIAGNOSIS — I471 Supraventricular tachycardia: Secondary | ICD-10-CM | POA: Diagnosis not present

## 2016-11-18 DIAGNOSIS — I1311 Hypertensive heart and chronic kidney disease without heart failure, with stage 5 chronic kidney disease, or end stage renal disease: Secondary | ICD-10-CM | POA: Diagnosis not present

## 2016-11-18 DIAGNOSIS — I519 Heart disease, unspecified: Secondary | ICD-10-CM | POA: Diagnosis not present

## 2016-11-19 DIAGNOSIS — I2510 Atherosclerotic heart disease of native coronary artery without angina pectoris: Secondary | ICD-10-CM | POA: Diagnosis not present

## 2016-11-19 DIAGNOSIS — N186 End stage renal disease: Secondary | ICD-10-CM | POA: Diagnosis not present

## 2016-11-19 DIAGNOSIS — I1311 Hypertensive heart and chronic kidney disease without heart failure, with stage 5 chronic kidney disease, or end stage renal disease: Secondary | ICD-10-CM | POA: Diagnosis not present

## 2016-11-19 DIAGNOSIS — I519 Heart disease, unspecified: Secondary | ICD-10-CM | POA: Diagnosis not present

## 2016-11-19 DIAGNOSIS — I471 Supraventricular tachycardia: Secondary | ICD-10-CM | POA: Diagnosis not present

## 2016-11-19 DIAGNOSIS — D631 Anemia in chronic kidney disease: Secondary | ICD-10-CM | POA: Diagnosis not present

## 2016-11-20 DIAGNOSIS — F329 Major depressive disorder, single episode, unspecified: Secondary | ICD-10-CM | POA: Diagnosis not present

## 2016-11-20 DIAGNOSIS — E785 Hyperlipidemia, unspecified: Secondary | ICD-10-CM | POA: Diagnosis not present

## 2016-11-20 DIAGNOSIS — M109 Gout, unspecified: Secondary | ICD-10-CM | POA: Diagnosis not present

## 2016-11-20 DIAGNOSIS — N186 End stage renal disease: Secondary | ICD-10-CM | POA: Diagnosis not present

## 2016-11-20 DIAGNOSIS — B372 Candidiasis of skin and nail: Secondary | ICD-10-CM | POA: Diagnosis not present

## 2016-11-20 DIAGNOSIS — I2510 Atherosclerotic heart disease of native coronary artery without angina pectoris: Secondary | ICD-10-CM | POA: Diagnosis not present

## 2016-11-20 DIAGNOSIS — I1311 Hypertensive heart and chronic kidney disease without heart failure, with stage 5 chronic kidney disease, or end stage renal disease: Secondary | ICD-10-CM | POA: Diagnosis not present

## 2016-11-20 DIAGNOSIS — E039 Hypothyroidism, unspecified: Secondary | ICD-10-CM | POA: Diagnosis not present

## 2016-11-20 DIAGNOSIS — Z681 Body mass index (BMI) 19 or less, adult: Secondary | ICD-10-CM | POA: Diagnosis not present

## 2016-11-20 DIAGNOSIS — E559 Vitamin D deficiency, unspecified: Secondary | ICD-10-CM | POA: Diagnosis not present

## 2016-11-20 DIAGNOSIS — I471 Supraventricular tachycardia: Secondary | ICD-10-CM | POA: Diagnosis not present

## 2016-11-20 DIAGNOSIS — I340 Nonrheumatic mitral (valve) insufficiency: Secondary | ICD-10-CM | POA: Diagnosis not present

## 2016-11-20 DIAGNOSIS — I361 Nonrheumatic tricuspid (valve) insufficiency: Secondary | ICD-10-CM | POA: Diagnosis not present

## 2016-11-20 DIAGNOSIS — I480 Paroxysmal atrial fibrillation: Secondary | ICD-10-CM | POA: Diagnosis not present

## 2016-11-20 DIAGNOSIS — Z7982 Long term (current) use of aspirin: Secondary | ICD-10-CM | POA: Diagnosis not present

## 2016-11-20 DIAGNOSIS — Z9181 History of falling: Secondary | ICD-10-CM | POA: Diagnosis not present

## 2016-11-20 DIAGNOSIS — D631 Anemia in chronic kidney disease: Secondary | ICD-10-CM | POA: Diagnosis not present

## 2016-11-23 DIAGNOSIS — I1311 Hypertensive heart and chronic kidney disease without heart failure, with stage 5 chronic kidney disease, or end stage renal disease: Secondary | ICD-10-CM | POA: Diagnosis not present

## 2016-11-23 DIAGNOSIS — D631 Anemia in chronic kidney disease: Secondary | ICD-10-CM | POA: Diagnosis not present

## 2016-11-23 DIAGNOSIS — I2510 Atherosclerotic heart disease of native coronary artery without angina pectoris: Secondary | ICD-10-CM | POA: Diagnosis not present

## 2016-11-23 DIAGNOSIS — N186 End stage renal disease: Secondary | ICD-10-CM | POA: Diagnosis not present

## 2016-11-23 DIAGNOSIS — I471 Supraventricular tachycardia: Secondary | ICD-10-CM | POA: Diagnosis not present

## 2016-11-23 DIAGNOSIS — I480 Paroxysmal atrial fibrillation: Secondary | ICD-10-CM | POA: Diagnosis not present

## 2016-11-24 DIAGNOSIS — D631 Anemia in chronic kidney disease: Secondary | ICD-10-CM | POA: Diagnosis not present

## 2016-11-24 DIAGNOSIS — I1311 Hypertensive heart and chronic kidney disease without heart failure, with stage 5 chronic kidney disease, or end stage renal disease: Secondary | ICD-10-CM | POA: Diagnosis not present

## 2016-11-24 DIAGNOSIS — I2510 Atherosclerotic heart disease of native coronary artery without angina pectoris: Secondary | ICD-10-CM | POA: Diagnosis not present

## 2016-11-24 DIAGNOSIS — N186 End stage renal disease: Secondary | ICD-10-CM | POA: Diagnosis not present

## 2016-11-24 DIAGNOSIS — I471 Supraventricular tachycardia: Secondary | ICD-10-CM | POA: Diagnosis not present

## 2016-11-24 DIAGNOSIS — I480 Paroxysmal atrial fibrillation: Secondary | ICD-10-CM | POA: Diagnosis not present

## 2016-11-25 DIAGNOSIS — I480 Paroxysmal atrial fibrillation: Secondary | ICD-10-CM | POA: Diagnosis not present

## 2016-11-25 DIAGNOSIS — I2510 Atherosclerotic heart disease of native coronary artery without angina pectoris: Secondary | ICD-10-CM | POA: Diagnosis not present

## 2016-11-25 DIAGNOSIS — I1311 Hypertensive heart and chronic kidney disease without heart failure, with stage 5 chronic kidney disease, or end stage renal disease: Secondary | ICD-10-CM | POA: Diagnosis not present

## 2016-11-25 DIAGNOSIS — N186 End stage renal disease: Secondary | ICD-10-CM | POA: Diagnosis not present

## 2016-11-25 DIAGNOSIS — I471 Supraventricular tachycardia: Secondary | ICD-10-CM | POA: Diagnosis not present

## 2016-11-25 DIAGNOSIS — D631 Anemia in chronic kidney disease: Secondary | ICD-10-CM | POA: Diagnosis not present

## 2016-11-26 DIAGNOSIS — N186 End stage renal disease: Secondary | ICD-10-CM | POA: Diagnosis not present

## 2016-11-26 DIAGNOSIS — R5381 Other malaise: Secondary | ICD-10-CM | POA: Diagnosis not present

## 2016-11-26 DIAGNOSIS — I471 Supraventricular tachycardia: Secondary | ICD-10-CM | POA: Diagnosis not present

## 2016-11-26 DIAGNOSIS — I1311 Hypertensive heart and chronic kidney disease without heart failure, with stage 5 chronic kidney disease, or end stage renal disease: Secondary | ICD-10-CM | POA: Diagnosis not present

## 2016-11-26 DIAGNOSIS — D631 Anemia in chronic kidney disease: Secondary | ICD-10-CM | POA: Diagnosis not present

## 2016-11-26 DIAGNOSIS — W1789XA Other fall from one level to another, initial encounter: Secondary | ICD-10-CM | POA: Diagnosis not present

## 2016-11-26 DIAGNOSIS — I480 Paroxysmal atrial fibrillation: Secondary | ICD-10-CM | POA: Diagnosis not present

## 2016-11-26 DIAGNOSIS — I2510 Atherosclerotic heart disease of native coronary artery without angina pectoris: Secondary | ICD-10-CM | POA: Diagnosis not present

## 2016-11-27 DIAGNOSIS — I1311 Hypertensive heart and chronic kidney disease without heart failure, with stage 5 chronic kidney disease, or end stage renal disease: Secondary | ICD-10-CM | POA: Diagnosis not present

## 2016-11-27 DIAGNOSIS — N186 End stage renal disease: Secondary | ICD-10-CM | POA: Diagnosis not present

## 2016-11-27 DIAGNOSIS — I2510 Atherosclerotic heart disease of native coronary artery without angina pectoris: Secondary | ICD-10-CM | POA: Diagnosis not present

## 2016-11-27 DIAGNOSIS — I480 Paroxysmal atrial fibrillation: Secondary | ICD-10-CM | POA: Diagnosis not present

## 2016-11-27 DIAGNOSIS — D631 Anemia in chronic kidney disease: Secondary | ICD-10-CM | POA: Diagnosis not present

## 2016-11-27 DIAGNOSIS — I471 Supraventricular tachycardia: Secondary | ICD-10-CM | POA: Diagnosis not present

## 2016-11-30 DIAGNOSIS — D631 Anemia in chronic kidney disease: Secondary | ICD-10-CM | POA: Diagnosis not present

## 2016-11-30 DIAGNOSIS — N186 End stage renal disease: Secondary | ICD-10-CM | POA: Diagnosis not present

## 2016-11-30 DIAGNOSIS — I2510 Atherosclerotic heart disease of native coronary artery without angina pectoris: Secondary | ICD-10-CM | POA: Diagnosis not present

## 2016-11-30 DIAGNOSIS — I480 Paroxysmal atrial fibrillation: Secondary | ICD-10-CM | POA: Diagnosis not present

## 2016-11-30 DIAGNOSIS — I1311 Hypertensive heart and chronic kidney disease without heart failure, with stage 5 chronic kidney disease, or end stage renal disease: Secondary | ICD-10-CM | POA: Diagnosis not present

## 2016-11-30 DIAGNOSIS — I471 Supraventricular tachycardia: Secondary | ICD-10-CM | POA: Diagnosis not present

## 2016-12-01 DIAGNOSIS — N186 End stage renal disease: Secondary | ICD-10-CM | POA: Diagnosis not present

## 2016-12-01 DIAGNOSIS — I480 Paroxysmal atrial fibrillation: Secondary | ICD-10-CM | POA: Diagnosis not present

## 2016-12-01 DIAGNOSIS — D631 Anemia in chronic kidney disease: Secondary | ICD-10-CM | POA: Diagnosis not present

## 2016-12-01 DIAGNOSIS — I471 Supraventricular tachycardia: Secondary | ICD-10-CM | POA: Diagnosis not present

## 2016-12-01 DIAGNOSIS — I1311 Hypertensive heart and chronic kidney disease without heart failure, with stage 5 chronic kidney disease, or end stage renal disease: Secondary | ICD-10-CM | POA: Diagnosis not present

## 2016-12-01 DIAGNOSIS — I2510 Atherosclerotic heart disease of native coronary artery without angina pectoris: Secondary | ICD-10-CM | POA: Diagnosis not present

## 2016-12-02 DIAGNOSIS — I471 Supraventricular tachycardia: Secondary | ICD-10-CM | POA: Diagnosis not present

## 2016-12-02 DIAGNOSIS — D631 Anemia in chronic kidney disease: Secondary | ICD-10-CM | POA: Diagnosis not present

## 2016-12-02 DIAGNOSIS — N186 End stage renal disease: Secondary | ICD-10-CM | POA: Diagnosis not present

## 2016-12-02 DIAGNOSIS — I1311 Hypertensive heart and chronic kidney disease without heart failure, with stage 5 chronic kidney disease, or end stage renal disease: Secondary | ICD-10-CM | POA: Diagnosis not present

## 2016-12-02 DIAGNOSIS — I480 Paroxysmal atrial fibrillation: Secondary | ICD-10-CM | POA: Diagnosis not present

## 2016-12-02 DIAGNOSIS — I2510 Atherosclerotic heart disease of native coronary artery without angina pectoris: Secondary | ICD-10-CM | POA: Diagnosis not present

## 2016-12-03 DIAGNOSIS — I1311 Hypertensive heart and chronic kidney disease without heart failure, with stage 5 chronic kidney disease, or end stage renal disease: Secondary | ICD-10-CM | POA: Diagnosis not present

## 2016-12-03 DIAGNOSIS — I471 Supraventricular tachycardia: Secondary | ICD-10-CM | POA: Diagnosis not present

## 2016-12-03 DIAGNOSIS — D631 Anemia in chronic kidney disease: Secondary | ICD-10-CM | POA: Diagnosis not present

## 2016-12-03 DIAGNOSIS — I480 Paroxysmal atrial fibrillation: Secondary | ICD-10-CM | POA: Diagnosis not present

## 2016-12-03 DIAGNOSIS — N186 End stage renal disease: Secondary | ICD-10-CM | POA: Diagnosis not present

## 2016-12-03 DIAGNOSIS — I2510 Atherosclerotic heart disease of native coronary artery without angina pectoris: Secondary | ICD-10-CM | POA: Diagnosis not present

## 2016-12-04 DIAGNOSIS — I471 Supraventricular tachycardia: Secondary | ICD-10-CM | POA: Diagnosis not present

## 2016-12-04 DIAGNOSIS — D631 Anemia in chronic kidney disease: Secondary | ICD-10-CM | POA: Diagnosis not present

## 2016-12-04 DIAGNOSIS — N186 End stage renal disease: Secondary | ICD-10-CM | POA: Diagnosis not present

## 2016-12-04 DIAGNOSIS — I2510 Atherosclerotic heart disease of native coronary artery without angina pectoris: Secondary | ICD-10-CM | POA: Diagnosis not present

## 2016-12-04 DIAGNOSIS — I480 Paroxysmal atrial fibrillation: Secondary | ICD-10-CM | POA: Diagnosis not present

## 2016-12-04 DIAGNOSIS — I1311 Hypertensive heart and chronic kidney disease without heart failure, with stage 5 chronic kidney disease, or end stage renal disease: Secondary | ICD-10-CM | POA: Diagnosis not present

## 2016-12-07 DIAGNOSIS — D631 Anemia in chronic kidney disease: Secondary | ICD-10-CM | POA: Diagnosis not present

## 2016-12-07 DIAGNOSIS — N186 End stage renal disease: Secondary | ICD-10-CM | POA: Diagnosis not present

## 2016-12-07 DIAGNOSIS — I480 Paroxysmal atrial fibrillation: Secondary | ICD-10-CM | POA: Diagnosis not present

## 2016-12-07 DIAGNOSIS — I1311 Hypertensive heart and chronic kidney disease without heart failure, with stage 5 chronic kidney disease, or end stage renal disease: Secondary | ICD-10-CM | POA: Diagnosis not present

## 2016-12-07 DIAGNOSIS — I471 Supraventricular tachycardia: Secondary | ICD-10-CM | POA: Diagnosis not present

## 2016-12-07 DIAGNOSIS — I2510 Atherosclerotic heart disease of native coronary artery without angina pectoris: Secondary | ICD-10-CM | POA: Diagnosis not present

## 2016-12-08 DIAGNOSIS — I2510 Atherosclerotic heart disease of native coronary artery without angina pectoris: Secondary | ICD-10-CM | POA: Diagnosis not present

## 2016-12-08 DIAGNOSIS — I471 Supraventricular tachycardia: Secondary | ICD-10-CM | POA: Diagnosis not present

## 2016-12-08 DIAGNOSIS — I480 Paroxysmal atrial fibrillation: Secondary | ICD-10-CM | POA: Diagnosis not present

## 2016-12-08 DIAGNOSIS — I1311 Hypertensive heart and chronic kidney disease without heart failure, with stage 5 chronic kidney disease, or end stage renal disease: Secondary | ICD-10-CM | POA: Diagnosis not present

## 2016-12-08 DIAGNOSIS — N186 End stage renal disease: Secondary | ICD-10-CM | POA: Diagnosis not present

## 2016-12-08 DIAGNOSIS — D631 Anemia in chronic kidney disease: Secondary | ICD-10-CM | POA: Diagnosis not present

## 2016-12-09 DIAGNOSIS — I1311 Hypertensive heart and chronic kidney disease without heart failure, with stage 5 chronic kidney disease, or end stage renal disease: Secondary | ICD-10-CM | POA: Diagnosis not present

## 2016-12-09 DIAGNOSIS — I480 Paroxysmal atrial fibrillation: Secondary | ICD-10-CM | POA: Diagnosis not present

## 2016-12-09 DIAGNOSIS — N186 End stage renal disease: Secondary | ICD-10-CM | POA: Diagnosis not present

## 2016-12-09 DIAGNOSIS — I471 Supraventricular tachycardia: Secondary | ICD-10-CM | POA: Diagnosis not present

## 2016-12-09 DIAGNOSIS — D631 Anemia in chronic kidney disease: Secondary | ICD-10-CM | POA: Diagnosis not present

## 2016-12-09 DIAGNOSIS — I2510 Atherosclerotic heart disease of native coronary artery without angina pectoris: Secondary | ICD-10-CM | POA: Diagnosis not present

## 2016-12-10 DIAGNOSIS — I471 Supraventricular tachycardia: Secondary | ICD-10-CM | POA: Diagnosis not present

## 2016-12-10 DIAGNOSIS — I480 Paroxysmal atrial fibrillation: Secondary | ICD-10-CM | POA: Diagnosis not present

## 2016-12-10 DIAGNOSIS — N186 End stage renal disease: Secondary | ICD-10-CM | POA: Diagnosis not present

## 2016-12-10 DIAGNOSIS — I2510 Atherosclerotic heart disease of native coronary artery without angina pectoris: Secondary | ICD-10-CM | POA: Diagnosis not present

## 2016-12-10 DIAGNOSIS — I1311 Hypertensive heart and chronic kidney disease without heart failure, with stage 5 chronic kidney disease, or end stage renal disease: Secondary | ICD-10-CM | POA: Diagnosis not present

## 2016-12-10 DIAGNOSIS — D631 Anemia in chronic kidney disease: Secondary | ICD-10-CM | POA: Diagnosis not present

## 2016-12-11 DIAGNOSIS — D631 Anemia in chronic kidney disease: Secondary | ICD-10-CM | POA: Diagnosis not present

## 2016-12-11 DIAGNOSIS — I1311 Hypertensive heart and chronic kidney disease without heart failure, with stage 5 chronic kidney disease, or end stage renal disease: Secondary | ICD-10-CM | POA: Diagnosis not present

## 2016-12-11 DIAGNOSIS — I471 Supraventricular tachycardia: Secondary | ICD-10-CM | POA: Diagnosis not present

## 2016-12-11 DIAGNOSIS — I480 Paroxysmal atrial fibrillation: Secondary | ICD-10-CM | POA: Diagnosis not present

## 2016-12-11 DIAGNOSIS — N186 End stage renal disease: Secondary | ICD-10-CM | POA: Diagnosis not present

## 2016-12-11 DIAGNOSIS — I2510 Atherosclerotic heart disease of native coronary artery without angina pectoris: Secondary | ICD-10-CM | POA: Diagnosis not present

## 2016-12-14 DIAGNOSIS — I480 Paroxysmal atrial fibrillation: Secondary | ICD-10-CM | POA: Diagnosis not present

## 2016-12-14 DIAGNOSIS — I2510 Atherosclerotic heart disease of native coronary artery without angina pectoris: Secondary | ICD-10-CM | POA: Diagnosis not present

## 2016-12-14 DIAGNOSIS — I471 Supraventricular tachycardia: Secondary | ICD-10-CM | POA: Diagnosis not present

## 2016-12-14 DIAGNOSIS — N186 End stage renal disease: Secondary | ICD-10-CM | POA: Diagnosis not present

## 2016-12-14 DIAGNOSIS — I1311 Hypertensive heart and chronic kidney disease without heart failure, with stage 5 chronic kidney disease, or end stage renal disease: Secondary | ICD-10-CM | POA: Diagnosis not present

## 2016-12-14 DIAGNOSIS — D631 Anemia in chronic kidney disease: Secondary | ICD-10-CM | POA: Diagnosis not present

## 2016-12-15 DIAGNOSIS — N186 End stage renal disease: Secondary | ICD-10-CM | POA: Diagnosis not present

## 2016-12-15 DIAGNOSIS — D631 Anemia in chronic kidney disease: Secondary | ICD-10-CM | POA: Diagnosis not present

## 2016-12-15 DIAGNOSIS — I480 Paroxysmal atrial fibrillation: Secondary | ICD-10-CM | POA: Diagnosis not present

## 2016-12-15 DIAGNOSIS — I2510 Atherosclerotic heart disease of native coronary artery without angina pectoris: Secondary | ICD-10-CM | POA: Diagnosis not present

## 2016-12-15 DIAGNOSIS — I1311 Hypertensive heart and chronic kidney disease without heart failure, with stage 5 chronic kidney disease, or end stage renal disease: Secondary | ICD-10-CM | POA: Diagnosis not present

## 2016-12-15 DIAGNOSIS — I471 Supraventricular tachycardia: Secondary | ICD-10-CM | POA: Diagnosis not present

## 2016-12-16 DIAGNOSIS — N186 End stage renal disease: Secondary | ICD-10-CM | POA: Diagnosis not present

## 2016-12-16 DIAGNOSIS — I2510 Atherosclerotic heart disease of native coronary artery without angina pectoris: Secondary | ICD-10-CM | POA: Diagnosis not present

## 2016-12-16 DIAGNOSIS — I471 Supraventricular tachycardia: Secondary | ICD-10-CM | POA: Diagnosis not present

## 2016-12-16 DIAGNOSIS — I480 Paroxysmal atrial fibrillation: Secondary | ICD-10-CM | POA: Diagnosis not present

## 2016-12-16 DIAGNOSIS — D631 Anemia in chronic kidney disease: Secondary | ICD-10-CM | POA: Diagnosis not present

## 2016-12-16 DIAGNOSIS — I1311 Hypertensive heart and chronic kidney disease without heart failure, with stage 5 chronic kidney disease, or end stage renal disease: Secondary | ICD-10-CM | POA: Diagnosis not present

## 2016-12-17 DIAGNOSIS — I471 Supraventricular tachycardia: Secondary | ICD-10-CM | POA: Diagnosis not present

## 2016-12-17 DIAGNOSIS — D631 Anemia in chronic kidney disease: Secondary | ICD-10-CM | POA: Diagnosis not present

## 2016-12-17 DIAGNOSIS — I1311 Hypertensive heart and chronic kidney disease without heart failure, with stage 5 chronic kidney disease, or end stage renal disease: Secondary | ICD-10-CM | POA: Diagnosis not present

## 2016-12-17 DIAGNOSIS — I2510 Atherosclerotic heart disease of native coronary artery without angina pectoris: Secondary | ICD-10-CM | POA: Diagnosis not present

## 2016-12-17 DIAGNOSIS — I480 Paroxysmal atrial fibrillation: Secondary | ICD-10-CM | POA: Diagnosis not present

## 2016-12-17 DIAGNOSIS — N186 End stage renal disease: Secondary | ICD-10-CM | POA: Diagnosis not present

## 2016-12-18 DIAGNOSIS — N186 End stage renal disease: Secondary | ICD-10-CM | POA: Diagnosis not present

## 2016-12-18 DIAGNOSIS — D631 Anemia in chronic kidney disease: Secondary | ICD-10-CM | POA: Diagnosis not present

## 2016-12-18 DIAGNOSIS — I2510 Atherosclerotic heart disease of native coronary artery without angina pectoris: Secondary | ICD-10-CM | POA: Diagnosis not present

## 2016-12-18 DIAGNOSIS — I1311 Hypertensive heart and chronic kidney disease without heart failure, with stage 5 chronic kidney disease, or end stage renal disease: Secondary | ICD-10-CM | POA: Diagnosis not present

## 2016-12-18 DIAGNOSIS — I480 Paroxysmal atrial fibrillation: Secondary | ICD-10-CM | POA: Diagnosis not present

## 2016-12-18 DIAGNOSIS — I471 Supraventricular tachycardia: Secondary | ICD-10-CM | POA: Diagnosis not present

## 2016-12-20 DIAGNOSIS — I480 Paroxysmal atrial fibrillation: Secondary | ICD-10-CM | POA: Diagnosis not present

## 2016-12-20 DIAGNOSIS — B372 Candidiasis of skin and nail: Secondary | ICD-10-CM | POA: Diagnosis not present

## 2016-12-20 DIAGNOSIS — M109 Gout, unspecified: Secondary | ICD-10-CM | POA: Diagnosis not present

## 2016-12-20 DIAGNOSIS — Z7982 Long term (current) use of aspirin: Secondary | ICD-10-CM | POA: Diagnosis not present

## 2016-12-20 DIAGNOSIS — I340 Nonrheumatic mitral (valve) insufficiency: Secondary | ICD-10-CM | POA: Diagnosis not present

## 2016-12-20 DIAGNOSIS — I471 Supraventricular tachycardia: Secondary | ICD-10-CM | POA: Diagnosis not present

## 2016-12-20 DIAGNOSIS — Z9181 History of falling: Secondary | ICD-10-CM | POA: Diagnosis not present

## 2016-12-20 DIAGNOSIS — D631 Anemia in chronic kidney disease: Secondary | ICD-10-CM | POA: Diagnosis not present

## 2016-12-20 DIAGNOSIS — E039 Hypothyroidism, unspecified: Secondary | ICD-10-CM | POA: Diagnosis not present

## 2016-12-20 DIAGNOSIS — I361 Nonrheumatic tricuspid (valve) insufficiency: Secondary | ICD-10-CM | POA: Diagnosis not present

## 2016-12-20 DIAGNOSIS — F329 Major depressive disorder, single episode, unspecified: Secondary | ICD-10-CM | POA: Diagnosis not present

## 2016-12-20 DIAGNOSIS — I1311 Hypertensive heart and chronic kidney disease without heart failure, with stage 5 chronic kidney disease, or end stage renal disease: Secondary | ICD-10-CM | POA: Diagnosis not present

## 2016-12-20 DIAGNOSIS — N186 End stage renal disease: Secondary | ICD-10-CM | POA: Diagnosis not present

## 2016-12-20 DIAGNOSIS — E785 Hyperlipidemia, unspecified: Secondary | ICD-10-CM | POA: Diagnosis not present

## 2016-12-20 DIAGNOSIS — I2510 Atherosclerotic heart disease of native coronary artery without angina pectoris: Secondary | ICD-10-CM | POA: Diagnosis not present

## 2016-12-20 DIAGNOSIS — E559 Vitamin D deficiency, unspecified: Secondary | ICD-10-CM | POA: Diagnosis not present

## 2016-12-20 DIAGNOSIS — Z681 Body mass index (BMI) 19 or less, adult: Secondary | ICD-10-CM | POA: Diagnosis not present

## 2016-12-21 DIAGNOSIS — I480 Paroxysmal atrial fibrillation: Secondary | ICD-10-CM | POA: Diagnosis not present

## 2016-12-21 DIAGNOSIS — I471 Supraventricular tachycardia: Secondary | ICD-10-CM | POA: Diagnosis not present

## 2016-12-21 DIAGNOSIS — N186 End stage renal disease: Secondary | ICD-10-CM | POA: Diagnosis not present

## 2016-12-21 DIAGNOSIS — I1311 Hypertensive heart and chronic kidney disease without heart failure, with stage 5 chronic kidney disease, or end stage renal disease: Secondary | ICD-10-CM | POA: Diagnosis not present

## 2016-12-21 DIAGNOSIS — I2510 Atherosclerotic heart disease of native coronary artery without angina pectoris: Secondary | ICD-10-CM | POA: Diagnosis not present

## 2016-12-21 DIAGNOSIS — D631 Anemia in chronic kidney disease: Secondary | ICD-10-CM | POA: Diagnosis not present

## 2016-12-22 DIAGNOSIS — I471 Supraventricular tachycardia: Secondary | ICD-10-CM | POA: Diagnosis not present

## 2016-12-22 DIAGNOSIS — I2510 Atherosclerotic heart disease of native coronary artery without angina pectoris: Secondary | ICD-10-CM | POA: Diagnosis not present

## 2016-12-22 DIAGNOSIS — N186 End stage renal disease: Secondary | ICD-10-CM | POA: Diagnosis not present

## 2016-12-22 DIAGNOSIS — D631 Anemia in chronic kidney disease: Secondary | ICD-10-CM | POA: Diagnosis not present

## 2016-12-22 DIAGNOSIS — I480 Paroxysmal atrial fibrillation: Secondary | ICD-10-CM | POA: Diagnosis not present

## 2016-12-22 DIAGNOSIS — I1311 Hypertensive heart and chronic kidney disease without heart failure, with stage 5 chronic kidney disease, or end stage renal disease: Secondary | ICD-10-CM | POA: Diagnosis not present

## 2016-12-23 DIAGNOSIS — N186 End stage renal disease: Secondary | ICD-10-CM | POA: Diagnosis not present

## 2016-12-23 DIAGNOSIS — D631 Anemia in chronic kidney disease: Secondary | ICD-10-CM | POA: Diagnosis not present

## 2016-12-23 DIAGNOSIS — I471 Supraventricular tachycardia: Secondary | ICD-10-CM | POA: Diagnosis not present

## 2016-12-23 DIAGNOSIS — I2510 Atherosclerotic heart disease of native coronary artery without angina pectoris: Secondary | ICD-10-CM | POA: Diagnosis not present

## 2016-12-23 DIAGNOSIS — I1311 Hypertensive heart and chronic kidney disease without heart failure, with stage 5 chronic kidney disease, or end stage renal disease: Secondary | ICD-10-CM | POA: Diagnosis not present

## 2016-12-23 DIAGNOSIS — I480 Paroxysmal atrial fibrillation: Secondary | ICD-10-CM | POA: Diagnosis not present

## 2016-12-24 DIAGNOSIS — D631 Anemia in chronic kidney disease: Secondary | ICD-10-CM | POA: Diagnosis not present

## 2016-12-24 DIAGNOSIS — I2510 Atherosclerotic heart disease of native coronary artery without angina pectoris: Secondary | ICD-10-CM | POA: Diagnosis not present

## 2016-12-24 DIAGNOSIS — N186 End stage renal disease: Secondary | ICD-10-CM | POA: Diagnosis not present

## 2016-12-24 DIAGNOSIS — I471 Supraventricular tachycardia: Secondary | ICD-10-CM | POA: Diagnosis not present

## 2016-12-24 DIAGNOSIS — I1311 Hypertensive heart and chronic kidney disease without heart failure, with stage 5 chronic kidney disease, or end stage renal disease: Secondary | ICD-10-CM | POA: Diagnosis not present

## 2016-12-24 DIAGNOSIS — I480 Paroxysmal atrial fibrillation: Secondary | ICD-10-CM | POA: Diagnosis not present

## 2016-12-25 DIAGNOSIS — I480 Paroxysmal atrial fibrillation: Secondary | ICD-10-CM | POA: Diagnosis not present

## 2016-12-25 DIAGNOSIS — I1311 Hypertensive heart and chronic kidney disease without heart failure, with stage 5 chronic kidney disease, or end stage renal disease: Secondary | ICD-10-CM | POA: Diagnosis not present

## 2016-12-25 DIAGNOSIS — D631 Anemia in chronic kidney disease: Secondary | ICD-10-CM | POA: Diagnosis not present

## 2016-12-25 DIAGNOSIS — I471 Supraventricular tachycardia: Secondary | ICD-10-CM | POA: Diagnosis not present

## 2016-12-25 DIAGNOSIS — N186 End stage renal disease: Secondary | ICD-10-CM | POA: Diagnosis not present

## 2016-12-25 DIAGNOSIS — I2510 Atherosclerotic heart disease of native coronary artery without angina pectoris: Secondary | ICD-10-CM | POA: Diagnosis not present

## 2016-12-28 DIAGNOSIS — I471 Supraventricular tachycardia: Secondary | ICD-10-CM | POA: Diagnosis not present

## 2016-12-28 DIAGNOSIS — I1311 Hypertensive heart and chronic kidney disease without heart failure, with stage 5 chronic kidney disease, or end stage renal disease: Secondary | ICD-10-CM | POA: Diagnosis not present

## 2016-12-28 DIAGNOSIS — D631 Anemia in chronic kidney disease: Secondary | ICD-10-CM | POA: Diagnosis not present

## 2016-12-28 DIAGNOSIS — N186 End stage renal disease: Secondary | ICD-10-CM | POA: Diagnosis not present

## 2016-12-28 DIAGNOSIS — I2510 Atherosclerotic heart disease of native coronary artery without angina pectoris: Secondary | ICD-10-CM | POA: Diagnosis not present

## 2016-12-28 DIAGNOSIS — I480 Paroxysmal atrial fibrillation: Secondary | ICD-10-CM | POA: Diagnosis not present

## 2016-12-29 DIAGNOSIS — D631 Anemia in chronic kidney disease: Secondary | ICD-10-CM | POA: Diagnosis not present

## 2016-12-29 DIAGNOSIS — I1311 Hypertensive heart and chronic kidney disease without heart failure, with stage 5 chronic kidney disease, or end stage renal disease: Secondary | ICD-10-CM | POA: Diagnosis not present

## 2016-12-29 DIAGNOSIS — I480 Paroxysmal atrial fibrillation: Secondary | ICD-10-CM | POA: Diagnosis not present

## 2016-12-29 DIAGNOSIS — I471 Supraventricular tachycardia: Secondary | ICD-10-CM | POA: Diagnosis not present

## 2016-12-29 DIAGNOSIS — N186 End stage renal disease: Secondary | ICD-10-CM | POA: Diagnosis not present

## 2016-12-29 DIAGNOSIS — I2510 Atherosclerotic heart disease of native coronary artery without angina pectoris: Secondary | ICD-10-CM | POA: Diagnosis not present

## 2016-12-30 DIAGNOSIS — I1311 Hypertensive heart and chronic kidney disease without heart failure, with stage 5 chronic kidney disease, or end stage renal disease: Secondary | ICD-10-CM | POA: Diagnosis not present

## 2016-12-30 DIAGNOSIS — N186 End stage renal disease: Secondary | ICD-10-CM | POA: Diagnosis not present

## 2016-12-30 DIAGNOSIS — I471 Supraventricular tachycardia: Secondary | ICD-10-CM | POA: Diagnosis not present

## 2016-12-30 DIAGNOSIS — I2510 Atherosclerotic heart disease of native coronary artery without angina pectoris: Secondary | ICD-10-CM | POA: Diagnosis not present

## 2016-12-30 DIAGNOSIS — I480 Paroxysmal atrial fibrillation: Secondary | ICD-10-CM | POA: Diagnosis not present

## 2016-12-30 DIAGNOSIS — D631 Anemia in chronic kidney disease: Secondary | ICD-10-CM | POA: Diagnosis not present

## 2016-12-31 DIAGNOSIS — D631 Anemia in chronic kidney disease: Secondary | ICD-10-CM | POA: Diagnosis not present

## 2016-12-31 DIAGNOSIS — I480 Paroxysmal atrial fibrillation: Secondary | ICD-10-CM | POA: Diagnosis not present

## 2016-12-31 DIAGNOSIS — N186 End stage renal disease: Secondary | ICD-10-CM | POA: Diagnosis not present

## 2016-12-31 DIAGNOSIS — I1311 Hypertensive heart and chronic kidney disease without heart failure, with stage 5 chronic kidney disease, or end stage renal disease: Secondary | ICD-10-CM | POA: Diagnosis not present

## 2016-12-31 DIAGNOSIS — I471 Supraventricular tachycardia: Secondary | ICD-10-CM | POA: Diagnosis not present

## 2016-12-31 DIAGNOSIS — I2510 Atherosclerotic heart disease of native coronary artery without angina pectoris: Secondary | ICD-10-CM | POA: Diagnosis not present

## 2017-01-01 DIAGNOSIS — I480 Paroxysmal atrial fibrillation: Secondary | ICD-10-CM | POA: Diagnosis not present

## 2017-01-01 DIAGNOSIS — N186 End stage renal disease: Secondary | ICD-10-CM | POA: Diagnosis not present

## 2017-01-01 DIAGNOSIS — D631 Anemia in chronic kidney disease: Secondary | ICD-10-CM | POA: Diagnosis not present

## 2017-01-01 DIAGNOSIS — I2510 Atherosclerotic heart disease of native coronary artery without angina pectoris: Secondary | ICD-10-CM | POA: Diagnosis not present

## 2017-01-01 DIAGNOSIS — I1311 Hypertensive heart and chronic kidney disease without heart failure, with stage 5 chronic kidney disease, or end stage renal disease: Secondary | ICD-10-CM | POA: Diagnosis not present

## 2017-01-01 DIAGNOSIS — I471 Supraventricular tachycardia: Secondary | ICD-10-CM | POA: Diagnosis not present

## 2017-01-04 DIAGNOSIS — N186 End stage renal disease: Secondary | ICD-10-CM | POA: Diagnosis not present

## 2017-01-04 DIAGNOSIS — I471 Supraventricular tachycardia: Secondary | ICD-10-CM | POA: Diagnosis not present

## 2017-01-04 DIAGNOSIS — I2510 Atherosclerotic heart disease of native coronary artery without angina pectoris: Secondary | ICD-10-CM | POA: Diagnosis not present

## 2017-01-04 DIAGNOSIS — I480 Paroxysmal atrial fibrillation: Secondary | ICD-10-CM | POA: Diagnosis not present

## 2017-01-04 DIAGNOSIS — I1311 Hypertensive heart and chronic kidney disease without heart failure, with stage 5 chronic kidney disease, or end stage renal disease: Secondary | ICD-10-CM | POA: Diagnosis not present

## 2017-01-04 DIAGNOSIS — D631 Anemia in chronic kidney disease: Secondary | ICD-10-CM | POA: Diagnosis not present

## 2017-01-05 DIAGNOSIS — N186 End stage renal disease: Secondary | ICD-10-CM | POA: Diagnosis not present

## 2017-01-05 DIAGNOSIS — I1311 Hypertensive heart and chronic kidney disease without heart failure, with stage 5 chronic kidney disease, or end stage renal disease: Secondary | ICD-10-CM | POA: Diagnosis not present

## 2017-01-05 DIAGNOSIS — D631 Anemia in chronic kidney disease: Secondary | ICD-10-CM | POA: Diagnosis not present

## 2017-01-05 DIAGNOSIS — I471 Supraventricular tachycardia: Secondary | ICD-10-CM | POA: Diagnosis not present

## 2017-01-05 DIAGNOSIS — I2510 Atherosclerotic heart disease of native coronary artery without angina pectoris: Secondary | ICD-10-CM | POA: Diagnosis not present

## 2017-01-05 DIAGNOSIS — I480 Paroxysmal atrial fibrillation: Secondary | ICD-10-CM | POA: Diagnosis not present

## 2017-01-06 DIAGNOSIS — D631 Anemia in chronic kidney disease: Secondary | ICD-10-CM | POA: Diagnosis not present

## 2017-01-06 DIAGNOSIS — I480 Paroxysmal atrial fibrillation: Secondary | ICD-10-CM | POA: Diagnosis not present

## 2017-01-06 DIAGNOSIS — N186 End stage renal disease: Secondary | ICD-10-CM | POA: Diagnosis not present

## 2017-01-06 DIAGNOSIS — I2510 Atherosclerotic heart disease of native coronary artery without angina pectoris: Secondary | ICD-10-CM | POA: Diagnosis not present

## 2017-01-06 DIAGNOSIS — I471 Supraventricular tachycardia: Secondary | ICD-10-CM | POA: Diagnosis not present

## 2017-01-06 DIAGNOSIS — I1311 Hypertensive heart and chronic kidney disease without heart failure, with stage 5 chronic kidney disease, or end stage renal disease: Secondary | ICD-10-CM | POA: Diagnosis not present

## 2017-01-07 DIAGNOSIS — I1311 Hypertensive heart and chronic kidney disease without heart failure, with stage 5 chronic kidney disease, or end stage renal disease: Secondary | ICD-10-CM | POA: Diagnosis not present

## 2017-01-07 DIAGNOSIS — N186 End stage renal disease: Secondary | ICD-10-CM | POA: Diagnosis not present

## 2017-01-07 DIAGNOSIS — D631 Anemia in chronic kidney disease: Secondary | ICD-10-CM | POA: Diagnosis not present

## 2017-01-07 DIAGNOSIS — I480 Paroxysmal atrial fibrillation: Secondary | ICD-10-CM | POA: Diagnosis not present

## 2017-01-07 DIAGNOSIS — I471 Supraventricular tachycardia: Secondary | ICD-10-CM | POA: Diagnosis not present

## 2017-01-07 DIAGNOSIS — I2510 Atherosclerotic heart disease of native coronary artery without angina pectoris: Secondary | ICD-10-CM | POA: Diagnosis not present

## 2017-01-08 DIAGNOSIS — I1311 Hypertensive heart and chronic kidney disease without heart failure, with stage 5 chronic kidney disease, or end stage renal disease: Secondary | ICD-10-CM | POA: Diagnosis not present

## 2017-01-08 DIAGNOSIS — N186 End stage renal disease: Secondary | ICD-10-CM | POA: Diagnosis not present

## 2017-01-08 DIAGNOSIS — I471 Supraventricular tachycardia: Secondary | ICD-10-CM | POA: Diagnosis not present

## 2017-01-08 DIAGNOSIS — D631 Anemia in chronic kidney disease: Secondary | ICD-10-CM | POA: Diagnosis not present

## 2017-01-08 DIAGNOSIS — I2510 Atherosclerotic heart disease of native coronary artery without angina pectoris: Secondary | ICD-10-CM | POA: Diagnosis not present

## 2017-01-08 DIAGNOSIS — I480 Paroxysmal atrial fibrillation: Secondary | ICD-10-CM | POA: Diagnosis not present

## 2017-01-11 DIAGNOSIS — D631 Anemia in chronic kidney disease: Secondary | ICD-10-CM | POA: Diagnosis not present

## 2017-01-11 DIAGNOSIS — I471 Supraventricular tachycardia: Secondary | ICD-10-CM | POA: Diagnosis not present

## 2017-01-11 DIAGNOSIS — I480 Paroxysmal atrial fibrillation: Secondary | ICD-10-CM | POA: Diagnosis not present

## 2017-01-11 DIAGNOSIS — I1311 Hypertensive heart and chronic kidney disease without heart failure, with stage 5 chronic kidney disease, or end stage renal disease: Secondary | ICD-10-CM | POA: Diagnosis not present

## 2017-01-11 DIAGNOSIS — I2510 Atherosclerotic heart disease of native coronary artery without angina pectoris: Secondary | ICD-10-CM | POA: Diagnosis not present

## 2017-01-11 DIAGNOSIS — N186 End stage renal disease: Secondary | ICD-10-CM | POA: Diagnosis not present

## 2017-01-12 DIAGNOSIS — I471 Supraventricular tachycardia: Secondary | ICD-10-CM | POA: Diagnosis not present

## 2017-01-12 DIAGNOSIS — D631 Anemia in chronic kidney disease: Secondary | ICD-10-CM | POA: Diagnosis not present

## 2017-01-12 DIAGNOSIS — I2510 Atherosclerotic heart disease of native coronary artery without angina pectoris: Secondary | ICD-10-CM | POA: Diagnosis not present

## 2017-01-12 DIAGNOSIS — N186 End stage renal disease: Secondary | ICD-10-CM | POA: Diagnosis not present

## 2017-01-12 DIAGNOSIS — I480 Paroxysmal atrial fibrillation: Secondary | ICD-10-CM | POA: Diagnosis not present

## 2017-01-12 DIAGNOSIS — I1311 Hypertensive heart and chronic kidney disease without heart failure, with stage 5 chronic kidney disease, or end stage renal disease: Secondary | ICD-10-CM | POA: Diagnosis not present

## 2017-01-13 DIAGNOSIS — I471 Supraventricular tachycardia: Secondary | ICD-10-CM | POA: Diagnosis not present

## 2017-01-13 DIAGNOSIS — D631 Anemia in chronic kidney disease: Secondary | ICD-10-CM | POA: Diagnosis not present

## 2017-01-13 DIAGNOSIS — I2510 Atherosclerotic heart disease of native coronary artery without angina pectoris: Secondary | ICD-10-CM | POA: Diagnosis not present

## 2017-01-13 DIAGNOSIS — N186 End stage renal disease: Secondary | ICD-10-CM | POA: Diagnosis not present

## 2017-01-13 DIAGNOSIS — I1311 Hypertensive heart and chronic kidney disease without heart failure, with stage 5 chronic kidney disease, or end stage renal disease: Secondary | ICD-10-CM | POA: Diagnosis not present

## 2017-01-13 DIAGNOSIS — I480 Paroxysmal atrial fibrillation: Secondary | ICD-10-CM | POA: Diagnosis not present

## 2017-01-14 DIAGNOSIS — D631 Anemia in chronic kidney disease: Secondary | ICD-10-CM | POA: Diagnosis not present

## 2017-01-14 DIAGNOSIS — N186 End stage renal disease: Secondary | ICD-10-CM | POA: Diagnosis not present

## 2017-01-14 DIAGNOSIS — I2510 Atherosclerotic heart disease of native coronary artery without angina pectoris: Secondary | ICD-10-CM | POA: Diagnosis not present

## 2017-01-14 DIAGNOSIS — I480 Paroxysmal atrial fibrillation: Secondary | ICD-10-CM | POA: Diagnosis not present

## 2017-01-14 DIAGNOSIS — I1311 Hypertensive heart and chronic kidney disease without heart failure, with stage 5 chronic kidney disease, or end stage renal disease: Secondary | ICD-10-CM | POA: Diagnosis not present

## 2017-01-14 DIAGNOSIS — I471 Supraventricular tachycardia: Secondary | ICD-10-CM | POA: Diagnosis not present

## 2017-01-15 DIAGNOSIS — I1311 Hypertensive heart and chronic kidney disease without heart failure, with stage 5 chronic kidney disease, or end stage renal disease: Secondary | ICD-10-CM | POA: Diagnosis not present

## 2017-01-15 DIAGNOSIS — I2510 Atherosclerotic heart disease of native coronary artery without angina pectoris: Secondary | ICD-10-CM | POA: Diagnosis not present

## 2017-01-15 DIAGNOSIS — I480 Paroxysmal atrial fibrillation: Secondary | ICD-10-CM | POA: Diagnosis not present

## 2017-01-15 DIAGNOSIS — D631 Anemia in chronic kidney disease: Secondary | ICD-10-CM | POA: Diagnosis not present

## 2017-01-15 DIAGNOSIS — N186 End stage renal disease: Secondary | ICD-10-CM | POA: Diagnosis not present

## 2017-01-15 DIAGNOSIS — I471 Supraventricular tachycardia: Secondary | ICD-10-CM | POA: Diagnosis not present

## 2017-01-18 DIAGNOSIS — I2510 Atherosclerotic heart disease of native coronary artery without angina pectoris: Secondary | ICD-10-CM | POA: Diagnosis not present

## 2017-01-18 DIAGNOSIS — I480 Paroxysmal atrial fibrillation: Secondary | ICD-10-CM | POA: Diagnosis not present

## 2017-01-18 DIAGNOSIS — D631 Anemia in chronic kidney disease: Secondary | ICD-10-CM | POA: Diagnosis not present

## 2017-01-18 DIAGNOSIS — I471 Supraventricular tachycardia: Secondary | ICD-10-CM | POA: Diagnosis not present

## 2017-01-18 DIAGNOSIS — N186 End stage renal disease: Secondary | ICD-10-CM | POA: Diagnosis not present

## 2017-01-18 DIAGNOSIS — I1311 Hypertensive heart and chronic kidney disease without heart failure, with stage 5 chronic kidney disease, or end stage renal disease: Secondary | ICD-10-CM | POA: Diagnosis not present

## 2017-01-19 DIAGNOSIS — D631 Anemia in chronic kidney disease: Secondary | ICD-10-CM | POA: Diagnosis not present

## 2017-01-19 DIAGNOSIS — I480 Paroxysmal atrial fibrillation: Secondary | ICD-10-CM | POA: Diagnosis not present

## 2017-01-19 DIAGNOSIS — N186 End stage renal disease: Secondary | ICD-10-CM | POA: Diagnosis not present

## 2017-01-19 DIAGNOSIS — I1311 Hypertensive heart and chronic kidney disease without heart failure, with stage 5 chronic kidney disease, or end stage renal disease: Secondary | ICD-10-CM | POA: Diagnosis not present

## 2017-01-19 DIAGNOSIS — I471 Supraventricular tachycardia: Secondary | ICD-10-CM | POA: Diagnosis not present

## 2017-01-19 DIAGNOSIS — I2510 Atherosclerotic heart disease of native coronary artery without angina pectoris: Secondary | ICD-10-CM | POA: Diagnosis not present

## 2017-01-20 DIAGNOSIS — E785 Hyperlipidemia, unspecified: Secondary | ICD-10-CM | POA: Diagnosis not present

## 2017-01-20 DIAGNOSIS — F329 Major depressive disorder, single episode, unspecified: Secondary | ICD-10-CM | POA: Diagnosis not present

## 2017-01-20 DIAGNOSIS — I1311 Hypertensive heart and chronic kidney disease without heart failure, with stage 5 chronic kidney disease, or end stage renal disease: Secondary | ICD-10-CM | POA: Diagnosis not present

## 2017-01-20 DIAGNOSIS — N186 End stage renal disease: Secondary | ICD-10-CM | POA: Diagnosis not present

## 2017-01-20 DIAGNOSIS — Z7982 Long term (current) use of aspirin: Secondary | ICD-10-CM | POA: Diagnosis not present

## 2017-01-20 DIAGNOSIS — I2510 Atherosclerotic heart disease of native coronary artery without angina pectoris: Secondary | ICD-10-CM | POA: Diagnosis not present

## 2017-01-20 DIAGNOSIS — Z9981 Dependence on supplemental oxygen: Secondary | ICD-10-CM | POA: Diagnosis not present

## 2017-01-20 DIAGNOSIS — I361 Nonrheumatic tricuspid (valve) insufficiency: Secondary | ICD-10-CM | POA: Diagnosis not present

## 2017-01-20 DIAGNOSIS — M109 Gout, unspecified: Secondary | ICD-10-CM | POA: Diagnosis not present

## 2017-01-20 DIAGNOSIS — Z681 Body mass index (BMI) 19 or less, adult: Secondary | ICD-10-CM | POA: Diagnosis not present

## 2017-01-20 DIAGNOSIS — D631 Anemia in chronic kidney disease: Secondary | ICD-10-CM | POA: Diagnosis not present

## 2017-01-20 DIAGNOSIS — E039 Hypothyroidism, unspecified: Secondary | ICD-10-CM | POA: Diagnosis not present

## 2017-01-20 DIAGNOSIS — I471 Supraventricular tachycardia: Secondary | ICD-10-CM | POA: Diagnosis not present

## 2017-01-20 DIAGNOSIS — I340 Nonrheumatic mitral (valve) insufficiency: Secondary | ICD-10-CM | POA: Diagnosis not present

## 2017-01-20 DIAGNOSIS — Z9181 History of falling: Secondary | ICD-10-CM | POA: Diagnosis not present

## 2017-01-20 DIAGNOSIS — B372 Candidiasis of skin and nail: Secondary | ICD-10-CM | POA: Diagnosis not present

## 2017-01-20 DIAGNOSIS — I480 Paroxysmal atrial fibrillation: Secondary | ICD-10-CM | POA: Diagnosis not present

## 2017-01-20 DIAGNOSIS — E559 Vitamin D deficiency, unspecified: Secondary | ICD-10-CM | POA: Diagnosis not present

## 2017-01-21 DIAGNOSIS — Z7982 Long term (current) use of aspirin: Secondary | ICD-10-CM | POA: Diagnosis not present

## 2017-01-21 DIAGNOSIS — I1311 Hypertensive heart and chronic kidney disease without heart failure, with stage 5 chronic kidney disease, or end stage renal disease: Secondary | ICD-10-CM | POA: Diagnosis not present

## 2017-01-21 DIAGNOSIS — D638 Anemia in other chronic diseases classified elsewhere: Secondary | ICD-10-CM | POA: Diagnosis not present

## 2017-01-21 DIAGNOSIS — N184 Chronic kidney disease, stage 4 (severe): Secondary | ICD-10-CM | POA: Diagnosis not present

## 2017-01-21 DIAGNOSIS — I471 Supraventricular tachycardia: Secondary | ICD-10-CM | POA: Diagnosis not present

## 2017-01-21 DIAGNOSIS — F320 Major depressive disorder, single episode, mild: Secondary | ICD-10-CM | POA: Diagnosis not present

## 2017-01-21 DIAGNOSIS — E038 Other specified hypothyroidism: Secondary | ICD-10-CM | POA: Diagnosis not present

## 2017-01-21 DIAGNOSIS — D631 Anemia in chronic kidney disease: Secondary | ICD-10-CM | POA: Diagnosis not present

## 2017-01-21 DIAGNOSIS — N186 End stage renal disease: Secondary | ICD-10-CM | POA: Diagnosis not present

## 2017-01-21 DIAGNOSIS — I2510 Atherosclerotic heart disease of native coronary artery without angina pectoris: Secondary | ICD-10-CM | POA: Diagnosis not present

## 2017-01-22 DIAGNOSIS — I2510 Atherosclerotic heart disease of native coronary artery without angina pectoris: Secondary | ICD-10-CM | POA: Diagnosis not present

## 2017-01-22 DIAGNOSIS — Z7982 Long term (current) use of aspirin: Secondary | ICD-10-CM | POA: Diagnosis not present

## 2017-01-22 DIAGNOSIS — N186 End stage renal disease: Secondary | ICD-10-CM | POA: Diagnosis not present

## 2017-01-22 DIAGNOSIS — I1311 Hypertensive heart and chronic kidney disease without heart failure, with stage 5 chronic kidney disease, or end stage renal disease: Secondary | ICD-10-CM | POA: Diagnosis not present

## 2017-01-22 DIAGNOSIS — I471 Supraventricular tachycardia: Secondary | ICD-10-CM | POA: Diagnosis not present

## 2017-01-22 DIAGNOSIS — D631 Anemia in chronic kidney disease: Secondary | ICD-10-CM | POA: Diagnosis not present

## 2017-01-25 DIAGNOSIS — I2510 Atherosclerotic heart disease of native coronary artery without angina pectoris: Secondary | ICD-10-CM | POA: Diagnosis not present

## 2017-01-25 DIAGNOSIS — Z7982 Long term (current) use of aspirin: Secondary | ICD-10-CM | POA: Diagnosis not present

## 2017-01-25 DIAGNOSIS — D631 Anemia in chronic kidney disease: Secondary | ICD-10-CM | POA: Diagnosis not present

## 2017-01-25 DIAGNOSIS — I471 Supraventricular tachycardia: Secondary | ICD-10-CM | POA: Diagnosis not present

## 2017-01-25 DIAGNOSIS — N186 End stage renal disease: Secondary | ICD-10-CM | POA: Diagnosis not present

## 2017-01-25 DIAGNOSIS — I1311 Hypertensive heart and chronic kidney disease without heart failure, with stage 5 chronic kidney disease, or end stage renal disease: Secondary | ICD-10-CM | POA: Diagnosis not present

## 2017-01-26 DIAGNOSIS — D631 Anemia in chronic kidney disease: Secondary | ICD-10-CM | POA: Diagnosis not present

## 2017-01-26 DIAGNOSIS — N186 End stage renal disease: Secondary | ICD-10-CM | POA: Diagnosis not present

## 2017-01-26 DIAGNOSIS — Z7982 Long term (current) use of aspirin: Secondary | ICD-10-CM | POA: Diagnosis not present

## 2017-01-26 DIAGNOSIS — I2510 Atherosclerotic heart disease of native coronary artery without angina pectoris: Secondary | ICD-10-CM | POA: Diagnosis not present

## 2017-01-26 DIAGNOSIS — I1311 Hypertensive heart and chronic kidney disease without heart failure, with stage 5 chronic kidney disease, or end stage renal disease: Secondary | ICD-10-CM | POA: Diagnosis not present

## 2017-01-26 DIAGNOSIS — I471 Supraventricular tachycardia: Secondary | ICD-10-CM | POA: Diagnosis not present

## 2017-01-27 DIAGNOSIS — I471 Supraventricular tachycardia: Secondary | ICD-10-CM | POA: Diagnosis not present

## 2017-01-27 DIAGNOSIS — I1311 Hypertensive heart and chronic kidney disease without heart failure, with stage 5 chronic kidney disease, or end stage renal disease: Secondary | ICD-10-CM | POA: Diagnosis not present

## 2017-01-27 DIAGNOSIS — I2510 Atherosclerotic heart disease of native coronary artery without angina pectoris: Secondary | ICD-10-CM | POA: Diagnosis not present

## 2017-01-27 DIAGNOSIS — D631 Anemia in chronic kidney disease: Secondary | ICD-10-CM | POA: Diagnosis not present

## 2017-01-27 DIAGNOSIS — Z7982 Long term (current) use of aspirin: Secondary | ICD-10-CM | POA: Diagnosis not present

## 2017-01-27 DIAGNOSIS — N186 End stage renal disease: Secondary | ICD-10-CM | POA: Diagnosis not present

## 2017-01-28 DIAGNOSIS — Z7982 Long term (current) use of aspirin: Secondary | ICD-10-CM | POA: Diagnosis not present

## 2017-01-28 DIAGNOSIS — I1311 Hypertensive heart and chronic kidney disease without heart failure, with stage 5 chronic kidney disease, or end stage renal disease: Secondary | ICD-10-CM | POA: Diagnosis not present

## 2017-01-28 DIAGNOSIS — N186 End stage renal disease: Secondary | ICD-10-CM | POA: Diagnosis not present

## 2017-01-28 DIAGNOSIS — I471 Supraventricular tachycardia: Secondary | ICD-10-CM | POA: Diagnosis not present

## 2017-01-28 DIAGNOSIS — I2510 Atherosclerotic heart disease of native coronary artery without angina pectoris: Secondary | ICD-10-CM | POA: Diagnosis not present

## 2017-01-28 DIAGNOSIS — D631 Anemia in chronic kidney disease: Secondary | ICD-10-CM | POA: Diagnosis not present

## 2017-01-29 DIAGNOSIS — I471 Supraventricular tachycardia: Secondary | ICD-10-CM | POA: Diagnosis not present

## 2017-01-29 DIAGNOSIS — Z7982 Long term (current) use of aspirin: Secondary | ICD-10-CM | POA: Diagnosis not present

## 2017-01-29 DIAGNOSIS — I2510 Atherosclerotic heart disease of native coronary artery without angina pectoris: Secondary | ICD-10-CM | POA: Diagnosis not present

## 2017-01-29 DIAGNOSIS — N186 End stage renal disease: Secondary | ICD-10-CM | POA: Diagnosis not present

## 2017-01-29 DIAGNOSIS — I1311 Hypertensive heart and chronic kidney disease without heart failure, with stage 5 chronic kidney disease, or end stage renal disease: Secondary | ICD-10-CM | POA: Diagnosis not present

## 2017-01-29 DIAGNOSIS — D631 Anemia in chronic kidney disease: Secondary | ICD-10-CM | POA: Diagnosis not present

## 2017-02-01 DIAGNOSIS — I2510 Atherosclerotic heart disease of native coronary artery without angina pectoris: Secondary | ICD-10-CM | POA: Diagnosis not present

## 2017-02-01 DIAGNOSIS — N186 End stage renal disease: Secondary | ICD-10-CM | POA: Diagnosis not present

## 2017-02-01 DIAGNOSIS — I1311 Hypertensive heart and chronic kidney disease without heart failure, with stage 5 chronic kidney disease, or end stage renal disease: Secondary | ICD-10-CM | POA: Diagnosis not present

## 2017-02-01 DIAGNOSIS — D631 Anemia in chronic kidney disease: Secondary | ICD-10-CM | POA: Diagnosis not present

## 2017-02-01 DIAGNOSIS — Z7982 Long term (current) use of aspirin: Secondary | ICD-10-CM | POA: Diagnosis not present

## 2017-02-01 DIAGNOSIS — I471 Supraventricular tachycardia: Secondary | ICD-10-CM | POA: Diagnosis not present

## 2017-02-02 DIAGNOSIS — N186 End stage renal disease: Secondary | ICD-10-CM | POA: Diagnosis not present

## 2017-02-02 DIAGNOSIS — I1311 Hypertensive heart and chronic kidney disease without heart failure, with stage 5 chronic kidney disease, or end stage renal disease: Secondary | ICD-10-CM | POA: Diagnosis not present

## 2017-02-02 DIAGNOSIS — Z7982 Long term (current) use of aspirin: Secondary | ICD-10-CM | POA: Diagnosis not present

## 2017-02-02 DIAGNOSIS — I471 Supraventricular tachycardia: Secondary | ICD-10-CM | POA: Diagnosis not present

## 2017-02-02 DIAGNOSIS — D631 Anemia in chronic kidney disease: Secondary | ICD-10-CM | POA: Diagnosis not present

## 2017-02-02 DIAGNOSIS — I2510 Atherosclerotic heart disease of native coronary artery without angina pectoris: Secondary | ICD-10-CM | POA: Diagnosis not present

## 2017-02-03 DIAGNOSIS — Z7982 Long term (current) use of aspirin: Secondary | ICD-10-CM | POA: Diagnosis not present

## 2017-02-03 DIAGNOSIS — D631 Anemia in chronic kidney disease: Secondary | ICD-10-CM | POA: Diagnosis not present

## 2017-02-03 DIAGNOSIS — I2510 Atherosclerotic heart disease of native coronary artery without angina pectoris: Secondary | ICD-10-CM | POA: Diagnosis not present

## 2017-02-03 DIAGNOSIS — I1311 Hypertensive heart and chronic kidney disease without heart failure, with stage 5 chronic kidney disease, or end stage renal disease: Secondary | ICD-10-CM | POA: Diagnosis not present

## 2017-02-03 DIAGNOSIS — N186 End stage renal disease: Secondary | ICD-10-CM | POA: Diagnosis not present

## 2017-02-03 DIAGNOSIS — I471 Supraventricular tachycardia: Secondary | ICD-10-CM | POA: Diagnosis not present

## 2017-02-04 DIAGNOSIS — Z7982 Long term (current) use of aspirin: Secondary | ICD-10-CM | POA: Diagnosis not present

## 2017-02-04 DIAGNOSIS — I471 Supraventricular tachycardia: Secondary | ICD-10-CM | POA: Diagnosis not present

## 2017-02-04 DIAGNOSIS — I1311 Hypertensive heart and chronic kidney disease without heart failure, with stage 5 chronic kidney disease, or end stage renal disease: Secondary | ICD-10-CM | POA: Diagnosis not present

## 2017-02-04 DIAGNOSIS — D631 Anemia in chronic kidney disease: Secondary | ICD-10-CM | POA: Diagnosis not present

## 2017-02-04 DIAGNOSIS — I2510 Atherosclerotic heart disease of native coronary artery without angina pectoris: Secondary | ICD-10-CM | POA: Diagnosis not present

## 2017-02-04 DIAGNOSIS — N186 End stage renal disease: Secondary | ICD-10-CM | POA: Diagnosis not present

## 2017-02-05 DIAGNOSIS — N186 End stage renal disease: Secondary | ICD-10-CM | POA: Diagnosis not present

## 2017-02-05 DIAGNOSIS — I2510 Atherosclerotic heart disease of native coronary artery without angina pectoris: Secondary | ICD-10-CM | POA: Diagnosis not present

## 2017-02-05 DIAGNOSIS — I471 Supraventricular tachycardia: Secondary | ICD-10-CM | POA: Diagnosis not present

## 2017-02-05 DIAGNOSIS — I1311 Hypertensive heart and chronic kidney disease without heart failure, with stage 5 chronic kidney disease, or end stage renal disease: Secondary | ICD-10-CM | POA: Diagnosis not present

## 2017-02-05 DIAGNOSIS — D631 Anemia in chronic kidney disease: Secondary | ICD-10-CM | POA: Diagnosis not present

## 2017-02-05 DIAGNOSIS — Z7982 Long term (current) use of aspirin: Secondary | ICD-10-CM | POA: Diagnosis not present

## 2017-02-08 DIAGNOSIS — I471 Supraventricular tachycardia: Secondary | ICD-10-CM | POA: Diagnosis not present

## 2017-02-08 DIAGNOSIS — I2510 Atherosclerotic heart disease of native coronary artery without angina pectoris: Secondary | ICD-10-CM | POA: Diagnosis not present

## 2017-02-08 DIAGNOSIS — D631 Anemia in chronic kidney disease: Secondary | ICD-10-CM | POA: Diagnosis not present

## 2017-02-08 DIAGNOSIS — N186 End stage renal disease: Secondary | ICD-10-CM | POA: Diagnosis not present

## 2017-02-08 DIAGNOSIS — Z7982 Long term (current) use of aspirin: Secondary | ICD-10-CM | POA: Diagnosis not present

## 2017-02-08 DIAGNOSIS — I1311 Hypertensive heart and chronic kidney disease without heart failure, with stage 5 chronic kidney disease, or end stage renal disease: Secondary | ICD-10-CM | POA: Diagnosis not present

## 2017-02-09 DIAGNOSIS — I2510 Atherosclerotic heart disease of native coronary artery without angina pectoris: Secondary | ICD-10-CM | POA: Diagnosis not present

## 2017-02-09 DIAGNOSIS — I471 Supraventricular tachycardia: Secondary | ICD-10-CM | POA: Diagnosis not present

## 2017-02-09 DIAGNOSIS — D631 Anemia in chronic kidney disease: Secondary | ICD-10-CM | POA: Diagnosis not present

## 2017-02-09 DIAGNOSIS — N186 End stage renal disease: Secondary | ICD-10-CM | POA: Diagnosis not present

## 2017-02-09 DIAGNOSIS — Z7982 Long term (current) use of aspirin: Secondary | ICD-10-CM | POA: Diagnosis not present

## 2017-02-09 DIAGNOSIS — I1311 Hypertensive heart and chronic kidney disease without heart failure, with stage 5 chronic kidney disease, or end stage renal disease: Secondary | ICD-10-CM | POA: Diagnosis not present

## 2017-02-10 DIAGNOSIS — I471 Supraventricular tachycardia: Secondary | ICD-10-CM | POA: Diagnosis not present

## 2017-02-10 DIAGNOSIS — Z7982 Long term (current) use of aspirin: Secondary | ICD-10-CM | POA: Diagnosis not present

## 2017-02-10 DIAGNOSIS — I2510 Atherosclerotic heart disease of native coronary artery without angina pectoris: Secondary | ICD-10-CM | POA: Diagnosis not present

## 2017-02-10 DIAGNOSIS — N186 End stage renal disease: Secondary | ICD-10-CM | POA: Diagnosis not present

## 2017-02-10 DIAGNOSIS — D631 Anemia in chronic kidney disease: Secondary | ICD-10-CM | POA: Diagnosis not present

## 2017-02-10 DIAGNOSIS — I1311 Hypertensive heart and chronic kidney disease without heart failure, with stage 5 chronic kidney disease, or end stage renal disease: Secondary | ICD-10-CM | POA: Diagnosis not present

## 2017-02-11 DIAGNOSIS — I2510 Atherosclerotic heart disease of native coronary artery without angina pectoris: Secondary | ICD-10-CM | POA: Diagnosis not present

## 2017-02-11 DIAGNOSIS — I471 Supraventricular tachycardia: Secondary | ICD-10-CM | POA: Diagnosis not present

## 2017-02-11 DIAGNOSIS — D631 Anemia in chronic kidney disease: Secondary | ICD-10-CM | POA: Diagnosis not present

## 2017-02-11 DIAGNOSIS — Z7982 Long term (current) use of aspirin: Secondary | ICD-10-CM | POA: Diagnosis not present

## 2017-02-11 DIAGNOSIS — I1311 Hypertensive heart and chronic kidney disease without heart failure, with stage 5 chronic kidney disease, or end stage renal disease: Secondary | ICD-10-CM | POA: Diagnosis not present

## 2017-02-11 DIAGNOSIS — N186 End stage renal disease: Secondary | ICD-10-CM | POA: Diagnosis not present

## 2017-02-12 DIAGNOSIS — Z7982 Long term (current) use of aspirin: Secondary | ICD-10-CM | POA: Diagnosis not present

## 2017-02-12 DIAGNOSIS — D631 Anemia in chronic kidney disease: Secondary | ICD-10-CM | POA: Diagnosis not present

## 2017-02-12 DIAGNOSIS — I1311 Hypertensive heart and chronic kidney disease without heart failure, with stage 5 chronic kidney disease, or end stage renal disease: Secondary | ICD-10-CM | POA: Diagnosis not present

## 2017-02-12 DIAGNOSIS — I2510 Atherosclerotic heart disease of native coronary artery without angina pectoris: Secondary | ICD-10-CM | POA: Diagnosis not present

## 2017-02-12 DIAGNOSIS — N186 End stage renal disease: Secondary | ICD-10-CM | POA: Diagnosis not present

## 2017-02-12 DIAGNOSIS — I471 Supraventricular tachycardia: Secondary | ICD-10-CM | POA: Diagnosis not present

## 2017-02-15 DIAGNOSIS — Z7982 Long term (current) use of aspirin: Secondary | ICD-10-CM | POA: Diagnosis not present

## 2017-02-15 DIAGNOSIS — D631 Anemia in chronic kidney disease: Secondary | ICD-10-CM | POA: Diagnosis not present

## 2017-02-15 DIAGNOSIS — I2510 Atherosclerotic heart disease of native coronary artery without angina pectoris: Secondary | ICD-10-CM | POA: Diagnosis not present

## 2017-02-15 DIAGNOSIS — I1311 Hypertensive heart and chronic kidney disease without heart failure, with stage 5 chronic kidney disease, or end stage renal disease: Secondary | ICD-10-CM | POA: Diagnosis not present

## 2017-02-15 DIAGNOSIS — N186 End stage renal disease: Secondary | ICD-10-CM | POA: Diagnosis not present

## 2017-02-15 DIAGNOSIS — I471 Supraventricular tachycardia: Secondary | ICD-10-CM | POA: Diagnosis not present

## 2017-02-16 DIAGNOSIS — Z7982 Long term (current) use of aspirin: Secondary | ICD-10-CM | POA: Diagnosis not present

## 2017-02-16 DIAGNOSIS — N186 End stage renal disease: Secondary | ICD-10-CM | POA: Diagnosis not present

## 2017-02-16 DIAGNOSIS — I2510 Atherosclerotic heart disease of native coronary artery without angina pectoris: Secondary | ICD-10-CM | POA: Diagnosis not present

## 2017-02-16 DIAGNOSIS — D631 Anemia in chronic kidney disease: Secondary | ICD-10-CM | POA: Diagnosis not present

## 2017-02-16 DIAGNOSIS — I471 Supraventricular tachycardia: Secondary | ICD-10-CM | POA: Diagnosis not present

## 2017-02-16 DIAGNOSIS — I1311 Hypertensive heart and chronic kidney disease without heart failure, with stage 5 chronic kidney disease, or end stage renal disease: Secondary | ICD-10-CM | POA: Diagnosis not present

## 2017-02-17 DIAGNOSIS — Z7982 Long term (current) use of aspirin: Secondary | ICD-10-CM | POA: Diagnosis not present

## 2017-02-17 DIAGNOSIS — I2510 Atherosclerotic heart disease of native coronary artery without angina pectoris: Secondary | ICD-10-CM | POA: Diagnosis not present

## 2017-02-17 DIAGNOSIS — D631 Anemia in chronic kidney disease: Secondary | ICD-10-CM | POA: Diagnosis not present

## 2017-02-17 DIAGNOSIS — N186 End stage renal disease: Secondary | ICD-10-CM | POA: Diagnosis not present

## 2017-02-17 DIAGNOSIS — I471 Supraventricular tachycardia: Secondary | ICD-10-CM | POA: Diagnosis not present

## 2017-02-17 DIAGNOSIS — I1311 Hypertensive heart and chronic kidney disease without heart failure, with stage 5 chronic kidney disease, or end stage renal disease: Secondary | ICD-10-CM | POA: Diagnosis not present

## 2017-02-18 DIAGNOSIS — D631 Anemia in chronic kidney disease: Secondary | ICD-10-CM | POA: Diagnosis not present

## 2017-02-18 DIAGNOSIS — I2510 Atherosclerotic heart disease of native coronary artery without angina pectoris: Secondary | ICD-10-CM | POA: Diagnosis not present

## 2017-02-18 DIAGNOSIS — I471 Supraventricular tachycardia: Secondary | ICD-10-CM | POA: Diagnosis not present

## 2017-02-18 DIAGNOSIS — N186 End stage renal disease: Secondary | ICD-10-CM | POA: Diagnosis not present

## 2017-02-18 DIAGNOSIS — I1311 Hypertensive heart and chronic kidney disease without heart failure, with stage 5 chronic kidney disease, or end stage renal disease: Secondary | ICD-10-CM | POA: Diagnosis not present

## 2017-02-18 DIAGNOSIS — Z7982 Long term (current) use of aspirin: Secondary | ICD-10-CM | POA: Diagnosis not present

## 2017-02-19 DIAGNOSIS — N186 End stage renal disease: Secondary | ICD-10-CM | POA: Diagnosis not present

## 2017-02-19 DIAGNOSIS — I1311 Hypertensive heart and chronic kidney disease without heart failure, with stage 5 chronic kidney disease, or end stage renal disease: Secondary | ICD-10-CM | POA: Diagnosis not present

## 2017-02-19 DIAGNOSIS — I2510 Atherosclerotic heart disease of native coronary artery without angina pectoris: Secondary | ICD-10-CM | POA: Diagnosis not present

## 2017-02-19 DIAGNOSIS — I471 Supraventricular tachycardia: Secondary | ICD-10-CM | POA: Diagnosis not present

## 2017-02-19 DIAGNOSIS — D631 Anemia in chronic kidney disease: Secondary | ICD-10-CM | POA: Diagnosis not present

## 2017-02-19 DIAGNOSIS — Z7982 Long term (current) use of aspirin: Secondary | ICD-10-CM | POA: Diagnosis not present

## 2017-02-20 DIAGNOSIS — D631 Anemia in chronic kidney disease: Secondary | ICD-10-CM | POA: Diagnosis not present

## 2017-02-20 DIAGNOSIS — E785 Hyperlipidemia, unspecified: Secondary | ICD-10-CM | POA: Diagnosis not present

## 2017-02-20 DIAGNOSIS — I361 Nonrheumatic tricuspid (valve) insufficiency: Secondary | ICD-10-CM | POA: Diagnosis not present

## 2017-02-20 DIAGNOSIS — Z9181 History of falling: Secondary | ICD-10-CM | POA: Diagnosis not present

## 2017-02-20 DIAGNOSIS — N186 End stage renal disease: Secondary | ICD-10-CM | POA: Diagnosis not present

## 2017-02-20 DIAGNOSIS — I471 Supraventricular tachycardia: Secondary | ICD-10-CM | POA: Diagnosis not present

## 2017-02-20 DIAGNOSIS — I2510 Atherosclerotic heart disease of native coronary artery without angina pectoris: Secondary | ICD-10-CM | POA: Diagnosis not present

## 2017-02-20 DIAGNOSIS — Z9981 Dependence on supplemental oxygen: Secondary | ICD-10-CM | POA: Diagnosis not present

## 2017-02-20 DIAGNOSIS — B372 Candidiasis of skin and nail: Secondary | ICD-10-CM | POA: Diagnosis not present

## 2017-02-20 DIAGNOSIS — E039 Hypothyroidism, unspecified: Secondary | ICD-10-CM | POA: Diagnosis not present

## 2017-02-20 DIAGNOSIS — Z681 Body mass index (BMI) 19 or less, adult: Secondary | ICD-10-CM | POA: Diagnosis not present

## 2017-02-20 DIAGNOSIS — M109 Gout, unspecified: Secondary | ICD-10-CM | POA: Diagnosis not present

## 2017-02-20 DIAGNOSIS — Z7982 Long term (current) use of aspirin: Secondary | ICD-10-CM | POA: Diagnosis not present

## 2017-02-20 DIAGNOSIS — I480 Paroxysmal atrial fibrillation: Secondary | ICD-10-CM | POA: Diagnosis not present

## 2017-02-20 DIAGNOSIS — F329 Major depressive disorder, single episode, unspecified: Secondary | ICD-10-CM | POA: Diagnosis not present

## 2017-02-20 DIAGNOSIS — I340 Nonrheumatic mitral (valve) insufficiency: Secondary | ICD-10-CM | POA: Diagnosis not present

## 2017-02-20 DIAGNOSIS — I1311 Hypertensive heart and chronic kidney disease without heart failure, with stage 5 chronic kidney disease, or end stage renal disease: Secondary | ICD-10-CM | POA: Diagnosis not present

## 2017-02-20 DIAGNOSIS — E559 Vitamin D deficiency, unspecified: Secondary | ICD-10-CM | POA: Diagnosis not present

## 2017-02-22 DIAGNOSIS — I471 Supraventricular tachycardia: Secondary | ICD-10-CM | POA: Diagnosis not present

## 2017-02-22 DIAGNOSIS — I1311 Hypertensive heart and chronic kidney disease without heart failure, with stage 5 chronic kidney disease, or end stage renal disease: Secondary | ICD-10-CM | POA: Diagnosis not present

## 2017-02-22 DIAGNOSIS — N186 End stage renal disease: Secondary | ICD-10-CM | POA: Diagnosis not present

## 2017-02-22 DIAGNOSIS — I2510 Atherosclerotic heart disease of native coronary artery without angina pectoris: Secondary | ICD-10-CM | POA: Diagnosis not present

## 2017-02-22 DIAGNOSIS — D631 Anemia in chronic kidney disease: Secondary | ICD-10-CM | POA: Diagnosis not present

## 2017-02-22 DIAGNOSIS — Z7982 Long term (current) use of aspirin: Secondary | ICD-10-CM | POA: Diagnosis not present

## 2017-02-23 DIAGNOSIS — N186 End stage renal disease: Secondary | ICD-10-CM | POA: Diagnosis not present

## 2017-02-23 DIAGNOSIS — Z7982 Long term (current) use of aspirin: Secondary | ICD-10-CM | POA: Diagnosis not present

## 2017-02-23 DIAGNOSIS — D631 Anemia in chronic kidney disease: Secondary | ICD-10-CM | POA: Diagnosis not present

## 2017-02-23 DIAGNOSIS — I1311 Hypertensive heart and chronic kidney disease without heart failure, with stage 5 chronic kidney disease, or end stage renal disease: Secondary | ICD-10-CM | POA: Diagnosis not present

## 2017-02-23 DIAGNOSIS — I2510 Atherosclerotic heart disease of native coronary artery without angina pectoris: Secondary | ICD-10-CM | POA: Diagnosis not present

## 2017-02-23 DIAGNOSIS — I471 Supraventricular tachycardia: Secondary | ICD-10-CM | POA: Diagnosis not present

## 2017-02-24 DIAGNOSIS — I2510 Atherosclerotic heart disease of native coronary artery without angina pectoris: Secondary | ICD-10-CM | POA: Diagnosis not present

## 2017-02-24 DIAGNOSIS — D631 Anemia in chronic kidney disease: Secondary | ICD-10-CM | POA: Diagnosis not present

## 2017-02-24 DIAGNOSIS — I471 Supraventricular tachycardia: Secondary | ICD-10-CM | POA: Diagnosis not present

## 2017-02-24 DIAGNOSIS — Z7982 Long term (current) use of aspirin: Secondary | ICD-10-CM | POA: Diagnosis not present

## 2017-02-24 DIAGNOSIS — N186 End stage renal disease: Secondary | ICD-10-CM | POA: Diagnosis not present

## 2017-02-24 DIAGNOSIS — I1311 Hypertensive heart and chronic kidney disease without heart failure, with stage 5 chronic kidney disease, or end stage renal disease: Secondary | ICD-10-CM | POA: Diagnosis not present

## 2017-02-25 DIAGNOSIS — D631 Anemia in chronic kidney disease: Secondary | ICD-10-CM | POA: Diagnosis not present

## 2017-02-25 DIAGNOSIS — Z7982 Long term (current) use of aspirin: Secondary | ICD-10-CM | POA: Diagnosis not present

## 2017-02-25 DIAGNOSIS — N186 End stage renal disease: Secondary | ICD-10-CM | POA: Diagnosis not present

## 2017-02-25 DIAGNOSIS — I2510 Atherosclerotic heart disease of native coronary artery without angina pectoris: Secondary | ICD-10-CM | POA: Diagnosis not present

## 2017-02-25 DIAGNOSIS — I1311 Hypertensive heart and chronic kidney disease without heart failure, with stage 5 chronic kidney disease, or end stage renal disease: Secondary | ICD-10-CM | POA: Diagnosis not present

## 2017-02-25 DIAGNOSIS — I471 Supraventricular tachycardia: Secondary | ICD-10-CM | POA: Diagnosis not present

## 2017-02-26 DIAGNOSIS — I2510 Atherosclerotic heart disease of native coronary artery without angina pectoris: Secondary | ICD-10-CM | POA: Diagnosis not present

## 2017-02-26 DIAGNOSIS — Z7982 Long term (current) use of aspirin: Secondary | ICD-10-CM | POA: Diagnosis not present

## 2017-02-26 DIAGNOSIS — N186 End stage renal disease: Secondary | ICD-10-CM | POA: Diagnosis not present

## 2017-02-26 DIAGNOSIS — I1311 Hypertensive heart and chronic kidney disease without heart failure, with stage 5 chronic kidney disease, or end stage renal disease: Secondary | ICD-10-CM | POA: Diagnosis not present

## 2017-02-26 DIAGNOSIS — I471 Supraventricular tachycardia: Secondary | ICD-10-CM | POA: Diagnosis not present

## 2017-02-26 DIAGNOSIS — D631 Anemia in chronic kidney disease: Secondary | ICD-10-CM | POA: Diagnosis not present

## 2017-03-01 DIAGNOSIS — N186 End stage renal disease: Secondary | ICD-10-CM | POA: Diagnosis not present

## 2017-03-01 DIAGNOSIS — I2510 Atherosclerotic heart disease of native coronary artery without angina pectoris: Secondary | ICD-10-CM | POA: Diagnosis not present

## 2017-03-01 DIAGNOSIS — I1311 Hypertensive heart and chronic kidney disease without heart failure, with stage 5 chronic kidney disease, or end stage renal disease: Secondary | ICD-10-CM | POA: Diagnosis not present

## 2017-03-01 DIAGNOSIS — I471 Supraventricular tachycardia: Secondary | ICD-10-CM | POA: Diagnosis not present

## 2017-03-01 DIAGNOSIS — D631 Anemia in chronic kidney disease: Secondary | ICD-10-CM | POA: Diagnosis not present

## 2017-03-01 DIAGNOSIS — Z7982 Long term (current) use of aspirin: Secondary | ICD-10-CM | POA: Diagnosis not present

## 2017-03-02 DIAGNOSIS — I1311 Hypertensive heart and chronic kidney disease without heart failure, with stage 5 chronic kidney disease, or end stage renal disease: Secondary | ICD-10-CM | POA: Diagnosis not present

## 2017-03-02 DIAGNOSIS — I471 Supraventricular tachycardia: Secondary | ICD-10-CM | POA: Diagnosis not present

## 2017-03-02 DIAGNOSIS — Z7982 Long term (current) use of aspirin: Secondary | ICD-10-CM | POA: Diagnosis not present

## 2017-03-02 DIAGNOSIS — D631 Anemia in chronic kidney disease: Secondary | ICD-10-CM | POA: Diagnosis not present

## 2017-03-02 DIAGNOSIS — N186 End stage renal disease: Secondary | ICD-10-CM | POA: Diagnosis not present

## 2017-03-02 DIAGNOSIS — I2510 Atherosclerotic heart disease of native coronary artery without angina pectoris: Secondary | ICD-10-CM | POA: Diagnosis not present

## 2017-03-03 DIAGNOSIS — I1311 Hypertensive heart and chronic kidney disease without heart failure, with stage 5 chronic kidney disease, or end stage renal disease: Secondary | ICD-10-CM | POA: Diagnosis not present

## 2017-03-03 DIAGNOSIS — N186 End stage renal disease: Secondary | ICD-10-CM | POA: Diagnosis not present

## 2017-03-03 DIAGNOSIS — I471 Supraventricular tachycardia: Secondary | ICD-10-CM | POA: Diagnosis not present

## 2017-03-03 DIAGNOSIS — Z7982 Long term (current) use of aspirin: Secondary | ICD-10-CM | POA: Diagnosis not present

## 2017-03-03 DIAGNOSIS — D631 Anemia in chronic kidney disease: Secondary | ICD-10-CM | POA: Diagnosis not present

## 2017-03-03 DIAGNOSIS — I2510 Atherosclerotic heart disease of native coronary artery without angina pectoris: Secondary | ICD-10-CM | POA: Diagnosis not present

## 2017-03-04 DIAGNOSIS — Z7982 Long term (current) use of aspirin: Secondary | ICD-10-CM | POA: Diagnosis not present

## 2017-03-04 DIAGNOSIS — N186 End stage renal disease: Secondary | ICD-10-CM | POA: Diagnosis not present

## 2017-03-04 DIAGNOSIS — I471 Supraventricular tachycardia: Secondary | ICD-10-CM | POA: Diagnosis not present

## 2017-03-04 DIAGNOSIS — D631 Anemia in chronic kidney disease: Secondary | ICD-10-CM | POA: Diagnosis not present

## 2017-03-04 DIAGNOSIS — I2510 Atherosclerotic heart disease of native coronary artery without angina pectoris: Secondary | ICD-10-CM | POA: Diagnosis not present

## 2017-03-04 DIAGNOSIS — I1311 Hypertensive heart and chronic kidney disease without heart failure, with stage 5 chronic kidney disease, or end stage renal disease: Secondary | ICD-10-CM | POA: Diagnosis not present

## 2017-03-05 DIAGNOSIS — Z7982 Long term (current) use of aspirin: Secondary | ICD-10-CM | POA: Diagnosis not present

## 2017-03-05 DIAGNOSIS — N186 End stage renal disease: Secondary | ICD-10-CM | POA: Diagnosis not present

## 2017-03-05 DIAGNOSIS — I471 Supraventricular tachycardia: Secondary | ICD-10-CM | POA: Diagnosis not present

## 2017-03-05 DIAGNOSIS — I1311 Hypertensive heart and chronic kidney disease without heart failure, with stage 5 chronic kidney disease, or end stage renal disease: Secondary | ICD-10-CM | POA: Diagnosis not present

## 2017-03-05 DIAGNOSIS — D631 Anemia in chronic kidney disease: Secondary | ICD-10-CM | POA: Diagnosis not present

## 2017-03-05 DIAGNOSIS — I2510 Atherosclerotic heart disease of native coronary artery without angina pectoris: Secondary | ICD-10-CM | POA: Diagnosis not present

## 2017-03-08 DIAGNOSIS — Z7982 Long term (current) use of aspirin: Secondary | ICD-10-CM | POA: Diagnosis not present

## 2017-03-08 DIAGNOSIS — I2510 Atherosclerotic heart disease of native coronary artery without angina pectoris: Secondary | ICD-10-CM | POA: Diagnosis not present

## 2017-03-08 DIAGNOSIS — D631 Anemia in chronic kidney disease: Secondary | ICD-10-CM | POA: Diagnosis not present

## 2017-03-08 DIAGNOSIS — N186 End stage renal disease: Secondary | ICD-10-CM | POA: Diagnosis not present

## 2017-03-08 DIAGNOSIS — I1311 Hypertensive heart and chronic kidney disease without heart failure, with stage 5 chronic kidney disease, or end stage renal disease: Secondary | ICD-10-CM | POA: Diagnosis not present

## 2017-03-08 DIAGNOSIS — I471 Supraventricular tachycardia: Secondary | ICD-10-CM | POA: Diagnosis not present

## 2017-03-09 DIAGNOSIS — I2510 Atherosclerotic heart disease of native coronary artery without angina pectoris: Secondary | ICD-10-CM | POA: Diagnosis not present

## 2017-03-09 DIAGNOSIS — I1311 Hypertensive heart and chronic kidney disease without heart failure, with stage 5 chronic kidney disease, or end stage renal disease: Secondary | ICD-10-CM | POA: Diagnosis not present

## 2017-03-09 DIAGNOSIS — N186 End stage renal disease: Secondary | ICD-10-CM | POA: Diagnosis not present

## 2017-03-09 DIAGNOSIS — I471 Supraventricular tachycardia: Secondary | ICD-10-CM | POA: Diagnosis not present

## 2017-03-09 DIAGNOSIS — Z7982 Long term (current) use of aspirin: Secondary | ICD-10-CM | POA: Diagnosis not present

## 2017-03-09 DIAGNOSIS — D631 Anemia in chronic kidney disease: Secondary | ICD-10-CM | POA: Diagnosis not present

## 2017-03-10 DIAGNOSIS — I2510 Atherosclerotic heart disease of native coronary artery without angina pectoris: Secondary | ICD-10-CM | POA: Diagnosis not present

## 2017-03-10 DIAGNOSIS — Z7982 Long term (current) use of aspirin: Secondary | ICD-10-CM | POA: Diagnosis not present

## 2017-03-10 DIAGNOSIS — D631 Anemia in chronic kidney disease: Secondary | ICD-10-CM | POA: Diagnosis not present

## 2017-03-10 DIAGNOSIS — I471 Supraventricular tachycardia: Secondary | ICD-10-CM | POA: Diagnosis not present

## 2017-03-10 DIAGNOSIS — I1311 Hypertensive heart and chronic kidney disease without heart failure, with stage 5 chronic kidney disease, or end stage renal disease: Secondary | ICD-10-CM | POA: Diagnosis not present

## 2017-03-10 DIAGNOSIS — N186 End stage renal disease: Secondary | ICD-10-CM | POA: Diagnosis not present

## 2017-03-11 DIAGNOSIS — N186 End stage renal disease: Secondary | ICD-10-CM | POA: Diagnosis not present

## 2017-03-11 DIAGNOSIS — I2510 Atherosclerotic heart disease of native coronary artery without angina pectoris: Secondary | ICD-10-CM | POA: Diagnosis not present

## 2017-03-11 DIAGNOSIS — I471 Supraventricular tachycardia: Secondary | ICD-10-CM | POA: Diagnosis not present

## 2017-03-11 DIAGNOSIS — D631 Anemia in chronic kidney disease: Secondary | ICD-10-CM | POA: Diagnosis not present

## 2017-03-11 DIAGNOSIS — Z7982 Long term (current) use of aspirin: Secondary | ICD-10-CM | POA: Diagnosis not present

## 2017-03-11 DIAGNOSIS — I1311 Hypertensive heart and chronic kidney disease without heart failure, with stage 5 chronic kidney disease, or end stage renal disease: Secondary | ICD-10-CM | POA: Diagnosis not present

## 2017-03-12 DIAGNOSIS — I1311 Hypertensive heart and chronic kidney disease without heart failure, with stage 5 chronic kidney disease, or end stage renal disease: Secondary | ICD-10-CM | POA: Diagnosis not present

## 2017-03-12 DIAGNOSIS — Z7982 Long term (current) use of aspirin: Secondary | ICD-10-CM | POA: Diagnosis not present

## 2017-03-12 DIAGNOSIS — D631 Anemia in chronic kidney disease: Secondary | ICD-10-CM | POA: Diagnosis not present

## 2017-03-12 DIAGNOSIS — I471 Supraventricular tachycardia: Secondary | ICD-10-CM | POA: Diagnosis not present

## 2017-03-12 DIAGNOSIS — N186 End stage renal disease: Secondary | ICD-10-CM | POA: Diagnosis not present

## 2017-03-12 DIAGNOSIS — I2510 Atherosclerotic heart disease of native coronary artery without angina pectoris: Secondary | ICD-10-CM | POA: Diagnosis not present

## 2017-03-15 DIAGNOSIS — I2510 Atherosclerotic heart disease of native coronary artery without angina pectoris: Secondary | ICD-10-CM | POA: Diagnosis not present

## 2017-03-15 DIAGNOSIS — Z7982 Long term (current) use of aspirin: Secondary | ICD-10-CM | POA: Diagnosis not present

## 2017-03-15 DIAGNOSIS — D631 Anemia in chronic kidney disease: Secondary | ICD-10-CM | POA: Diagnosis not present

## 2017-03-15 DIAGNOSIS — N186 End stage renal disease: Secondary | ICD-10-CM | POA: Diagnosis not present

## 2017-03-15 DIAGNOSIS — I1311 Hypertensive heart and chronic kidney disease without heart failure, with stage 5 chronic kidney disease, or end stage renal disease: Secondary | ICD-10-CM | POA: Diagnosis not present

## 2017-03-15 DIAGNOSIS — I471 Supraventricular tachycardia: Secondary | ICD-10-CM | POA: Diagnosis not present

## 2017-03-16 DIAGNOSIS — Z7982 Long term (current) use of aspirin: Secondary | ICD-10-CM | POA: Diagnosis not present

## 2017-03-16 DIAGNOSIS — I2510 Atherosclerotic heart disease of native coronary artery without angina pectoris: Secondary | ICD-10-CM | POA: Diagnosis not present

## 2017-03-16 DIAGNOSIS — I1311 Hypertensive heart and chronic kidney disease without heart failure, with stage 5 chronic kidney disease, or end stage renal disease: Secondary | ICD-10-CM | POA: Diagnosis not present

## 2017-03-16 DIAGNOSIS — N186 End stage renal disease: Secondary | ICD-10-CM | POA: Diagnosis not present

## 2017-03-16 DIAGNOSIS — I471 Supraventricular tachycardia: Secondary | ICD-10-CM | POA: Diagnosis not present

## 2017-03-16 DIAGNOSIS — D631 Anemia in chronic kidney disease: Secondary | ICD-10-CM | POA: Diagnosis not present

## 2017-03-17 DIAGNOSIS — I2510 Atherosclerotic heart disease of native coronary artery without angina pectoris: Secondary | ICD-10-CM | POA: Diagnosis not present

## 2017-03-17 DIAGNOSIS — N186 End stage renal disease: Secondary | ICD-10-CM | POA: Diagnosis not present

## 2017-03-17 DIAGNOSIS — Z7982 Long term (current) use of aspirin: Secondary | ICD-10-CM | POA: Diagnosis not present

## 2017-03-17 DIAGNOSIS — D631 Anemia in chronic kidney disease: Secondary | ICD-10-CM | POA: Diagnosis not present

## 2017-03-17 DIAGNOSIS — I1311 Hypertensive heart and chronic kidney disease without heart failure, with stage 5 chronic kidney disease, or end stage renal disease: Secondary | ICD-10-CM | POA: Diagnosis not present

## 2017-03-17 DIAGNOSIS — I471 Supraventricular tachycardia: Secondary | ICD-10-CM | POA: Diagnosis not present

## 2017-03-18 DIAGNOSIS — I471 Supraventricular tachycardia: Secondary | ICD-10-CM | POA: Diagnosis not present

## 2017-03-18 DIAGNOSIS — I1311 Hypertensive heart and chronic kidney disease without heart failure, with stage 5 chronic kidney disease, or end stage renal disease: Secondary | ICD-10-CM | POA: Diagnosis not present

## 2017-03-18 DIAGNOSIS — D631 Anemia in chronic kidney disease: Secondary | ICD-10-CM | POA: Diagnosis not present

## 2017-03-18 DIAGNOSIS — Z7982 Long term (current) use of aspirin: Secondary | ICD-10-CM | POA: Diagnosis not present

## 2017-03-18 DIAGNOSIS — I2510 Atherosclerotic heart disease of native coronary artery without angina pectoris: Secondary | ICD-10-CM | POA: Diagnosis not present

## 2017-03-18 DIAGNOSIS — N186 End stage renal disease: Secondary | ICD-10-CM | POA: Diagnosis not present

## 2017-03-19 DIAGNOSIS — I2510 Atherosclerotic heart disease of native coronary artery without angina pectoris: Secondary | ICD-10-CM | POA: Diagnosis not present

## 2017-03-19 DIAGNOSIS — D631 Anemia in chronic kidney disease: Secondary | ICD-10-CM | POA: Diagnosis not present

## 2017-03-19 DIAGNOSIS — Z7982 Long term (current) use of aspirin: Secondary | ICD-10-CM | POA: Diagnosis not present

## 2017-03-19 DIAGNOSIS — I1311 Hypertensive heart and chronic kidney disease without heart failure, with stage 5 chronic kidney disease, or end stage renal disease: Secondary | ICD-10-CM | POA: Diagnosis not present

## 2017-03-19 DIAGNOSIS — I471 Supraventricular tachycardia: Secondary | ICD-10-CM | POA: Diagnosis not present

## 2017-03-19 DIAGNOSIS — N186 End stage renal disease: Secondary | ICD-10-CM | POA: Diagnosis not present

## 2017-03-22 DIAGNOSIS — Z6820 Body mass index (BMI) 20.0-20.9, adult: Secondary | ICD-10-CM | POA: Diagnosis not present

## 2017-03-22 DIAGNOSIS — Z9181 History of falling: Secondary | ICD-10-CM | POA: Diagnosis not present

## 2017-03-22 DIAGNOSIS — B372 Candidiasis of skin and nail: Secondary | ICD-10-CM | POA: Diagnosis not present

## 2017-03-22 DIAGNOSIS — Z7982 Long term (current) use of aspirin: Secondary | ICD-10-CM | POA: Diagnosis not present

## 2017-03-22 DIAGNOSIS — M109 Gout, unspecified: Secondary | ICD-10-CM | POA: Diagnosis not present

## 2017-03-22 DIAGNOSIS — E039 Hypothyroidism, unspecified: Secondary | ICD-10-CM | POA: Diagnosis not present

## 2017-03-22 DIAGNOSIS — I471 Supraventricular tachycardia: Secondary | ICD-10-CM | POA: Diagnosis not present

## 2017-03-22 DIAGNOSIS — D631 Anemia in chronic kidney disease: Secondary | ICD-10-CM | POA: Diagnosis not present

## 2017-03-22 DIAGNOSIS — F329 Major depressive disorder, single episode, unspecified: Secondary | ICD-10-CM | POA: Diagnosis not present

## 2017-03-22 DIAGNOSIS — I1311 Hypertensive heart and chronic kidney disease without heart failure, with stage 5 chronic kidney disease, or end stage renal disease: Secondary | ICD-10-CM | POA: Diagnosis not present

## 2017-03-22 DIAGNOSIS — I340 Nonrheumatic mitral (valve) insufficiency: Secondary | ICD-10-CM | POA: Diagnosis not present

## 2017-03-22 DIAGNOSIS — E559 Vitamin D deficiency, unspecified: Secondary | ICD-10-CM | POA: Diagnosis not present

## 2017-03-22 DIAGNOSIS — Z9981 Dependence on supplemental oxygen: Secondary | ICD-10-CM | POA: Diagnosis not present

## 2017-03-22 DIAGNOSIS — E785 Hyperlipidemia, unspecified: Secondary | ICD-10-CM | POA: Diagnosis not present

## 2017-03-22 DIAGNOSIS — I361 Nonrheumatic tricuspid (valve) insufficiency: Secondary | ICD-10-CM | POA: Diagnosis not present

## 2017-03-22 DIAGNOSIS — I2510 Atherosclerotic heart disease of native coronary artery without angina pectoris: Secondary | ICD-10-CM | POA: Diagnosis not present

## 2017-03-22 DIAGNOSIS — N186 End stage renal disease: Secondary | ICD-10-CM | POA: Diagnosis not present

## 2017-03-22 DIAGNOSIS — I480 Paroxysmal atrial fibrillation: Secondary | ICD-10-CM | POA: Diagnosis not present

## 2017-03-23 DIAGNOSIS — N186 End stage renal disease: Secondary | ICD-10-CM | POA: Diagnosis not present

## 2017-03-23 DIAGNOSIS — I1311 Hypertensive heart and chronic kidney disease without heart failure, with stage 5 chronic kidney disease, or end stage renal disease: Secondary | ICD-10-CM | POA: Diagnosis not present

## 2017-03-23 DIAGNOSIS — I480 Paroxysmal atrial fibrillation: Secondary | ICD-10-CM | POA: Diagnosis not present

## 2017-03-23 DIAGNOSIS — I2510 Atherosclerotic heart disease of native coronary artery without angina pectoris: Secondary | ICD-10-CM | POA: Diagnosis not present

## 2017-03-23 DIAGNOSIS — I471 Supraventricular tachycardia: Secondary | ICD-10-CM | POA: Diagnosis not present

## 2017-03-23 DIAGNOSIS — D631 Anemia in chronic kidney disease: Secondary | ICD-10-CM | POA: Diagnosis not present

## 2017-03-24 DIAGNOSIS — N186 End stage renal disease: Secondary | ICD-10-CM | POA: Diagnosis not present

## 2017-03-24 DIAGNOSIS — I471 Supraventricular tachycardia: Secondary | ICD-10-CM | POA: Diagnosis not present

## 2017-03-24 DIAGNOSIS — I1311 Hypertensive heart and chronic kidney disease without heart failure, with stage 5 chronic kidney disease, or end stage renal disease: Secondary | ICD-10-CM | POA: Diagnosis not present

## 2017-03-24 DIAGNOSIS — I480 Paroxysmal atrial fibrillation: Secondary | ICD-10-CM | POA: Diagnosis not present

## 2017-03-24 DIAGNOSIS — I2510 Atherosclerotic heart disease of native coronary artery without angina pectoris: Secondary | ICD-10-CM | POA: Diagnosis not present

## 2017-03-24 DIAGNOSIS — D631 Anemia in chronic kidney disease: Secondary | ICD-10-CM | POA: Diagnosis not present

## 2017-03-25 DIAGNOSIS — I480 Paroxysmal atrial fibrillation: Secondary | ICD-10-CM | POA: Diagnosis not present

## 2017-03-25 DIAGNOSIS — I2510 Atherosclerotic heart disease of native coronary artery without angina pectoris: Secondary | ICD-10-CM | POA: Diagnosis not present

## 2017-03-25 DIAGNOSIS — I471 Supraventricular tachycardia: Secondary | ICD-10-CM | POA: Diagnosis not present

## 2017-03-25 DIAGNOSIS — D638 Anemia in other chronic diseases classified elsewhere: Secondary | ICD-10-CM | POA: Diagnosis not present

## 2017-03-25 DIAGNOSIS — N186 End stage renal disease: Secondary | ICD-10-CM | POA: Diagnosis not present

## 2017-03-25 DIAGNOSIS — E119 Type 2 diabetes mellitus without complications: Secondary | ICD-10-CM | POA: Diagnosis not present

## 2017-03-25 DIAGNOSIS — I1311 Hypertensive heart and chronic kidney disease without heart failure, with stage 5 chronic kidney disease, or end stage renal disease: Secondary | ICD-10-CM | POA: Diagnosis not present

## 2017-03-25 DIAGNOSIS — D631 Anemia in chronic kidney disease: Secondary | ICD-10-CM | POA: Diagnosis not present

## 2017-03-25 DIAGNOSIS — I10 Essential (primary) hypertension: Secondary | ICD-10-CM | POA: Diagnosis not present

## 2017-03-26 DIAGNOSIS — I2510 Atherosclerotic heart disease of native coronary artery without angina pectoris: Secondary | ICD-10-CM | POA: Diagnosis not present

## 2017-03-26 DIAGNOSIS — I1311 Hypertensive heart and chronic kidney disease without heart failure, with stage 5 chronic kidney disease, or end stage renal disease: Secondary | ICD-10-CM | POA: Diagnosis not present

## 2017-03-26 DIAGNOSIS — I471 Supraventricular tachycardia: Secondary | ICD-10-CM | POA: Diagnosis not present

## 2017-03-26 DIAGNOSIS — I480 Paroxysmal atrial fibrillation: Secondary | ICD-10-CM | POA: Diagnosis not present

## 2017-03-26 DIAGNOSIS — D631 Anemia in chronic kidney disease: Secondary | ICD-10-CM | POA: Diagnosis not present

## 2017-03-26 DIAGNOSIS — N186 End stage renal disease: Secondary | ICD-10-CM | POA: Diagnosis not present

## 2017-03-29 DIAGNOSIS — I480 Paroxysmal atrial fibrillation: Secondary | ICD-10-CM | POA: Diagnosis not present

## 2017-03-29 DIAGNOSIS — D631 Anemia in chronic kidney disease: Secondary | ICD-10-CM | POA: Diagnosis not present

## 2017-03-29 DIAGNOSIS — I1311 Hypertensive heart and chronic kidney disease without heart failure, with stage 5 chronic kidney disease, or end stage renal disease: Secondary | ICD-10-CM | POA: Diagnosis not present

## 2017-03-29 DIAGNOSIS — I2510 Atherosclerotic heart disease of native coronary artery without angina pectoris: Secondary | ICD-10-CM | POA: Diagnosis not present

## 2017-03-29 DIAGNOSIS — I471 Supraventricular tachycardia: Secondary | ICD-10-CM | POA: Diagnosis not present

## 2017-03-29 DIAGNOSIS — N186 End stage renal disease: Secondary | ICD-10-CM | POA: Diagnosis not present

## 2017-03-30 DIAGNOSIS — I2510 Atherosclerotic heart disease of native coronary artery without angina pectoris: Secondary | ICD-10-CM | POA: Diagnosis not present

## 2017-03-30 DIAGNOSIS — I471 Supraventricular tachycardia: Secondary | ICD-10-CM | POA: Diagnosis not present

## 2017-03-30 DIAGNOSIS — N186 End stage renal disease: Secondary | ICD-10-CM | POA: Diagnosis not present

## 2017-03-30 DIAGNOSIS — D631 Anemia in chronic kidney disease: Secondary | ICD-10-CM | POA: Diagnosis not present

## 2017-03-30 DIAGNOSIS — I480 Paroxysmal atrial fibrillation: Secondary | ICD-10-CM | POA: Diagnosis not present

## 2017-03-30 DIAGNOSIS — I1311 Hypertensive heart and chronic kidney disease without heart failure, with stage 5 chronic kidney disease, or end stage renal disease: Secondary | ICD-10-CM | POA: Diagnosis not present

## 2017-03-31 DIAGNOSIS — I480 Paroxysmal atrial fibrillation: Secondary | ICD-10-CM | POA: Diagnosis not present

## 2017-03-31 DIAGNOSIS — I2510 Atherosclerotic heart disease of native coronary artery without angina pectoris: Secondary | ICD-10-CM | POA: Diagnosis not present

## 2017-03-31 DIAGNOSIS — N186 End stage renal disease: Secondary | ICD-10-CM | POA: Diagnosis not present

## 2017-03-31 DIAGNOSIS — I1311 Hypertensive heart and chronic kidney disease without heart failure, with stage 5 chronic kidney disease, or end stage renal disease: Secondary | ICD-10-CM | POA: Diagnosis not present

## 2017-03-31 DIAGNOSIS — D631 Anemia in chronic kidney disease: Secondary | ICD-10-CM | POA: Diagnosis not present

## 2017-03-31 DIAGNOSIS — I471 Supraventricular tachycardia: Secondary | ICD-10-CM | POA: Diagnosis not present

## 2017-04-01 DIAGNOSIS — I480 Paroxysmal atrial fibrillation: Secondary | ICD-10-CM | POA: Diagnosis not present

## 2017-04-01 DIAGNOSIS — D631 Anemia in chronic kidney disease: Secondary | ICD-10-CM | POA: Diagnosis not present

## 2017-04-01 DIAGNOSIS — I1311 Hypertensive heart and chronic kidney disease without heart failure, with stage 5 chronic kidney disease, or end stage renal disease: Secondary | ICD-10-CM | POA: Diagnosis not present

## 2017-04-01 DIAGNOSIS — I471 Supraventricular tachycardia: Secondary | ICD-10-CM | POA: Diagnosis not present

## 2017-04-01 DIAGNOSIS — N186 End stage renal disease: Secondary | ICD-10-CM | POA: Diagnosis not present

## 2017-04-01 DIAGNOSIS — I2510 Atherosclerotic heart disease of native coronary artery without angina pectoris: Secondary | ICD-10-CM | POA: Diagnosis not present

## 2017-04-02 DIAGNOSIS — D631 Anemia in chronic kidney disease: Secondary | ICD-10-CM | POA: Diagnosis not present

## 2017-04-02 DIAGNOSIS — I480 Paroxysmal atrial fibrillation: Secondary | ICD-10-CM | POA: Diagnosis not present

## 2017-04-02 DIAGNOSIS — I2510 Atherosclerotic heart disease of native coronary artery without angina pectoris: Secondary | ICD-10-CM | POA: Diagnosis not present

## 2017-04-02 DIAGNOSIS — I471 Supraventricular tachycardia: Secondary | ICD-10-CM | POA: Diagnosis not present

## 2017-04-02 DIAGNOSIS — I1311 Hypertensive heart and chronic kidney disease without heart failure, with stage 5 chronic kidney disease, or end stage renal disease: Secondary | ICD-10-CM | POA: Diagnosis not present

## 2017-04-02 DIAGNOSIS — N186 End stage renal disease: Secondary | ICD-10-CM | POA: Diagnosis not present

## 2017-04-05 DIAGNOSIS — N186 End stage renal disease: Secondary | ICD-10-CM | POA: Diagnosis not present

## 2017-04-05 DIAGNOSIS — I2510 Atherosclerotic heart disease of native coronary artery without angina pectoris: Secondary | ICD-10-CM | POA: Diagnosis not present

## 2017-04-05 DIAGNOSIS — I1311 Hypertensive heart and chronic kidney disease without heart failure, with stage 5 chronic kidney disease, or end stage renal disease: Secondary | ICD-10-CM | POA: Diagnosis not present

## 2017-04-05 DIAGNOSIS — I471 Supraventricular tachycardia: Secondary | ICD-10-CM | POA: Diagnosis not present

## 2017-04-05 DIAGNOSIS — D631 Anemia in chronic kidney disease: Secondary | ICD-10-CM | POA: Diagnosis not present

## 2017-04-05 DIAGNOSIS — I480 Paroxysmal atrial fibrillation: Secondary | ICD-10-CM | POA: Diagnosis not present

## 2017-04-06 DIAGNOSIS — N186 End stage renal disease: Secondary | ICD-10-CM | POA: Diagnosis not present

## 2017-04-06 DIAGNOSIS — I471 Supraventricular tachycardia: Secondary | ICD-10-CM | POA: Diagnosis not present

## 2017-04-06 DIAGNOSIS — I480 Paroxysmal atrial fibrillation: Secondary | ICD-10-CM | POA: Diagnosis not present

## 2017-04-06 DIAGNOSIS — I2510 Atherosclerotic heart disease of native coronary artery without angina pectoris: Secondary | ICD-10-CM | POA: Diagnosis not present

## 2017-04-06 DIAGNOSIS — I1311 Hypertensive heart and chronic kidney disease without heart failure, with stage 5 chronic kidney disease, or end stage renal disease: Secondary | ICD-10-CM | POA: Diagnosis not present

## 2017-04-06 DIAGNOSIS — D631 Anemia in chronic kidney disease: Secondary | ICD-10-CM | POA: Diagnosis not present

## 2017-04-07 DIAGNOSIS — I2510 Atherosclerotic heart disease of native coronary artery without angina pectoris: Secondary | ICD-10-CM | POA: Diagnosis not present

## 2017-04-07 DIAGNOSIS — D631 Anemia in chronic kidney disease: Secondary | ICD-10-CM | POA: Diagnosis not present

## 2017-04-07 DIAGNOSIS — I1311 Hypertensive heart and chronic kidney disease without heart failure, with stage 5 chronic kidney disease, or end stage renal disease: Secondary | ICD-10-CM | POA: Diagnosis not present

## 2017-04-07 DIAGNOSIS — N186 End stage renal disease: Secondary | ICD-10-CM | POA: Diagnosis not present

## 2017-04-07 DIAGNOSIS — I471 Supraventricular tachycardia: Secondary | ICD-10-CM | POA: Diagnosis not present

## 2017-04-07 DIAGNOSIS — I480 Paroxysmal atrial fibrillation: Secondary | ICD-10-CM | POA: Diagnosis not present

## 2017-04-08 DIAGNOSIS — N186 End stage renal disease: Secondary | ICD-10-CM | POA: Diagnosis not present

## 2017-04-08 DIAGNOSIS — I480 Paroxysmal atrial fibrillation: Secondary | ICD-10-CM | POA: Diagnosis not present

## 2017-04-08 DIAGNOSIS — D631 Anemia in chronic kidney disease: Secondary | ICD-10-CM | POA: Diagnosis not present

## 2017-04-08 DIAGNOSIS — I2510 Atherosclerotic heart disease of native coronary artery without angina pectoris: Secondary | ICD-10-CM | POA: Diagnosis not present

## 2017-04-08 DIAGNOSIS — I1311 Hypertensive heart and chronic kidney disease without heart failure, with stage 5 chronic kidney disease, or end stage renal disease: Secondary | ICD-10-CM | POA: Diagnosis not present

## 2017-04-08 DIAGNOSIS — I471 Supraventricular tachycardia: Secondary | ICD-10-CM | POA: Diagnosis not present

## 2017-04-09 DIAGNOSIS — I1311 Hypertensive heart and chronic kidney disease without heart failure, with stage 5 chronic kidney disease, or end stage renal disease: Secondary | ICD-10-CM | POA: Diagnosis not present

## 2017-04-09 DIAGNOSIS — I480 Paroxysmal atrial fibrillation: Secondary | ICD-10-CM | POA: Diagnosis not present

## 2017-04-09 DIAGNOSIS — I2510 Atherosclerotic heart disease of native coronary artery without angina pectoris: Secondary | ICD-10-CM | POA: Diagnosis not present

## 2017-04-09 DIAGNOSIS — D631 Anemia in chronic kidney disease: Secondary | ICD-10-CM | POA: Diagnosis not present

## 2017-04-09 DIAGNOSIS — I471 Supraventricular tachycardia: Secondary | ICD-10-CM | POA: Diagnosis not present

## 2017-04-09 DIAGNOSIS — N186 End stage renal disease: Secondary | ICD-10-CM | POA: Diagnosis not present

## 2017-04-12 DIAGNOSIS — I480 Paroxysmal atrial fibrillation: Secondary | ICD-10-CM | POA: Diagnosis not present

## 2017-04-12 DIAGNOSIS — D631 Anemia in chronic kidney disease: Secondary | ICD-10-CM | POA: Diagnosis not present

## 2017-04-12 DIAGNOSIS — I471 Supraventricular tachycardia: Secondary | ICD-10-CM | POA: Diagnosis not present

## 2017-04-12 DIAGNOSIS — N186 End stage renal disease: Secondary | ICD-10-CM | POA: Diagnosis not present

## 2017-04-12 DIAGNOSIS — I2510 Atherosclerotic heart disease of native coronary artery without angina pectoris: Secondary | ICD-10-CM | POA: Diagnosis not present

## 2017-04-12 DIAGNOSIS — I1311 Hypertensive heart and chronic kidney disease without heart failure, with stage 5 chronic kidney disease, or end stage renal disease: Secondary | ICD-10-CM | POA: Diagnosis not present

## 2017-04-13 DIAGNOSIS — I471 Supraventricular tachycardia: Secondary | ICD-10-CM | POA: Diagnosis not present

## 2017-04-13 DIAGNOSIS — D631 Anemia in chronic kidney disease: Secondary | ICD-10-CM | POA: Diagnosis not present

## 2017-04-13 DIAGNOSIS — I1311 Hypertensive heart and chronic kidney disease without heart failure, with stage 5 chronic kidney disease, or end stage renal disease: Secondary | ICD-10-CM | POA: Diagnosis not present

## 2017-04-13 DIAGNOSIS — I480 Paroxysmal atrial fibrillation: Secondary | ICD-10-CM | POA: Diagnosis not present

## 2017-04-13 DIAGNOSIS — N186 End stage renal disease: Secondary | ICD-10-CM | POA: Diagnosis not present

## 2017-04-13 DIAGNOSIS — I2510 Atherosclerotic heart disease of native coronary artery without angina pectoris: Secondary | ICD-10-CM | POA: Diagnosis not present

## 2017-04-14 DIAGNOSIS — I1311 Hypertensive heart and chronic kidney disease without heart failure, with stage 5 chronic kidney disease, or end stage renal disease: Secondary | ICD-10-CM | POA: Diagnosis not present

## 2017-04-14 DIAGNOSIS — N186 End stage renal disease: Secondary | ICD-10-CM | POA: Diagnosis not present

## 2017-04-14 DIAGNOSIS — I471 Supraventricular tachycardia: Secondary | ICD-10-CM | POA: Diagnosis not present

## 2017-04-14 DIAGNOSIS — D631 Anemia in chronic kidney disease: Secondary | ICD-10-CM | POA: Diagnosis not present

## 2017-04-14 DIAGNOSIS — I480 Paroxysmal atrial fibrillation: Secondary | ICD-10-CM | POA: Diagnosis not present

## 2017-04-14 DIAGNOSIS — I2510 Atherosclerotic heart disease of native coronary artery without angina pectoris: Secondary | ICD-10-CM | POA: Diagnosis not present

## 2017-04-15 DIAGNOSIS — D631 Anemia in chronic kidney disease: Secondary | ICD-10-CM | POA: Diagnosis not present

## 2017-04-15 DIAGNOSIS — N186 End stage renal disease: Secondary | ICD-10-CM | POA: Diagnosis not present

## 2017-04-15 DIAGNOSIS — I2510 Atherosclerotic heart disease of native coronary artery without angina pectoris: Secondary | ICD-10-CM | POA: Diagnosis not present

## 2017-04-15 DIAGNOSIS — I471 Supraventricular tachycardia: Secondary | ICD-10-CM | POA: Diagnosis not present

## 2017-04-15 DIAGNOSIS — I480 Paroxysmal atrial fibrillation: Secondary | ICD-10-CM | POA: Diagnosis not present

## 2017-04-15 DIAGNOSIS — I1311 Hypertensive heart and chronic kidney disease without heart failure, with stage 5 chronic kidney disease, or end stage renal disease: Secondary | ICD-10-CM | POA: Diagnosis not present

## 2017-04-16 DIAGNOSIS — I2510 Atherosclerotic heart disease of native coronary artery without angina pectoris: Secondary | ICD-10-CM | POA: Diagnosis not present

## 2017-04-16 DIAGNOSIS — N186 End stage renal disease: Secondary | ICD-10-CM | POA: Diagnosis not present

## 2017-04-16 DIAGNOSIS — I1311 Hypertensive heart and chronic kidney disease without heart failure, with stage 5 chronic kidney disease, or end stage renal disease: Secondary | ICD-10-CM | POA: Diagnosis not present

## 2017-04-16 DIAGNOSIS — I480 Paroxysmal atrial fibrillation: Secondary | ICD-10-CM | POA: Diagnosis not present

## 2017-04-16 DIAGNOSIS — D631 Anemia in chronic kidney disease: Secondary | ICD-10-CM | POA: Diagnosis not present

## 2017-04-16 DIAGNOSIS — I471 Supraventricular tachycardia: Secondary | ICD-10-CM | POA: Diagnosis not present

## 2017-04-19 DIAGNOSIS — I2510 Atherosclerotic heart disease of native coronary artery without angina pectoris: Secondary | ICD-10-CM | POA: Diagnosis not present

## 2017-04-19 DIAGNOSIS — I480 Paroxysmal atrial fibrillation: Secondary | ICD-10-CM | POA: Diagnosis not present

## 2017-04-19 DIAGNOSIS — I471 Supraventricular tachycardia: Secondary | ICD-10-CM | POA: Diagnosis not present

## 2017-04-19 DIAGNOSIS — N186 End stage renal disease: Secondary | ICD-10-CM | POA: Diagnosis not present

## 2017-04-19 DIAGNOSIS — I1311 Hypertensive heart and chronic kidney disease without heart failure, with stage 5 chronic kidney disease, or end stage renal disease: Secondary | ICD-10-CM | POA: Diagnosis not present

## 2017-04-19 DIAGNOSIS — D631 Anemia in chronic kidney disease: Secondary | ICD-10-CM | POA: Diagnosis not present

## 2017-04-20 DIAGNOSIS — I480 Paroxysmal atrial fibrillation: Secondary | ICD-10-CM | POA: Diagnosis not present

## 2017-04-20 DIAGNOSIS — D631 Anemia in chronic kidney disease: Secondary | ICD-10-CM | POA: Diagnosis not present

## 2017-04-20 DIAGNOSIS — I1311 Hypertensive heart and chronic kidney disease without heart failure, with stage 5 chronic kidney disease, or end stage renal disease: Secondary | ICD-10-CM | POA: Diagnosis not present

## 2017-04-20 DIAGNOSIS — I2510 Atherosclerotic heart disease of native coronary artery without angina pectoris: Secondary | ICD-10-CM | POA: Diagnosis not present

## 2017-04-20 DIAGNOSIS — N186 End stage renal disease: Secondary | ICD-10-CM | POA: Diagnosis not present

## 2017-04-20 DIAGNOSIS — I471 Supraventricular tachycardia: Secondary | ICD-10-CM | POA: Diagnosis not present

## 2017-04-21 DIAGNOSIS — N186 End stage renal disease: Secondary | ICD-10-CM | POA: Diagnosis not present

## 2017-04-21 DIAGNOSIS — I471 Supraventricular tachycardia: Secondary | ICD-10-CM | POA: Diagnosis not present

## 2017-04-21 DIAGNOSIS — D631 Anemia in chronic kidney disease: Secondary | ICD-10-CM | POA: Diagnosis not present

## 2017-04-21 DIAGNOSIS — I2510 Atherosclerotic heart disease of native coronary artery without angina pectoris: Secondary | ICD-10-CM | POA: Diagnosis not present

## 2017-04-21 DIAGNOSIS — I480 Paroxysmal atrial fibrillation: Secondary | ICD-10-CM | POA: Diagnosis not present

## 2017-04-21 DIAGNOSIS — I1311 Hypertensive heart and chronic kidney disease without heart failure, with stage 5 chronic kidney disease, or end stage renal disease: Secondary | ICD-10-CM | POA: Diagnosis not present

## 2017-04-22 DIAGNOSIS — E039 Hypothyroidism, unspecified: Secondary | ICD-10-CM | POA: Diagnosis not present

## 2017-04-22 DIAGNOSIS — I480 Paroxysmal atrial fibrillation: Secondary | ICD-10-CM | POA: Diagnosis not present

## 2017-04-22 DIAGNOSIS — N186 End stage renal disease: Secondary | ICD-10-CM | POA: Diagnosis not present

## 2017-04-22 DIAGNOSIS — I471 Supraventricular tachycardia: Secondary | ICD-10-CM | POA: Diagnosis not present

## 2017-04-22 DIAGNOSIS — I361 Nonrheumatic tricuspid (valve) insufficiency: Secondary | ICD-10-CM | POA: Diagnosis not present

## 2017-04-22 DIAGNOSIS — E785 Hyperlipidemia, unspecified: Secondary | ICD-10-CM | POA: Diagnosis not present

## 2017-04-22 DIAGNOSIS — Z9181 History of falling: Secondary | ICD-10-CM | POA: Diagnosis not present

## 2017-04-22 DIAGNOSIS — Z6820 Body mass index (BMI) 20.0-20.9, adult: Secondary | ICD-10-CM | POA: Diagnosis not present

## 2017-04-22 DIAGNOSIS — Z7982 Long term (current) use of aspirin: Secondary | ICD-10-CM | POA: Diagnosis not present

## 2017-04-22 DIAGNOSIS — B372 Candidiasis of skin and nail: Secondary | ICD-10-CM | POA: Diagnosis not present

## 2017-04-22 DIAGNOSIS — I1311 Hypertensive heart and chronic kidney disease without heart failure, with stage 5 chronic kidney disease, or end stage renal disease: Secondary | ICD-10-CM | POA: Diagnosis not present

## 2017-04-22 DIAGNOSIS — F329 Major depressive disorder, single episode, unspecified: Secondary | ICD-10-CM | POA: Diagnosis not present

## 2017-04-22 DIAGNOSIS — D631 Anemia in chronic kidney disease: Secondary | ICD-10-CM | POA: Diagnosis not present

## 2017-04-22 DIAGNOSIS — E559 Vitamin D deficiency, unspecified: Secondary | ICD-10-CM | POA: Diagnosis not present

## 2017-04-22 DIAGNOSIS — M109 Gout, unspecified: Secondary | ICD-10-CM | POA: Diagnosis not present

## 2017-04-22 DIAGNOSIS — I2510 Atherosclerotic heart disease of native coronary artery without angina pectoris: Secondary | ICD-10-CM | POA: Diagnosis not present

## 2017-04-22 DIAGNOSIS — Z9981 Dependence on supplemental oxygen: Secondary | ICD-10-CM | POA: Diagnosis not present

## 2017-04-22 DIAGNOSIS — I340 Nonrheumatic mitral (valve) insufficiency: Secondary | ICD-10-CM | POA: Diagnosis not present

## 2017-04-23 DIAGNOSIS — I2510 Atherosclerotic heart disease of native coronary artery without angina pectoris: Secondary | ICD-10-CM | POA: Diagnosis not present

## 2017-04-23 DIAGNOSIS — N186 End stage renal disease: Secondary | ICD-10-CM | POA: Diagnosis not present

## 2017-04-23 DIAGNOSIS — I1311 Hypertensive heart and chronic kidney disease without heart failure, with stage 5 chronic kidney disease, or end stage renal disease: Secondary | ICD-10-CM | POA: Diagnosis not present

## 2017-04-23 DIAGNOSIS — D631 Anemia in chronic kidney disease: Secondary | ICD-10-CM | POA: Diagnosis not present

## 2017-04-23 DIAGNOSIS — I480 Paroxysmal atrial fibrillation: Secondary | ICD-10-CM | POA: Diagnosis not present

## 2017-04-23 DIAGNOSIS — I471 Supraventricular tachycardia: Secondary | ICD-10-CM | POA: Diagnosis not present

## 2017-04-26 DIAGNOSIS — I471 Supraventricular tachycardia: Secondary | ICD-10-CM | POA: Diagnosis not present

## 2017-04-26 DIAGNOSIS — I2510 Atherosclerotic heart disease of native coronary artery without angina pectoris: Secondary | ICD-10-CM | POA: Diagnosis not present

## 2017-04-26 DIAGNOSIS — D631 Anemia in chronic kidney disease: Secondary | ICD-10-CM | POA: Diagnosis not present

## 2017-04-26 DIAGNOSIS — I1311 Hypertensive heart and chronic kidney disease without heart failure, with stage 5 chronic kidney disease, or end stage renal disease: Secondary | ICD-10-CM | POA: Diagnosis not present

## 2017-04-26 DIAGNOSIS — I480 Paroxysmal atrial fibrillation: Secondary | ICD-10-CM | POA: Diagnosis not present

## 2017-04-26 DIAGNOSIS — N186 End stage renal disease: Secondary | ICD-10-CM | POA: Diagnosis not present

## 2017-04-27 DIAGNOSIS — I1311 Hypertensive heart and chronic kidney disease without heart failure, with stage 5 chronic kidney disease, or end stage renal disease: Secondary | ICD-10-CM | POA: Diagnosis not present

## 2017-04-27 DIAGNOSIS — I480 Paroxysmal atrial fibrillation: Secondary | ICD-10-CM | POA: Diagnosis not present

## 2017-04-27 DIAGNOSIS — D631 Anemia in chronic kidney disease: Secondary | ICD-10-CM | POA: Diagnosis not present

## 2017-04-27 DIAGNOSIS — N186 End stage renal disease: Secondary | ICD-10-CM | POA: Diagnosis not present

## 2017-04-27 DIAGNOSIS — I2510 Atherosclerotic heart disease of native coronary artery without angina pectoris: Secondary | ICD-10-CM | POA: Diagnosis not present

## 2017-04-27 DIAGNOSIS — I471 Supraventricular tachycardia: Secondary | ICD-10-CM | POA: Diagnosis not present

## 2017-04-28 DIAGNOSIS — I1311 Hypertensive heart and chronic kidney disease without heart failure, with stage 5 chronic kidney disease, or end stage renal disease: Secondary | ICD-10-CM | POA: Diagnosis not present

## 2017-04-28 DIAGNOSIS — D631 Anemia in chronic kidney disease: Secondary | ICD-10-CM | POA: Diagnosis not present

## 2017-04-28 DIAGNOSIS — I2510 Atherosclerotic heart disease of native coronary artery without angina pectoris: Secondary | ICD-10-CM | POA: Diagnosis not present

## 2017-04-28 DIAGNOSIS — I471 Supraventricular tachycardia: Secondary | ICD-10-CM | POA: Diagnosis not present

## 2017-04-28 DIAGNOSIS — I480 Paroxysmal atrial fibrillation: Secondary | ICD-10-CM | POA: Diagnosis not present

## 2017-04-28 DIAGNOSIS — N186 End stage renal disease: Secondary | ICD-10-CM | POA: Diagnosis not present

## 2017-04-29 DIAGNOSIS — I480 Paroxysmal atrial fibrillation: Secondary | ICD-10-CM | POA: Diagnosis not present

## 2017-04-29 DIAGNOSIS — I471 Supraventricular tachycardia: Secondary | ICD-10-CM | POA: Diagnosis not present

## 2017-04-29 DIAGNOSIS — D631 Anemia in chronic kidney disease: Secondary | ICD-10-CM | POA: Diagnosis not present

## 2017-04-29 DIAGNOSIS — N186 End stage renal disease: Secondary | ICD-10-CM | POA: Diagnosis not present

## 2017-04-29 DIAGNOSIS — I2510 Atherosclerotic heart disease of native coronary artery without angina pectoris: Secondary | ICD-10-CM | POA: Diagnosis not present

## 2017-04-29 DIAGNOSIS — I1311 Hypertensive heart and chronic kidney disease without heart failure, with stage 5 chronic kidney disease, or end stage renal disease: Secondary | ICD-10-CM | POA: Diagnosis not present

## 2017-04-30 DIAGNOSIS — I471 Supraventricular tachycardia: Secondary | ICD-10-CM | POA: Diagnosis not present

## 2017-04-30 DIAGNOSIS — D631 Anemia in chronic kidney disease: Secondary | ICD-10-CM | POA: Diagnosis not present

## 2017-04-30 DIAGNOSIS — N186 End stage renal disease: Secondary | ICD-10-CM | POA: Diagnosis not present

## 2017-04-30 DIAGNOSIS — I480 Paroxysmal atrial fibrillation: Secondary | ICD-10-CM | POA: Diagnosis not present

## 2017-04-30 DIAGNOSIS — I1311 Hypertensive heart and chronic kidney disease without heart failure, with stage 5 chronic kidney disease, or end stage renal disease: Secondary | ICD-10-CM | POA: Diagnosis not present

## 2017-04-30 DIAGNOSIS — I2510 Atherosclerotic heart disease of native coronary artery without angina pectoris: Secondary | ICD-10-CM | POA: Diagnosis not present

## 2017-05-03 DIAGNOSIS — I480 Paroxysmal atrial fibrillation: Secondary | ICD-10-CM | POA: Diagnosis not present

## 2017-05-03 DIAGNOSIS — D631 Anemia in chronic kidney disease: Secondary | ICD-10-CM | POA: Diagnosis not present

## 2017-05-03 DIAGNOSIS — N186 End stage renal disease: Secondary | ICD-10-CM | POA: Diagnosis not present

## 2017-05-03 DIAGNOSIS — I1311 Hypertensive heart and chronic kidney disease without heart failure, with stage 5 chronic kidney disease, or end stage renal disease: Secondary | ICD-10-CM | POA: Diagnosis not present

## 2017-05-03 DIAGNOSIS — I471 Supraventricular tachycardia: Secondary | ICD-10-CM | POA: Diagnosis not present

## 2017-05-03 DIAGNOSIS — I2510 Atherosclerotic heart disease of native coronary artery without angina pectoris: Secondary | ICD-10-CM | POA: Diagnosis not present

## 2017-05-04 DIAGNOSIS — I1311 Hypertensive heart and chronic kidney disease without heart failure, with stage 5 chronic kidney disease, or end stage renal disease: Secondary | ICD-10-CM | POA: Diagnosis not present

## 2017-05-04 DIAGNOSIS — I480 Paroxysmal atrial fibrillation: Secondary | ICD-10-CM | POA: Diagnosis not present

## 2017-05-04 DIAGNOSIS — D631 Anemia in chronic kidney disease: Secondary | ICD-10-CM | POA: Diagnosis not present

## 2017-05-04 DIAGNOSIS — I2510 Atherosclerotic heart disease of native coronary artery without angina pectoris: Secondary | ICD-10-CM | POA: Diagnosis not present

## 2017-05-04 DIAGNOSIS — N186 End stage renal disease: Secondary | ICD-10-CM | POA: Diagnosis not present

## 2017-05-04 DIAGNOSIS — I471 Supraventricular tachycardia: Secondary | ICD-10-CM | POA: Diagnosis not present

## 2017-05-05 DIAGNOSIS — I2510 Atherosclerotic heart disease of native coronary artery without angina pectoris: Secondary | ICD-10-CM | POA: Diagnosis not present

## 2017-05-05 DIAGNOSIS — I1311 Hypertensive heart and chronic kidney disease without heart failure, with stage 5 chronic kidney disease, or end stage renal disease: Secondary | ICD-10-CM | POA: Diagnosis not present

## 2017-05-05 DIAGNOSIS — D631 Anemia in chronic kidney disease: Secondary | ICD-10-CM | POA: Diagnosis not present

## 2017-05-05 DIAGNOSIS — I480 Paroxysmal atrial fibrillation: Secondary | ICD-10-CM | POA: Diagnosis not present

## 2017-05-05 DIAGNOSIS — N186 End stage renal disease: Secondary | ICD-10-CM | POA: Diagnosis not present

## 2017-05-05 DIAGNOSIS — I471 Supraventricular tachycardia: Secondary | ICD-10-CM | POA: Diagnosis not present

## 2017-05-06 DIAGNOSIS — D631 Anemia in chronic kidney disease: Secondary | ICD-10-CM | POA: Diagnosis not present

## 2017-05-06 DIAGNOSIS — I1311 Hypertensive heart and chronic kidney disease without heart failure, with stage 5 chronic kidney disease, or end stage renal disease: Secondary | ICD-10-CM | POA: Diagnosis not present

## 2017-05-06 DIAGNOSIS — I471 Supraventricular tachycardia: Secondary | ICD-10-CM | POA: Diagnosis not present

## 2017-05-06 DIAGNOSIS — I480 Paroxysmal atrial fibrillation: Secondary | ICD-10-CM | POA: Diagnosis not present

## 2017-05-06 DIAGNOSIS — I2510 Atherosclerotic heart disease of native coronary artery without angina pectoris: Secondary | ICD-10-CM | POA: Diagnosis not present

## 2017-05-06 DIAGNOSIS — N186 End stage renal disease: Secondary | ICD-10-CM | POA: Diagnosis not present

## 2017-05-07 DIAGNOSIS — D631 Anemia in chronic kidney disease: Secondary | ICD-10-CM | POA: Diagnosis not present

## 2017-05-07 DIAGNOSIS — I471 Supraventricular tachycardia: Secondary | ICD-10-CM | POA: Diagnosis not present

## 2017-05-07 DIAGNOSIS — I480 Paroxysmal atrial fibrillation: Secondary | ICD-10-CM | POA: Diagnosis not present

## 2017-05-07 DIAGNOSIS — N186 End stage renal disease: Secondary | ICD-10-CM | POA: Diagnosis not present

## 2017-05-07 DIAGNOSIS — I2510 Atherosclerotic heart disease of native coronary artery without angina pectoris: Secondary | ICD-10-CM | POA: Diagnosis not present

## 2017-05-07 DIAGNOSIS — I1311 Hypertensive heart and chronic kidney disease without heart failure, with stage 5 chronic kidney disease, or end stage renal disease: Secondary | ICD-10-CM | POA: Diagnosis not present

## 2017-05-10 DIAGNOSIS — I480 Paroxysmal atrial fibrillation: Secondary | ICD-10-CM | POA: Diagnosis not present

## 2017-05-10 DIAGNOSIS — N186 End stage renal disease: Secondary | ICD-10-CM | POA: Diagnosis not present

## 2017-05-10 DIAGNOSIS — I1311 Hypertensive heart and chronic kidney disease without heart failure, with stage 5 chronic kidney disease, or end stage renal disease: Secondary | ICD-10-CM | POA: Diagnosis not present

## 2017-05-10 DIAGNOSIS — D631 Anemia in chronic kidney disease: Secondary | ICD-10-CM | POA: Diagnosis not present

## 2017-05-10 DIAGNOSIS — I2510 Atherosclerotic heart disease of native coronary artery without angina pectoris: Secondary | ICD-10-CM | POA: Diagnosis not present

## 2017-05-10 DIAGNOSIS — I471 Supraventricular tachycardia: Secondary | ICD-10-CM | POA: Diagnosis not present

## 2017-05-11 DIAGNOSIS — I480 Paroxysmal atrial fibrillation: Secondary | ICD-10-CM | POA: Diagnosis not present

## 2017-05-11 DIAGNOSIS — D631 Anemia in chronic kidney disease: Secondary | ICD-10-CM | POA: Diagnosis not present

## 2017-05-11 DIAGNOSIS — I471 Supraventricular tachycardia: Secondary | ICD-10-CM | POA: Diagnosis not present

## 2017-05-11 DIAGNOSIS — I1311 Hypertensive heart and chronic kidney disease without heart failure, with stage 5 chronic kidney disease, or end stage renal disease: Secondary | ICD-10-CM | POA: Diagnosis not present

## 2017-05-11 DIAGNOSIS — N186 End stage renal disease: Secondary | ICD-10-CM | POA: Diagnosis not present

## 2017-05-11 DIAGNOSIS — I2510 Atherosclerotic heart disease of native coronary artery without angina pectoris: Secondary | ICD-10-CM | POA: Diagnosis not present

## 2017-05-12 DIAGNOSIS — N186 End stage renal disease: Secondary | ICD-10-CM | POA: Diagnosis not present

## 2017-05-12 DIAGNOSIS — I471 Supraventricular tachycardia: Secondary | ICD-10-CM | POA: Diagnosis not present

## 2017-05-12 DIAGNOSIS — I1311 Hypertensive heart and chronic kidney disease without heart failure, with stage 5 chronic kidney disease, or end stage renal disease: Secondary | ICD-10-CM | POA: Diagnosis not present

## 2017-05-12 DIAGNOSIS — D631 Anemia in chronic kidney disease: Secondary | ICD-10-CM | POA: Diagnosis not present

## 2017-05-12 DIAGNOSIS — I2510 Atherosclerotic heart disease of native coronary artery without angina pectoris: Secondary | ICD-10-CM | POA: Diagnosis not present

## 2017-05-12 DIAGNOSIS — I480 Paroxysmal atrial fibrillation: Secondary | ICD-10-CM | POA: Diagnosis not present

## 2017-05-13 DIAGNOSIS — I480 Paroxysmal atrial fibrillation: Secondary | ICD-10-CM | POA: Diagnosis not present

## 2017-05-13 DIAGNOSIS — I471 Supraventricular tachycardia: Secondary | ICD-10-CM | POA: Diagnosis not present

## 2017-05-13 DIAGNOSIS — I2510 Atherosclerotic heart disease of native coronary artery without angina pectoris: Secondary | ICD-10-CM | POA: Diagnosis not present

## 2017-05-13 DIAGNOSIS — I1311 Hypertensive heart and chronic kidney disease without heart failure, with stage 5 chronic kidney disease, or end stage renal disease: Secondary | ICD-10-CM | POA: Diagnosis not present

## 2017-05-13 DIAGNOSIS — N186 End stage renal disease: Secondary | ICD-10-CM | POA: Diagnosis not present

## 2017-05-13 DIAGNOSIS — D631 Anemia in chronic kidney disease: Secondary | ICD-10-CM | POA: Diagnosis not present

## 2017-05-14 DIAGNOSIS — I2510 Atherosclerotic heart disease of native coronary artery without angina pectoris: Secondary | ICD-10-CM | POA: Diagnosis not present

## 2017-05-14 DIAGNOSIS — I471 Supraventricular tachycardia: Secondary | ICD-10-CM | POA: Diagnosis not present

## 2017-05-14 DIAGNOSIS — D631 Anemia in chronic kidney disease: Secondary | ICD-10-CM | POA: Diagnosis not present

## 2017-05-14 DIAGNOSIS — I480 Paroxysmal atrial fibrillation: Secondary | ICD-10-CM | POA: Diagnosis not present

## 2017-05-14 DIAGNOSIS — N186 End stage renal disease: Secondary | ICD-10-CM | POA: Diagnosis not present

## 2017-05-14 DIAGNOSIS — I1311 Hypertensive heart and chronic kidney disease without heart failure, with stage 5 chronic kidney disease, or end stage renal disease: Secondary | ICD-10-CM | POA: Diagnosis not present

## 2017-05-17 DIAGNOSIS — I471 Supraventricular tachycardia: Secondary | ICD-10-CM | POA: Diagnosis not present

## 2017-05-17 DIAGNOSIS — N186 End stage renal disease: Secondary | ICD-10-CM | POA: Diagnosis not present

## 2017-05-17 DIAGNOSIS — D631 Anemia in chronic kidney disease: Secondary | ICD-10-CM | POA: Diagnosis not present

## 2017-05-17 DIAGNOSIS — I480 Paroxysmal atrial fibrillation: Secondary | ICD-10-CM | POA: Diagnosis not present

## 2017-05-17 DIAGNOSIS — I1311 Hypertensive heart and chronic kidney disease without heart failure, with stage 5 chronic kidney disease, or end stage renal disease: Secondary | ICD-10-CM | POA: Diagnosis not present

## 2017-05-17 DIAGNOSIS — I2510 Atherosclerotic heart disease of native coronary artery without angina pectoris: Secondary | ICD-10-CM | POA: Diagnosis not present

## 2017-05-18 DIAGNOSIS — D631 Anemia in chronic kidney disease: Secondary | ICD-10-CM | POA: Diagnosis not present

## 2017-05-18 DIAGNOSIS — I471 Supraventricular tachycardia: Secondary | ICD-10-CM | POA: Diagnosis not present

## 2017-05-18 DIAGNOSIS — N186 End stage renal disease: Secondary | ICD-10-CM | POA: Diagnosis not present

## 2017-05-18 DIAGNOSIS — I2510 Atherosclerotic heart disease of native coronary artery without angina pectoris: Secondary | ICD-10-CM | POA: Diagnosis not present

## 2017-05-18 DIAGNOSIS — I480 Paroxysmal atrial fibrillation: Secondary | ICD-10-CM | POA: Diagnosis not present

## 2017-05-18 DIAGNOSIS — I1311 Hypertensive heart and chronic kidney disease without heart failure, with stage 5 chronic kidney disease, or end stage renal disease: Secondary | ICD-10-CM | POA: Diagnosis not present

## 2017-05-19 DIAGNOSIS — I471 Supraventricular tachycardia: Secondary | ICD-10-CM | POA: Diagnosis not present

## 2017-05-19 DIAGNOSIS — D631 Anemia in chronic kidney disease: Secondary | ICD-10-CM | POA: Diagnosis not present

## 2017-05-19 DIAGNOSIS — I480 Paroxysmal atrial fibrillation: Secondary | ICD-10-CM | POA: Diagnosis not present

## 2017-05-19 DIAGNOSIS — I1311 Hypertensive heart and chronic kidney disease without heart failure, with stage 5 chronic kidney disease, or end stage renal disease: Secondary | ICD-10-CM | POA: Diagnosis not present

## 2017-05-19 DIAGNOSIS — N186 End stage renal disease: Secondary | ICD-10-CM | POA: Diagnosis not present

## 2017-05-19 DIAGNOSIS — I2510 Atherosclerotic heart disease of native coronary artery without angina pectoris: Secondary | ICD-10-CM | POA: Diagnosis not present

## 2017-05-20 DIAGNOSIS — N186 End stage renal disease: Secondary | ICD-10-CM | POA: Diagnosis not present

## 2017-05-20 DIAGNOSIS — I480 Paroxysmal atrial fibrillation: Secondary | ICD-10-CM | POA: Diagnosis not present

## 2017-05-20 DIAGNOSIS — I2510 Atherosclerotic heart disease of native coronary artery without angina pectoris: Secondary | ICD-10-CM | POA: Diagnosis not present

## 2017-05-20 DIAGNOSIS — D631 Anemia in chronic kidney disease: Secondary | ICD-10-CM | POA: Diagnosis not present

## 2017-05-20 DIAGNOSIS — I1311 Hypertensive heart and chronic kidney disease without heart failure, with stage 5 chronic kidney disease, or end stage renal disease: Secondary | ICD-10-CM | POA: Diagnosis not present

## 2017-05-20 DIAGNOSIS — I471 Supraventricular tachycardia: Secondary | ICD-10-CM | POA: Diagnosis not present

## 2017-05-21 DIAGNOSIS — I1311 Hypertensive heart and chronic kidney disease without heart failure, with stage 5 chronic kidney disease, or end stage renal disease: Secondary | ICD-10-CM | POA: Diagnosis not present

## 2017-05-21 DIAGNOSIS — I471 Supraventricular tachycardia: Secondary | ICD-10-CM | POA: Diagnosis not present

## 2017-05-21 DIAGNOSIS — N186 End stage renal disease: Secondary | ICD-10-CM | POA: Diagnosis not present

## 2017-05-21 DIAGNOSIS — I2510 Atherosclerotic heart disease of native coronary artery without angina pectoris: Secondary | ICD-10-CM | POA: Diagnosis not present

## 2017-05-21 DIAGNOSIS — D631 Anemia in chronic kidney disease: Secondary | ICD-10-CM | POA: Diagnosis not present

## 2017-05-21 DIAGNOSIS — I480 Paroxysmal atrial fibrillation: Secondary | ICD-10-CM | POA: Diagnosis not present

## 2017-05-22 DIAGNOSIS — I2510 Atherosclerotic heart disease of native coronary artery without angina pectoris: Secondary | ICD-10-CM | POA: Diagnosis not present

## 2017-05-22 DIAGNOSIS — I1311 Hypertensive heart and chronic kidney disease without heart failure, with stage 5 chronic kidney disease, or end stage renal disease: Secondary | ICD-10-CM | POA: Diagnosis not present

## 2017-05-22 DIAGNOSIS — I361 Nonrheumatic tricuspid (valve) insufficiency: Secondary | ICD-10-CM | POA: Diagnosis not present

## 2017-05-22 DIAGNOSIS — Z9181 History of falling: Secondary | ICD-10-CM | POA: Diagnosis not present

## 2017-05-22 DIAGNOSIS — I480 Paroxysmal atrial fibrillation: Secondary | ICD-10-CM | POA: Diagnosis not present

## 2017-05-22 DIAGNOSIS — E039 Hypothyroidism, unspecified: Secondary | ICD-10-CM | POA: Diagnosis not present

## 2017-05-22 DIAGNOSIS — Z6820 Body mass index (BMI) 20.0-20.9, adult: Secondary | ICD-10-CM | POA: Diagnosis not present

## 2017-05-22 DIAGNOSIS — D631 Anemia in chronic kidney disease: Secondary | ICD-10-CM | POA: Diagnosis not present

## 2017-05-22 DIAGNOSIS — I340 Nonrheumatic mitral (valve) insufficiency: Secondary | ICD-10-CM | POA: Diagnosis not present

## 2017-05-22 DIAGNOSIS — F329 Major depressive disorder, single episode, unspecified: Secondary | ICD-10-CM | POA: Diagnosis not present

## 2017-05-22 DIAGNOSIS — E785 Hyperlipidemia, unspecified: Secondary | ICD-10-CM | POA: Diagnosis not present

## 2017-05-22 DIAGNOSIS — B372 Candidiasis of skin and nail: Secondary | ICD-10-CM | POA: Diagnosis not present

## 2017-05-22 DIAGNOSIS — E559 Vitamin D deficiency, unspecified: Secondary | ICD-10-CM | POA: Diagnosis not present

## 2017-05-22 DIAGNOSIS — Z7982 Long term (current) use of aspirin: Secondary | ICD-10-CM | POA: Diagnosis not present

## 2017-05-22 DIAGNOSIS — Z9981 Dependence on supplemental oxygen: Secondary | ICD-10-CM | POA: Diagnosis not present

## 2017-05-22 DIAGNOSIS — I471 Supraventricular tachycardia: Secondary | ICD-10-CM | POA: Diagnosis not present

## 2017-05-22 DIAGNOSIS — M109 Gout, unspecified: Secondary | ICD-10-CM | POA: Diagnosis not present

## 2017-05-22 DIAGNOSIS — N186 End stage renal disease: Secondary | ICD-10-CM | POA: Diagnosis not present

## 2017-05-24 DIAGNOSIS — I1311 Hypertensive heart and chronic kidney disease without heart failure, with stage 5 chronic kidney disease, or end stage renal disease: Secondary | ICD-10-CM | POA: Diagnosis not present

## 2017-05-24 DIAGNOSIS — I471 Supraventricular tachycardia: Secondary | ICD-10-CM | POA: Diagnosis not present

## 2017-05-24 DIAGNOSIS — N186 End stage renal disease: Secondary | ICD-10-CM | POA: Diagnosis not present

## 2017-05-24 DIAGNOSIS — D631 Anemia in chronic kidney disease: Secondary | ICD-10-CM | POA: Diagnosis not present

## 2017-05-24 DIAGNOSIS — I2510 Atherosclerotic heart disease of native coronary artery without angina pectoris: Secondary | ICD-10-CM | POA: Diagnosis not present

## 2017-05-24 DIAGNOSIS — I480 Paroxysmal atrial fibrillation: Secondary | ICD-10-CM | POA: Diagnosis not present

## 2017-05-25 DIAGNOSIS — N184 Chronic kidney disease, stage 4 (severe): Secondary | ICD-10-CM | POA: Diagnosis not present

## 2017-05-25 DIAGNOSIS — N186 End stage renal disease: Secondary | ICD-10-CM | POA: Diagnosis not present

## 2017-05-25 DIAGNOSIS — I1311 Hypertensive heart and chronic kidney disease without heart failure, with stage 5 chronic kidney disease, or end stage renal disease: Secondary | ICD-10-CM | POA: Diagnosis not present

## 2017-05-25 DIAGNOSIS — D638 Anemia in other chronic diseases classified elsewhere: Secondary | ICD-10-CM | POA: Diagnosis not present

## 2017-05-25 DIAGNOSIS — I471 Supraventricular tachycardia: Secondary | ICD-10-CM | POA: Diagnosis not present

## 2017-05-25 DIAGNOSIS — D631 Anemia in chronic kidney disease: Secondary | ICD-10-CM | POA: Diagnosis not present

## 2017-05-25 DIAGNOSIS — I480 Paroxysmal atrial fibrillation: Secondary | ICD-10-CM | POA: Diagnosis not present

## 2017-05-25 DIAGNOSIS — I2510 Atherosclerotic heart disease of native coronary artery without angina pectoris: Secondary | ICD-10-CM | POA: Diagnosis not present

## 2017-05-25 DIAGNOSIS — E038 Other specified hypothyroidism: Secondary | ICD-10-CM | POA: Diagnosis not present

## 2017-05-26 DIAGNOSIS — I2510 Atherosclerotic heart disease of native coronary artery without angina pectoris: Secondary | ICD-10-CM | POA: Diagnosis not present

## 2017-05-26 DIAGNOSIS — I480 Paroxysmal atrial fibrillation: Secondary | ICD-10-CM | POA: Diagnosis not present

## 2017-05-26 DIAGNOSIS — N186 End stage renal disease: Secondary | ICD-10-CM | POA: Diagnosis not present

## 2017-05-26 DIAGNOSIS — I471 Supraventricular tachycardia: Secondary | ICD-10-CM | POA: Diagnosis not present

## 2017-05-26 DIAGNOSIS — I1311 Hypertensive heart and chronic kidney disease without heart failure, with stage 5 chronic kidney disease, or end stage renal disease: Secondary | ICD-10-CM | POA: Diagnosis not present

## 2017-05-26 DIAGNOSIS — D631 Anemia in chronic kidney disease: Secondary | ICD-10-CM | POA: Diagnosis not present

## 2017-05-26 DIAGNOSIS — D649 Anemia, unspecified: Secondary | ICD-10-CM | POA: Diagnosis not present

## 2017-05-27 DIAGNOSIS — I1311 Hypertensive heart and chronic kidney disease without heart failure, with stage 5 chronic kidney disease, or end stage renal disease: Secondary | ICD-10-CM | POA: Diagnosis not present

## 2017-05-27 DIAGNOSIS — I471 Supraventricular tachycardia: Secondary | ICD-10-CM | POA: Diagnosis not present

## 2017-05-27 DIAGNOSIS — I480 Paroxysmal atrial fibrillation: Secondary | ICD-10-CM | POA: Diagnosis not present

## 2017-05-27 DIAGNOSIS — I2510 Atherosclerotic heart disease of native coronary artery without angina pectoris: Secondary | ICD-10-CM | POA: Diagnosis not present

## 2017-05-27 DIAGNOSIS — N186 End stage renal disease: Secondary | ICD-10-CM | POA: Diagnosis not present

## 2017-05-27 DIAGNOSIS — D631 Anemia in chronic kidney disease: Secondary | ICD-10-CM | POA: Diagnosis not present

## 2017-05-28 DIAGNOSIS — I471 Supraventricular tachycardia: Secondary | ICD-10-CM | POA: Diagnosis not present

## 2017-05-28 DIAGNOSIS — I2510 Atherosclerotic heart disease of native coronary artery without angina pectoris: Secondary | ICD-10-CM | POA: Diagnosis not present

## 2017-05-28 DIAGNOSIS — I1311 Hypertensive heart and chronic kidney disease without heart failure, with stage 5 chronic kidney disease, or end stage renal disease: Secondary | ICD-10-CM | POA: Diagnosis not present

## 2017-05-28 DIAGNOSIS — I480 Paroxysmal atrial fibrillation: Secondary | ICD-10-CM | POA: Diagnosis not present

## 2017-05-28 DIAGNOSIS — D631 Anemia in chronic kidney disease: Secondary | ICD-10-CM | POA: Diagnosis not present

## 2017-05-28 DIAGNOSIS — N186 End stage renal disease: Secondary | ICD-10-CM | POA: Diagnosis not present

## 2017-06-01 DIAGNOSIS — I1311 Hypertensive heart and chronic kidney disease without heart failure, with stage 5 chronic kidney disease, or end stage renal disease: Secondary | ICD-10-CM | POA: Diagnosis not present

## 2017-06-01 DIAGNOSIS — D631 Anemia in chronic kidney disease: Secondary | ICD-10-CM | POA: Diagnosis not present

## 2017-06-01 DIAGNOSIS — I480 Paroxysmal atrial fibrillation: Secondary | ICD-10-CM | POA: Diagnosis not present

## 2017-06-01 DIAGNOSIS — I2510 Atherosclerotic heart disease of native coronary artery without angina pectoris: Secondary | ICD-10-CM | POA: Diagnosis not present

## 2017-06-01 DIAGNOSIS — I471 Supraventricular tachycardia: Secondary | ICD-10-CM | POA: Diagnosis not present

## 2017-06-01 DIAGNOSIS — N186 End stage renal disease: Secondary | ICD-10-CM | POA: Diagnosis not present

## 2017-06-02 DIAGNOSIS — I480 Paroxysmal atrial fibrillation: Secondary | ICD-10-CM | POA: Diagnosis not present

## 2017-06-02 DIAGNOSIS — I471 Supraventricular tachycardia: Secondary | ICD-10-CM | POA: Diagnosis not present

## 2017-06-02 DIAGNOSIS — I2510 Atherosclerotic heart disease of native coronary artery without angina pectoris: Secondary | ICD-10-CM | POA: Diagnosis not present

## 2017-06-02 DIAGNOSIS — N186 End stage renal disease: Secondary | ICD-10-CM | POA: Diagnosis not present

## 2017-06-02 DIAGNOSIS — I1311 Hypertensive heart and chronic kidney disease without heart failure, with stage 5 chronic kidney disease, or end stage renal disease: Secondary | ICD-10-CM | POA: Diagnosis not present

## 2017-06-02 DIAGNOSIS — D631 Anemia in chronic kidney disease: Secondary | ICD-10-CM | POA: Diagnosis not present

## 2017-06-03 DIAGNOSIS — I1311 Hypertensive heart and chronic kidney disease without heart failure, with stage 5 chronic kidney disease, or end stage renal disease: Secondary | ICD-10-CM | POA: Diagnosis not present

## 2017-06-03 DIAGNOSIS — I2510 Atherosclerotic heart disease of native coronary artery without angina pectoris: Secondary | ICD-10-CM | POA: Diagnosis not present

## 2017-06-03 DIAGNOSIS — N186 End stage renal disease: Secondary | ICD-10-CM | POA: Diagnosis not present

## 2017-06-03 DIAGNOSIS — I471 Supraventricular tachycardia: Secondary | ICD-10-CM | POA: Diagnosis not present

## 2017-06-03 DIAGNOSIS — I480 Paroxysmal atrial fibrillation: Secondary | ICD-10-CM | POA: Diagnosis not present

## 2017-06-03 DIAGNOSIS — D631 Anemia in chronic kidney disease: Secondary | ICD-10-CM | POA: Diagnosis not present

## 2017-06-04 DIAGNOSIS — N186 End stage renal disease: Secondary | ICD-10-CM | POA: Diagnosis not present

## 2017-06-04 DIAGNOSIS — I2510 Atherosclerotic heart disease of native coronary artery without angina pectoris: Secondary | ICD-10-CM | POA: Diagnosis not present

## 2017-06-04 DIAGNOSIS — D631 Anemia in chronic kidney disease: Secondary | ICD-10-CM | POA: Diagnosis not present

## 2017-06-04 DIAGNOSIS — I480 Paroxysmal atrial fibrillation: Secondary | ICD-10-CM | POA: Diagnosis not present

## 2017-06-04 DIAGNOSIS — I471 Supraventricular tachycardia: Secondary | ICD-10-CM | POA: Diagnosis not present

## 2017-06-04 DIAGNOSIS — I1311 Hypertensive heart and chronic kidney disease without heart failure, with stage 5 chronic kidney disease, or end stage renal disease: Secondary | ICD-10-CM | POA: Diagnosis not present

## 2017-06-07 DIAGNOSIS — I2510 Atherosclerotic heart disease of native coronary artery without angina pectoris: Secondary | ICD-10-CM | POA: Diagnosis not present

## 2017-06-07 DIAGNOSIS — N186 End stage renal disease: Secondary | ICD-10-CM | POA: Diagnosis not present

## 2017-06-07 DIAGNOSIS — I471 Supraventricular tachycardia: Secondary | ICD-10-CM | POA: Diagnosis not present

## 2017-06-07 DIAGNOSIS — I480 Paroxysmal atrial fibrillation: Secondary | ICD-10-CM | POA: Diagnosis not present

## 2017-06-07 DIAGNOSIS — D631 Anemia in chronic kidney disease: Secondary | ICD-10-CM | POA: Diagnosis not present

## 2017-06-07 DIAGNOSIS — I1311 Hypertensive heart and chronic kidney disease without heart failure, with stage 5 chronic kidney disease, or end stage renal disease: Secondary | ICD-10-CM | POA: Diagnosis not present

## 2017-06-08 DIAGNOSIS — I1311 Hypertensive heart and chronic kidney disease without heart failure, with stage 5 chronic kidney disease, or end stage renal disease: Secondary | ICD-10-CM | POA: Diagnosis not present

## 2017-06-08 DIAGNOSIS — I2510 Atherosclerotic heart disease of native coronary artery without angina pectoris: Secondary | ICD-10-CM | POA: Diagnosis not present

## 2017-06-08 DIAGNOSIS — D631 Anemia in chronic kidney disease: Secondary | ICD-10-CM | POA: Diagnosis not present

## 2017-06-08 DIAGNOSIS — I471 Supraventricular tachycardia: Secondary | ICD-10-CM | POA: Diagnosis not present

## 2017-06-08 DIAGNOSIS — I480 Paroxysmal atrial fibrillation: Secondary | ICD-10-CM | POA: Diagnosis not present

## 2017-06-08 DIAGNOSIS — N186 End stage renal disease: Secondary | ICD-10-CM | POA: Diagnosis not present

## 2017-06-09 DIAGNOSIS — I471 Supraventricular tachycardia: Secondary | ICD-10-CM | POA: Diagnosis not present

## 2017-06-09 DIAGNOSIS — I480 Paroxysmal atrial fibrillation: Secondary | ICD-10-CM | POA: Diagnosis not present

## 2017-06-09 DIAGNOSIS — D631 Anemia in chronic kidney disease: Secondary | ICD-10-CM | POA: Diagnosis not present

## 2017-06-09 DIAGNOSIS — I2510 Atherosclerotic heart disease of native coronary artery without angina pectoris: Secondary | ICD-10-CM | POA: Diagnosis not present

## 2017-06-09 DIAGNOSIS — I1311 Hypertensive heart and chronic kidney disease without heart failure, with stage 5 chronic kidney disease, or end stage renal disease: Secondary | ICD-10-CM | POA: Diagnosis not present

## 2017-06-09 DIAGNOSIS — N186 End stage renal disease: Secondary | ICD-10-CM | POA: Diagnosis not present

## 2017-06-10 DIAGNOSIS — I471 Supraventricular tachycardia: Secondary | ICD-10-CM | POA: Diagnosis not present

## 2017-06-10 DIAGNOSIS — N186 End stage renal disease: Secondary | ICD-10-CM | POA: Diagnosis not present

## 2017-06-10 DIAGNOSIS — I480 Paroxysmal atrial fibrillation: Secondary | ICD-10-CM | POA: Diagnosis not present

## 2017-06-10 DIAGNOSIS — D631 Anemia in chronic kidney disease: Secondary | ICD-10-CM | POA: Diagnosis not present

## 2017-06-10 DIAGNOSIS — I2510 Atherosclerotic heart disease of native coronary artery without angina pectoris: Secondary | ICD-10-CM | POA: Diagnosis not present

## 2017-06-10 DIAGNOSIS — I1311 Hypertensive heart and chronic kidney disease without heart failure, with stage 5 chronic kidney disease, or end stage renal disease: Secondary | ICD-10-CM | POA: Diagnosis not present

## 2017-06-11 DIAGNOSIS — I2510 Atherosclerotic heart disease of native coronary artery without angina pectoris: Secondary | ICD-10-CM | POA: Diagnosis not present

## 2017-06-11 DIAGNOSIS — D631 Anemia in chronic kidney disease: Secondary | ICD-10-CM | POA: Diagnosis not present

## 2017-06-11 DIAGNOSIS — I471 Supraventricular tachycardia: Secondary | ICD-10-CM | POA: Diagnosis not present

## 2017-06-11 DIAGNOSIS — I1311 Hypertensive heart and chronic kidney disease without heart failure, with stage 5 chronic kidney disease, or end stage renal disease: Secondary | ICD-10-CM | POA: Diagnosis not present

## 2017-06-11 DIAGNOSIS — N186 End stage renal disease: Secondary | ICD-10-CM | POA: Diagnosis not present

## 2017-06-11 DIAGNOSIS — I480 Paroxysmal atrial fibrillation: Secondary | ICD-10-CM | POA: Diagnosis not present

## 2017-06-14 DIAGNOSIS — I2510 Atherosclerotic heart disease of native coronary artery without angina pectoris: Secondary | ICD-10-CM | POA: Diagnosis not present

## 2017-06-14 DIAGNOSIS — I1311 Hypertensive heart and chronic kidney disease without heart failure, with stage 5 chronic kidney disease, or end stage renal disease: Secondary | ICD-10-CM | POA: Diagnosis not present

## 2017-06-14 DIAGNOSIS — D631 Anemia in chronic kidney disease: Secondary | ICD-10-CM | POA: Diagnosis not present

## 2017-06-14 DIAGNOSIS — I480 Paroxysmal atrial fibrillation: Secondary | ICD-10-CM | POA: Diagnosis not present

## 2017-06-14 DIAGNOSIS — I471 Supraventricular tachycardia: Secondary | ICD-10-CM | POA: Diagnosis not present

## 2017-06-14 DIAGNOSIS — N186 End stage renal disease: Secondary | ICD-10-CM | POA: Diagnosis not present

## 2017-06-15 DIAGNOSIS — I480 Paroxysmal atrial fibrillation: Secondary | ICD-10-CM | POA: Diagnosis not present

## 2017-06-15 DIAGNOSIS — I1311 Hypertensive heart and chronic kidney disease without heart failure, with stage 5 chronic kidney disease, or end stage renal disease: Secondary | ICD-10-CM | POA: Diagnosis not present

## 2017-06-15 DIAGNOSIS — N186 End stage renal disease: Secondary | ICD-10-CM | POA: Diagnosis not present

## 2017-06-15 DIAGNOSIS — D631 Anemia in chronic kidney disease: Secondary | ICD-10-CM | POA: Diagnosis not present

## 2017-06-15 DIAGNOSIS — I2510 Atherosclerotic heart disease of native coronary artery without angina pectoris: Secondary | ICD-10-CM | POA: Diagnosis not present

## 2017-06-15 DIAGNOSIS — I471 Supraventricular tachycardia: Secondary | ICD-10-CM | POA: Diagnosis not present

## 2017-06-16 DIAGNOSIS — N186 End stage renal disease: Secondary | ICD-10-CM | POA: Diagnosis not present

## 2017-06-16 DIAGNOSIS — I2510 Atherosclerotic heart disease of native coronary artery without angina pectoris: Secondary | ICD-10-CM | POA: Diagnosis not present

## 2017-06-16 DIAGNOSIS — D631 Anemia in chronic kidney disease: Secondary | ICD-10-CM | POA: Diagnosis not present

## 2017-06-16 DIAGNOSIS — I480 Paroxysmal atrial fibrillation: Secondary | ICD-10-CM | POA: Diagnosis not present

## 2017-06-16 DIAGNOSIS — I471 Supraventricular tachycardia: Secondary | ICD-10-CM | POA: Diagnosis not present

## 2017-06-16 DIAGNOSIS — I1311 Hypertensive heart and chronic kidney disease without heart failure, with stage 5 chronic kidney disease, or end stage renal disease: Secondary | ICD-10-CM | POA: Diagnosis not present

## 2017-06-17 DIAGNOSIS — I471 Supraventricular tachycardia: Secondary | ICD-10-CM | POA: Diagnosis not present

## 2017-06-17 DIAGNOSIS — N186 End stage renal disease: Secondary | ICD-10-CM | POA: Diagnosis not present

## 2017-06-17 DIAGNOSIS — D631 Anemia in chronic kidney disease: Secondary | ICD-10-CM | POA: Diagnosis not present

## 2017-06-17 DIAGNOSIS — I480 Paroxysmal atrial fibrillation: Secondary | ICD-10-CM | POA: Diagnosis not present

## 2017-06-17 DIAGNOSIS — I2510 Atherosclerotic heart disease of native coronary artery without angina pectoris: Secondary | ICD-10-CM | POA: Diagnosis not present

## 2017-06-17 DIAGNOSIS — I1311 Hypertensive heart and chronic kidney disease without heart failure, with stage 5 chronic kidney disease, or end stage renal disease: Secondary | ICD-10-CM | POA: Diagnosis not present

## 2017-06-18 DIAGNOSIS — D631 Anemia in chronic kidney disease: Secondary | ICD-10-CM | POA: Diagnosis not present

## 2017-06-18 DIAGNOSIS — N186 End stage renal disease: Secondary | ICD-10-CM | POA: Diagnosis not present

## 2017-06-18 DIAGNOSIS — I1311 Hypertensive heart and chronic kidney disease without heart failure, with stage 5 chronic kidney disease, or end stage renal disease: Secondary | ICD-10-CM | POA: Diagnosis not present

## 2017-06-18 DIAGNOSIS — D649 Anemia, unspecified: Secondary | ICD-10-CM | POA: Diagnosis not present

## 2017-06-18 DIAGNOSIS — I480 Paroxysmal atrial fibrillation: Secondary | ICD-10-CM | POA: Diagnosis not present

## 2017-06-18 DIAGNOSIS — D509 Iron deficiency anemia, unspecified: Secondary | ICD-10-CM | POA: Diagnosis not present

## 2017-06-18 DIAGNOSIS — I471 Supraventricular tachycardia: Secondary | ICD-10-CM | POA: Diagnosis not present

## 2017-06-18 DIAGNOSIS — I2510 Atherosclerotic heart disease of native coronary artery without angina pectoris: Secondary | ICD-10-CM | POA: Diagnosis not present

## 2017-06-21 DIAGNOSIS — I471 Supraventricular tachycardia: Secondary | ICD-10-CM | POA: Diagnosis not present

## 2017-06-21 DIAGNOSIS — I1311 Hypertensive heart and chronic kidney disease without heart failure, with stage 5 chronic kidney disease, or end stage renal disease: Secondary | ICD-10-CM | POA: Diagnosis not present

## 2017-06-21 DIAGNOSIS — I480 Paroxysmal atrial fibrillation: Secondary | ICD-10-CM | POA: Diagnosis not present

## 2017-06-21 DIAGNOSIS — D631 Anemia in chronic kidney disease: Secondary | ICD-10-CM | POA: Diagnosis not present

## 2017-06-21 DIAGNOSIS — N186 End stage renal disease: Secondary | ICD-10-CM | POA: Diagnosis not present

## 2017-06-21 DIAGNOSIS — I2510 Atherosclerotic heart disease of native coronary artery without angina pectoris: Secondary | ICD-10-CM | POA: Diagnosis not present

## 2017-06-22 DIAGNOSIS — E559 Vitamin D deficiency, unspecified: Secondary | ICD-10-CM | POA: Diagnosis not present

## 2017-06-22 DIAGNOSIS — Z9181 History of falling: Secondary | ICD-10-CM | POA: Diagnosis not present

## 2017-06-22 DIAGNOSIS — I471 Supraventricular tachycardia: Secondary | ICD-10-CM | POA: Diagnosis not present

## 2017-06-22 DIAGNOSIS — B372 Candidiasis of skin and nail: Secondary | ICD-10-CM | POA: Diagnosis not present

## 2017-06-22 DIAGNOSIS — Z6820 Body mass index (BMI) 20.0-20.9, adult: Secondary | ICD-10-CM | POA: Diagnosis not present

## 2017-06-22 DIAGNOSIS — Z9981 Dependence on supplemental oxygen: Secondary | ICD-10-CM | POA: Diagnosis not present

## 2017-06-22 DIAGNOSIS — I2510 Atherosclerotic heart disease of native coronary artery without angina pectoris: Secondary | ICD-10-CM | POA: Diagnosis not present

## 2017-06-22 DIAGNOSIS — I1311 Hypertensive heart and chronic kidney disease without heart failure, with stage 5 chronic kidney disease, or end stage renal disease: Secondary | ICD-10-CM | POA: Diagnosis not present

## 2017-06-22 DIAGNOSIS — I361 Nonrheumatic tricuspid (valve) insufficiency: Secondary | ICD-10-CM | POA: Diagnosis not present

## 2017-06-22 DIAGNOSIS — F329 Major depressive disorder, single episode, unspecified: Secondary | ICD-10-CM | POA: Diagnosis not present

## 2017-06-22 DIAGNOSIS — I480 Paroxysmal atrial fibrillation: Secondary | ICD-10-CM | POA: Diagnosis not present

## 2017-06-22 DIAGNOSIS — M109 Gout, unspecified: Secondary | ICD-10-CM | POA: Diagnosis not present

## 2017-06-22 DIAGNOSIS — Z7982 Long term (current) use of aspirin: Secondary | ICD-10-CM | POA: Diagnosis not present

## 2017-06-22 DIAGNOSIS — D631 Anemia in chronic kidney disease: Secondary | ICD-10-CM | POA: Diagnosis not present

## 2017-06-22 DIAGNOSIS — E785 Hyperlipidemia, unspecified: Secondary | ICD-10-CM | POA: Diagnosis not present

## 2017-06-22 DIAGNOSIS — I340 Nonrheumatic mitral (valve) insufficiency: Secondary | ICD-10-CM | POA: Diagnosis not present

## 2017-06-22 DIAGNOSIS — E039 Hypothyroidism, unspecified: Secondary | ICD-10-CM | POA: Diagnosis not present

## 2017-06-22 DIAGNOSIS — N186 End stage renal disease: Secondary | ICD-10-CM | POA: Diagnosis not present

## 2017-06-23 DIAGNOSIS — D631 Anemia in chronic kidney disease: Secondary | ICD-10-CM | POA: Diagnosis not present

## 2017-06-23 DIAGNOSIS — I2510 Atherosclerotic heart disease of native coronary artery without angina pectoris: Secondary | ICD-10-CM | POA: Diagnosis not present

## 2017-06-23 DIAGNOSIS — I471 Supraventricular tachycardia: Secondary | ICD-10-CM | POA: Diagnosis not present

## 2017-06-23 DIAGNOSIS — I1311 Hypertensive heart and chronic kidney disease without heart failure, with stage 5 chronic kidney disease, or end stage renal disease: Secondary | ICD-10-CM | POA: Diagnosis not present

## 2017-06-23 DIAGNOSIS — I480 Paroxysmal atrial fibrillation: Secondary | ICD-10-CM | POA: Diagnosis not present

## 2017-06-23 DIAGNOSIS — N186 End stage renal disease: Secondary | ICD-10-CM | POA: Diagnosis not present

## 2017-06-24 DIAGNOSIS — I1311 Hypertensive heart and chronic kidney disease without heart failure, with stage 5 chronic kidney disease, or end stage renal disease: Secondary | ICD-10-CM | POA: Diagnosis not present

## 2017-06-24 DIAGNOSIS — I480 Paroxysmal atrial fibrillation: Secondary | ICD-10-CM | POA: Diagnosis not present

## 2017-06-24 DIAGNOSIS — D631 Anemia in chronic kidney disease: Secondary | ICD-10-CM | POA: Diagnosis not present

## 2017-06-24 DIAGNOSIS — N186 End stage renal disease: Secondary | ICD-10-CM | POA: Diagnosis not present

## 2017-06-24 DIAGNOSIS — I2510 Atherosclerotic heart disease of native coronary artery without angina pectoris: Secondary | ICD-10-CM | POA: Diagnosis not present

## 2017-06-24 DIAGNOSIS — I471 Supraventricular tachycardia: Secondary | ICD-10-CM | POA: Diagnosis not present

## 2017-06-25 DIAGNOSIS — I2510 Atherosclerotic heart disease of native coronary artery without angina pectoris: Secondary | ICD-10-CM | POA: Diagnosis not present

## 2017-06-25 DIAGNOSIS — D631 Anemia in chronic kidney disease: Secondary | ICD-10-CM | POA: Diagnosis not present

## 2017-06-25 DIAGNOSIS — I480 Paroxysmal atrial fibrillation: Secondary | ICD-10-CM | POA: Diagnosis not present

## 2017-06-25 DIAGNOSIS — N186 End stage renal disease: Secondary | ICD-10-CM | POA: Diagnosis not present

## 2017-06-25 DIAGNOSIS — I1311 Hypertensive heart and chronic kidney disease without heart failure, with stage 5 chronic kidney disease, or end stage renal disease: Secondary | ICD-10-CM | POA: Diagnosis not present

## 2017-06-25 DIAGNOSIS — I471 Supraventricular tachycardia: Secondary | ICD-10-CM | POA: Diagnosis not present

## 2017-06-28 DIAGNOSIS — I471 Supraventricular tachycardia: Secondary | ICD-10-CM | POA: Diagnosis not present

## 2017-06-28 DIAGNOSIS — I2510 Atherosclerotic heart disease of native coronary artery without angina pectoris: Secondary | ICD-10-CM | POA: Diagnosis not present

## 2017-06-28 DIAGNOSIS — I480 Paroxysmal atrial fibrillation: Secondary | ICD-10-CM | POA: Diagnosis not present

## 2017-06-28 DIAGNOSIS — I1311 Hypertensive heart and chronic kidney disease without heart failure, with stage 5 chronic kidney disease, or end stage renal disease: Secondary | ICD-10-CM | POA: Diagnosis not present

## 2017-06-28 DIAGNOSIS — N186 End stage renal disease: Secondary | ICD-10-CM | POA: Diagnosis not present

## 2017-06-28 DIAGNOSIS — D631 Anemia in chronic kidney disease: Secondary | ICD-10-CM | POA: Diagnosis not present

## 2017-06-29 DIAGNOSIS — I1311 Hypertensive heart and chronic kidney disease without heart failure, with stage 5 chronic kidney disease, or end stage renal disease: Secondary | ICD-10-CM | POA: Diagnosis not present

## 2017-06-29 DIAGNOSIS — I2510 Atherosclerotic heart disease of native coronary artery without angina pectoris: Secondary | ICD-10-CM | POA: Diagnosis not present

## 2017-06-29 DIAGNOSIS — N186 End stage renal disease: Secondary | ICD-10-CM | POA: Diagnosis not present

## 2017-06-29 DIAGNOSIS — I471 Supraventricular tachycardia: Secondary | ICD-10-CM | POA: Diagnosis not present

## 2017-06-29 DIAGNOSIS — D631 Anemia in chronic kidney disease: Secondary | ICD-10-CM | POA: Diagnosis not present

## 2017-06-29 DIAGNOSIS — I480 Paroxysmal atrial fibrillation: Secondary | ICD-10-CM | POA: Diagnosis not present

## 2017-06-30 DIAGNOSIS — N186 End stage renal disease: Secondary | ICD-10-CM | POA: Diagnosis not present

## 2017-06-30 DIAGNOSIS — I471 Supraventricular tachycardia: Secondary | ICD-10-CM | POA: Diagnosis not present

## 2017-06-30 DIAGNOSIS — I480 Paroxysmal atrial fibrillation: Secondary | ICD-10-CM | POA: Diagnosis not present

## 2017-06-30 DIAGNOSIS — I1311 Hypertensive heart and chronic kidney disease without heart failure, with stage 5 chronic kidney disease, or end stage renal disease: Secondary | ICD-10-CM | POA: Diagnosis not present

## 2017-06-30 DIAGNOSIS — I2510 Atherosclerotic heart disease of native coronary artery without angina pectoris: Secondary | ICD-10-CM | POA: Diagnosis not present

## 2017-06-30 DIAGNOSIS — D631 Anemia in chronic kidney disease: Secondary | ICD-10-CM | POA: Diagnosis not present

## 2017-07-01 DIAGNOSIS — I1311 Hypertensive heart and chronic kidney disease without heart failure, with stage 5 chronic kidney disease, or end stage renal disease: Secondary | ICD-10-CM | POA: Diagnosis not present

## 2017-07-01 DIAGNOSIS — I2510 Atherosclerotic heart disease of native coronary artery without angina pectoris: Secondary | ICD-10-CM | POA: Diagnosis not present

## 2017-07-01 DIAGNOSIS — I471 Supraventricular tachycardia: Secondary | ICD-10-CM | POA: Diagnosis not present

## 2017-07-01 DIAGNOSIS — D631 Anemia in chronic kidney disease: Secondary | ICD-10-CM | POA: Diagnosis not present

## 2017-07-01 DIAGNOSIS — N186 End stage renal disease: Secondary | ICD-10-CM | POA: Diagnosis not present

## 2017-07-01 DIAGNOSIS — I480 Paroxysmal atrial fibrillation: Secondary | ICD-10-CM | POA: Diagnosis not present

## 2017-07-02 DIAGNOSIS — I480 Paroxysmal atrial fibrillation: Secondary | ICD-10-CM | POA: Diagnosis not present

## 2017-07-02 DIAGNOSIS — N186 End stage renal disease: Secondary | ICD-10-CM | POA: Diagnosis not present

## 2017-07-02 DIAGNOSIS — I2510 Atherosclerotic heart disease of native coronary artery without angina pectoris: Secondary | ICD-10-CM | POA: Diagnosis not present

## 2017-07-02 DIAGNOSIS — D631 Anemia in chronic kidney disease: Secondary | ICD-10-CM | POA: Diagnosis not present

## 2017-07-02 DIAGNOSIS — I471 Supraventricular tachycardia: Secondary | ICD-10-CM | POA: Diagnosis not present

## 2017-07-02 DIAGNOSIS — I1311 Hypertensive heart and chronic kidney disease without heart failure, with stage 5 chronic kidney disease, or end stage renal disease: Secondary | ICD-10-CM | POA: Diagnosis not present

## 2017-07-05 DIAGNOSIS — I471 Supraventricular tachycardia: Secondary | ICD-10-CM | POA: Diagnosis not present

## 2017-07-05 DIAGNOSIS — I2510 Atherosclerotic heart disease of native coronary artery without angina pectoris: Secondary | ICD-10-CM | POA: Diagnosis not present

## 2017-07-05 DIAGNOSIS — I480 Paroxysmal atrial fibrillation: Secondary | ICD-10-CM | POA: Diagnosis not present

## 2017-07-05 DIAGNOSIS — N186 End stage renal disease: Secondary | ICD-10-CM | POA: Diagnosis not present

## 2017-07-05 DIAGNOSIS — D631 Anemia in chronic kidney disease: Secondary | ICD-10-CM | POA: Diagnosis not present

## 2017-07-05 DIAGNOSIS — I1311 Hypertensive heart and chronic kidney disease without heart failure, with stage 5 chronic kidney disease, or end stage renal disease: Secondary | ICD-10-CM | POA: Diagnosis not present

## 2017-07-06 DIAGNOSIS — I480 Paroxysmal atrial fibrillation: Secondary | ICD-10-CM | POA: Diagnosis not present

## 2017-07-06 DIAGNOSIS — D631 Anemia in chronic kidney disease: Secondary | ICD-10-CM | POA: Diagnosis not present

## 2017-07-06 DIAGNOSIS — I471 Supraventricular tachycardia: Secondary | ICD-10-CM | POA: Diagnosis not present

## 2017-07-06 DIAGNOSIS — I2510 Atherosclerotic heart disease of native coronary artery without angina pectoris: Secondary | ICD-10-CM | POA: Diagnosis not present

## 2017-07-06 DIAGNOSIS — N186 End stage renal disease: Secondary | ICD-10-CM | POA: Diagnosis not present

## 2017-07-06 DIAGNOSIS — I1311 Hypertensive heart and chronic kidney disease without heart failure, with stage 5 chronic kidney disease, or end stage renal disease: Secondary | ICD-10-CM | POA: Diagnosis not present

## 2017-07-07 DIAGNOSIS — I2510 Atherosclerotic heart disease of native coronary artery without angina pectoris: Secondary | ICD-10-CM | POA: Diagnosis not present

## 2017-07-07 DIAGNOSIS — I1311 Hypertensive heart and chronic kidney disease without heart failure, with stage 5 chronic kidney disease, or end stage renal disease: Secondary | ICD-10-CM | POA: Diagnosis not present

## 2017-07-07 DIAGNOSIS — D631 Anemia in chronic kidney disease: Secondary | ICD-10-CM | POA: Diagnosis not present

## 2017-07-07 DIAGNOSIS — N186 End stage renal disease: Secondary | ICD-10-CM | POA: Diagnosis not present

## 2017-07-07 DIAGNOSIS — I480 Paroxysmal atrial fibrillation: Secondary | ICD-10-CM | POA: Diagnosis not present

## 2017-07-07 DIAGNOSIS — I471 Supraventricular tachycardia: Secondary | ICD-10-CM | POA: Diagnosis not present

## 2017-07-08 DIAGNOSIS — N186 End stage renal disease: Secondary | ICD-10-CM | POA: Diagnosis not present

## 2017-07-08 DIAGNOSIS — I480 Paroxysmal atrial fibrillation: Secondary | ICD-10-CM | POA: Diagnosis not present

## 2017-07-08 DIAGNOSIS — I2510 Atherosclerotic heart disease of native coronary artery without angina pectoris: Secondary | ICD-10-CM | POA: Diagnosis not present

## 2017-07-08 DIAGNOSIS — I471 Supraventricular tachycardia: Secondary | ICD-10-CM | POA: Diagnosis not present

## 2017-07-08 DIAGNOSIS — I1311 Hypertensive heart and chronic kidney disease without heart failure, with stage 5 chronic kidney disease, or end stage renal disease: Secondary | ICD-10-CM | POA: Diagnosis not present

## 2017-07-08 DIAGNOSIS — D631 Anemia in chronic kidney disease: Secondary | ICD-10-CM | POA: Diagnosis not present

## 2017-07-09 DIAGNOSIS — I480 Paroxysmal atrial fibrillation: Secondary | ICD-10-CM | POA: Diagnosis not present

## 2017-07-09 DIAGNOSIS — N186 End stage renal disease: Secondary | ICD-10-CM | POA: Diagnosis not present

## 2017-07-09 DIAGNOSIS — I2510 Atherosclerotic heart disease of native coronary artery without angina pectoris: Secondary | ICD-10-CM | POA: Diagnosis not present

## 2017-07-09 DIAGNOSIS — I1311 Hypertensive heart and chronic kidney disease without heart failure, with stage 5 chronic kidney disease, or end stage renal disease: Secondary | ICD-10-CM | POA: Diagnosis not present

## 2017-07-09 DIAGNOSIS — I471 Supraventricular tachycardia: Secondary | ICD-10-CM | POA: Diagnosis not present

## 2017-07-09 DIAGNOSIS — D631 Anemia in chronic kidney disease: Secondary | ICD-10-CM | POA: Diagnosis not present

## 2017-07-12 DIAGNOSIS — I480 Paroxysmal atrial fibrillation: Secondary | ICD-10-CM | POA: Diagnosis not present

## 2017-07-12 DIAGNOSIS — D631 Anemia in chronic kidney disease: Secondary | ICD-10-CM | POA: Diagnosis not present

## 2017-07-12 DIAGNOSIS — I471 Supraventricular tachycardia: Secondary | ICD-10-CM | POA: Diagnosis not present

## 2017-07-12 DIAGNOSIS — I1311 Hypertensive heart and chronic kidney disease without heart failure, with stage 5 chronic kidney disease, or end stage renal disease: Secondary | ICD-10-CM | POA: Diagnosis not present

## 2017-07-12 DIAGNOSIS — I2510 Atherosclerotic heart disease of native coronary artery without angina pectoris: Secondary | ICD-10-CM | POA: Diagnosis not present

## 2017-07-12 DIAGNOSIS — N186 End stage renal disease: Secondary | ICD-10-CM | POA: Diagnosis not present

## 2017-07-13 DIAGNOSIS — W1789XA Other fall from one level to another, initial encounter: Secondary | ICD-10-CM | POA: Diagnosis not present

## 2017-07-13 DIAGNOSIS — I480 Paroxysmal atrial fibrillation: Secondary | ICD-10-CM | POA: Diagnosis not present

## 2017-07-13 DIAGNOSIS — D631 Anemia in chronic kidney disease: Secondary | ICD-10-CM | POA: Diagnosis not present

## 2017-07-13 DIAGNOSIS — I471 Supraventricular tachycardia: Secondary | ICD-10-CM | POA: Diagnosis not present

## 2017-07-13 DIAGNOSIS — N186 End stage renal disease: Secondary | ICD-10-CM | POA: Diagnosis not present

## 2017-07-13 DIAGNOSIS — I2510 Atherosclerotic heart disease of native coronary artery without angina pectoris: Secondary | ICD-10-CM | POA: Diagnosis not present

## 2017-07-13 DIAGNOSIS — R5381 Other malaise: Secondary | ICD-10-CM | POA: Diagnosis not present

## 2017-07-13 DIAGNOSIS — I1311 Hypertensive heart and chronic kidney disease without heart failure, with stage 5 chronic kidney disease, or end stage renal disease: Secondary | ICD-10-CM | POA: Diagnosis not present

## 2017-07-14 DIAGNOSIS — I1311 Hypertensive heart and chronic kidney disease without heart failure, with stage 5 chronic kidney disease, or end stage renal disease: Secondary | ICD-10-CM | POA: Diagnosis not present

## 2017-07-14 DIAGNOSIS — I2510 Atherosclerotic heart disease of native coronary artery without angina pectoris: Secondary | ICD-10-CM | POA: Diagnosis not present

## 2017-07-14 DIAGNOSIS — D631 Anemia in chronic kidney disease: Secondary | ICD-10-CM | POA: Diagnosis not present

## 2017-07-14 DIAGNOSIS — I480 Paroxysmal atrial fibrillation: Secondary | ICD-10-CM | POA: Diagnosis not present

## 2017-07-14 DIAGNOSIS — I471 Supraventricular tachycardia: Secondary | ICD-10-CM | POA: Diagnosis not present

## 2017-07-14 DIAGNOSIS — N186 End stage renal disease: Secondary | ICD-10-CM | POA: Diagnosis not present

## 2017-07-15 DIAGNOSIS — I2510 Atherosclerotic heart disease of native coronary artery without angina pectoris: Secondary | ICD-10-CM | POA: Diagnosis not present

## 2017-07-15 DIAGNOSIS — N186 End stage renal disease: Secondary | ICD-10-CM | POA: Diagnosis not present

## 2017-07-15 DIAGNOSIS — D631 Anemia in chronic kidney disease: Secondary | ICD-10-CM | POA: Diagnosis not present

## 2017-07-15 DIAGNOSIS — S52531A Colles' fracture of right radius, initial encounter for closed fracture: Secondary | ICD-10-CM | POA: Diagnosis not present

## 2017-07-15 DIAGNOSIS — I471 Supraventricular tachycardia: Secondary | ICD-10-CM | POA: Diagnosis not present

## 2017-07-15 DIAGNOSIS — I1311 Hypertensive heart and chronic kidney disease without heart failure, with stage 5 chronic kidney disease, or end stage renal disease: Secondary | ICD-10-CM | POA: Diagnosis not present

## 2017-07-15 DIAGNOSIS — I480 Paroxysmal atrial fibrillation: Secondary | ICD-10-CM | POA: Diagnosis not present

## 2017-07-16 DIAGNOSIS — I1311 Hypertensive heart and chronic kidney disease without heart failure, with stage 5 chronic kidney disease, or end stage renal disease: Secondary | ICD-10-CM | POA: Diagnosis not present

## 2017-07-16 DIAGNOSIS — I2510 Atherosclerotic heart disease of native coronary artery without angina pectoris: Secondary | ICD-10-CM | POA: Diagnosis not present

## 2017-07-16 DIAGNOSIS — I471 Supraventricular tachycardia: Secondary | ICD-10-CM | POA: Diagnosis not present

## 2017-07-16 DIAGNOSIS — D631 Anemia in chronic kidney disease: Secondary | ICD-10-CM | POA: Diagnosis not present

## 2017-07-16 DIAGNOSIS — N186 End stage renal disease: Secondary | ICD-10-CM | POA: Diagnosis not present

## 2017-07-16 DIAGNOSIS — I480 Paroxysmal atrial fibrillation: Secondary | ICD-10-CM | POA: Diagnosis not present

## 2017-07-19 DIAGNOSIS — N186 End stage renal disease: Secondary | ICD-10-CM | POA: Diagnosis not present

## 2017-07-19 DIAGNOSIS — I480 Paroxysmal atrial fibrillation: Secondary | ICD-10-CM | POA: Diagnosis not present

## 2017-07-19 DIAGNOSIS — I2510 Atherosclerotic heart disease of native coronary artery without angina pectoris: Secondary | ICD-10-CM | POA: Diagnosis not present

## 2017-07-19 DIAGNOSIS — I1311 Hypertensive heart and chronic kidney disease without heart failure, with stage 5 chronic kidney disease, or end stage renal disease: Secondary | ICD-10-CM | POA: Diagnosis not present

## 2017-07-19 DIAGNOSIS — I471 Supraventricular tachycardia: Secondary | ICD-10-CM | POA: Diagnosis not present

## 2017-07-19 DIAGNOSIS — D631 Anemia in chronic kidney disease: Secondary | ICD-10-CM | POA: Diagnosis not present

## 2017-07-20 DIAGNOSIS — D631 Anemia in chronic kidney disease: Secondary | ICD-10-CM | POA: Diagnosis not present

## 2017-07-20 DIAGNOSIS — I471 Supraventricular tachycardia: Secondary | ICD-10-CM | POA: Diagnosis not present

## 2017-07-20 DIAGNOSIS — I1311 Hypertensive heart and chronic kidney disease without heart failure, with stage 5 chronic kidney disease, or end stage renal disease: Secondary | ICD-10-CM | POA: Diagnosis not present

## 2017-07-20 DIAGNOSIS — N186 End stage renal disease: Secondary | ICD-10-CM | POA: Diagnosis not present

## 2017-07-20 DIAGNOSIS — I480 Paroxysmal atrial fibrillation: Secondary | ICD-10-CM | POA: Diagnosis not present

## 2017-07-20 DIAGNOSIS — I2510 Atherosclerotic heart disease of native coronary artery without angina pectoris: Secondary | ICD-10-CM | POA: Diagnosis not present

## 2017-07-21 DIAGNOSIS — N186 End stage renal disease: Secondary | ICD-10-CM | POA: Diagnosis not present

## 2017-07-21 DIAGNOSIS — I480 Paroxysmal atrial fibrillation: Secondary | ICD-10-CM | POA: Diagnosis not present

## 2017-07-21 DIAGNOSIS — I471 Supraventricular tachycardia: Secondary | ICD-10-CM | POA: Diagnosis not present

## 2017-07-21 DIAGNOSIS — I2510 Atherosclerotic heart disease of native coronary artery without angina pectoris: Secondary | ICD-10-CM | POA: Diagnosis not present

## 2017-07-21 DIAGNOSIS — I1311 Hypertensive heart and chronic kidney disease without heart failure, with stage 5 chronic kidney disease, or end stage renal disease: Secondary | ICD-10-CM | POA: Diagnosis not present

## 2017-07-21 DIAGNOSIS — D631 Anemia in chronic kidney disease: Secondary | ICD-10-CM | POA: Diagnosis not present

## 2017-07-21 IMAGING — US US RENAL
1 series · 14 of 25 positions shown · non-contrast
Comparison: None.

CLINICAL DATA: Acute renal failure.

EXAM:
RENAL / URINARY TRACT ULTRASOUND COMPLETE

[Series 1: us renal · 0.22mm/px · 14 of 57 slices shown]
[im 1/57]
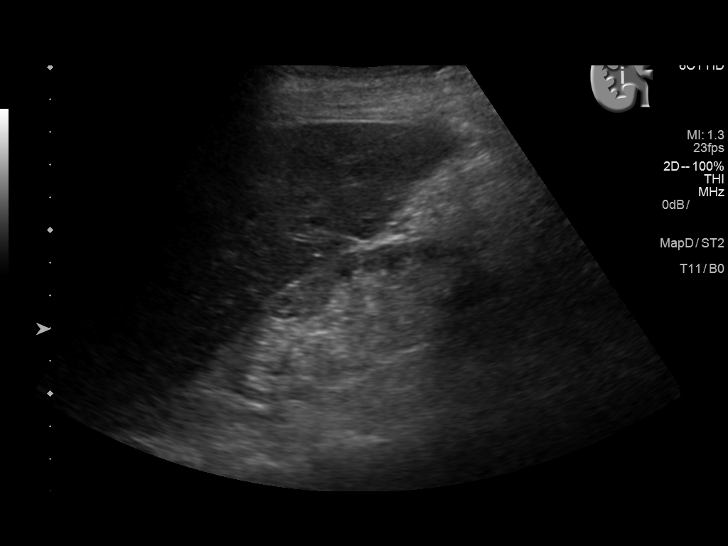
[im 5/57]
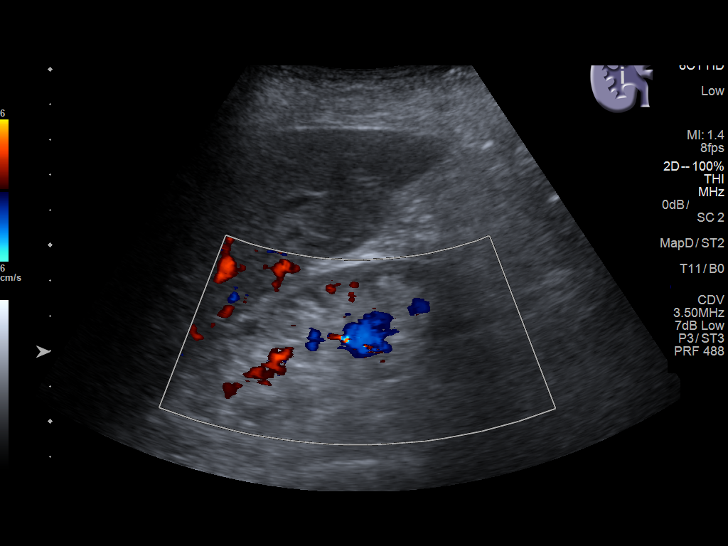
[im 10/57]
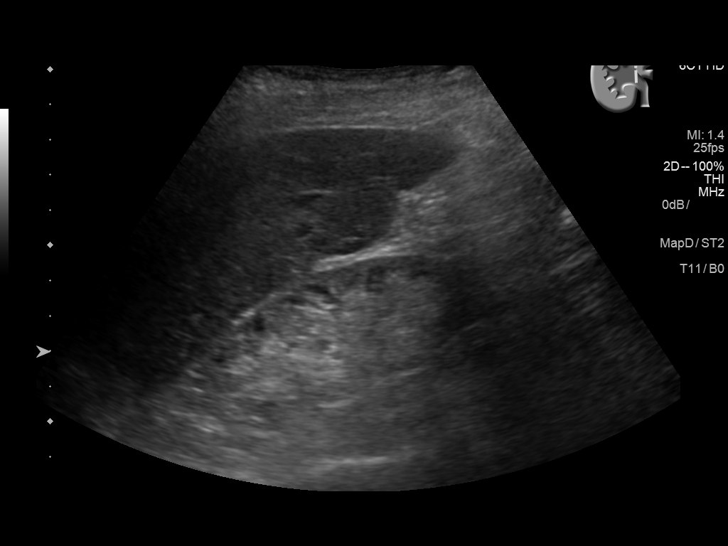
[im 15/57]
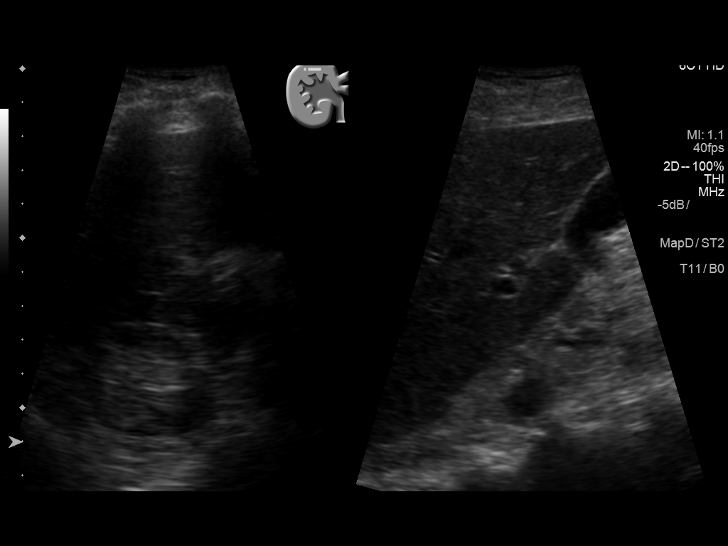
[im 19/57]
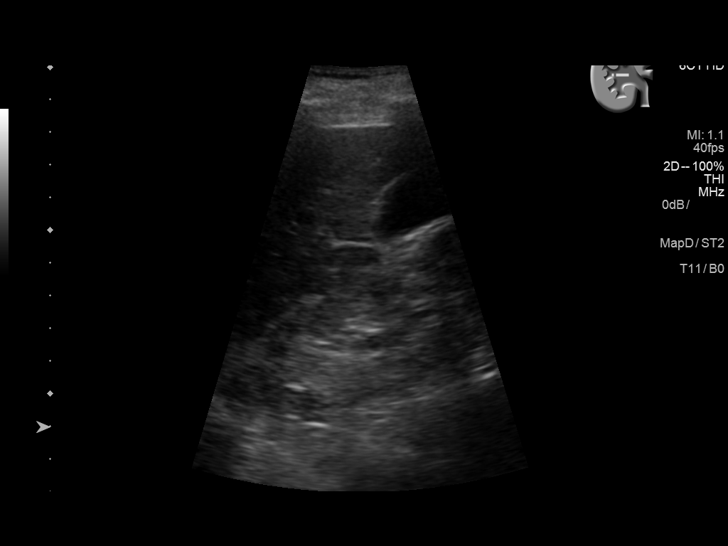
[im 22/57]
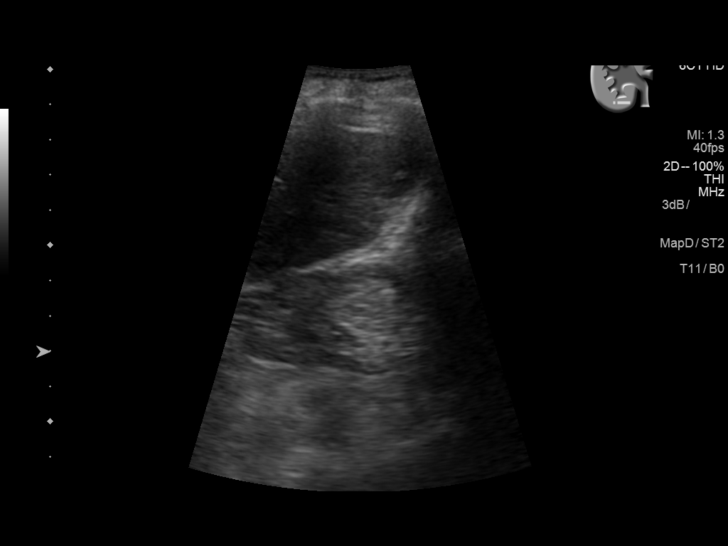
[im 26/57]
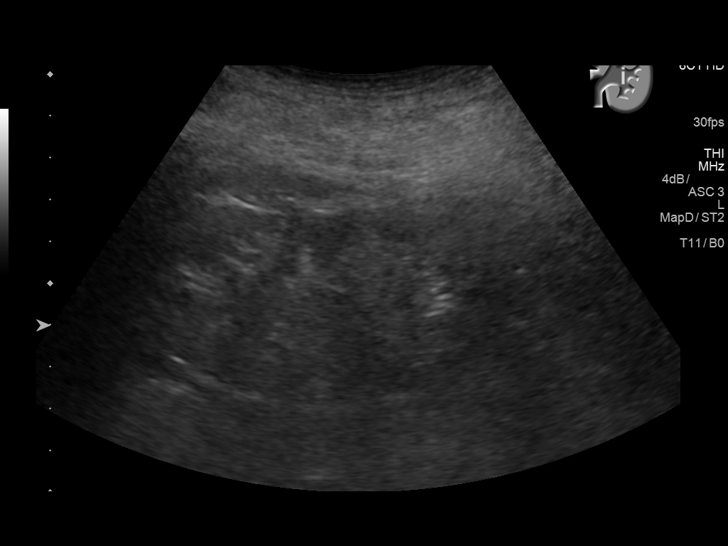
[im 31/57]
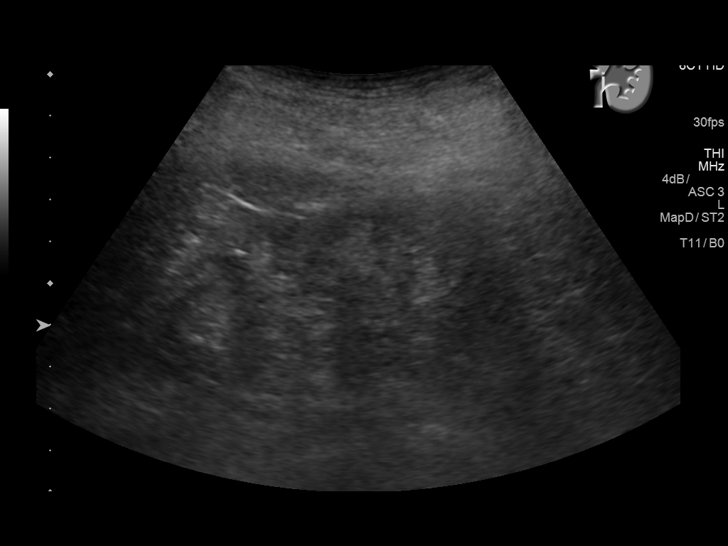
[im 36/57]
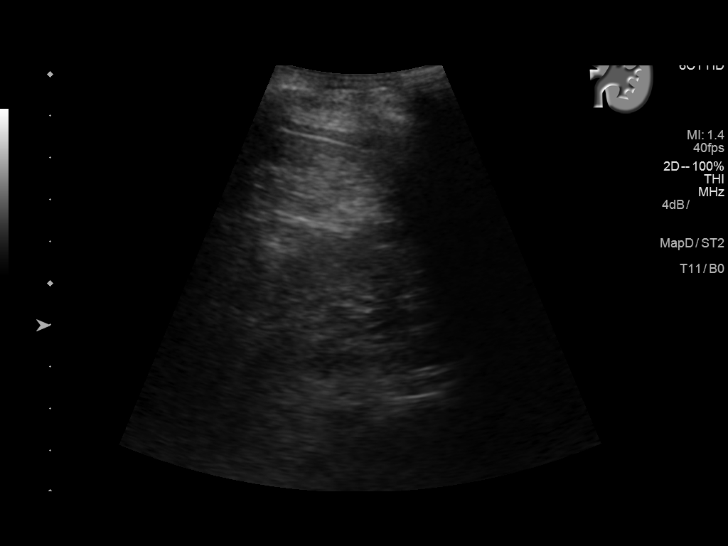
[im 38/57]
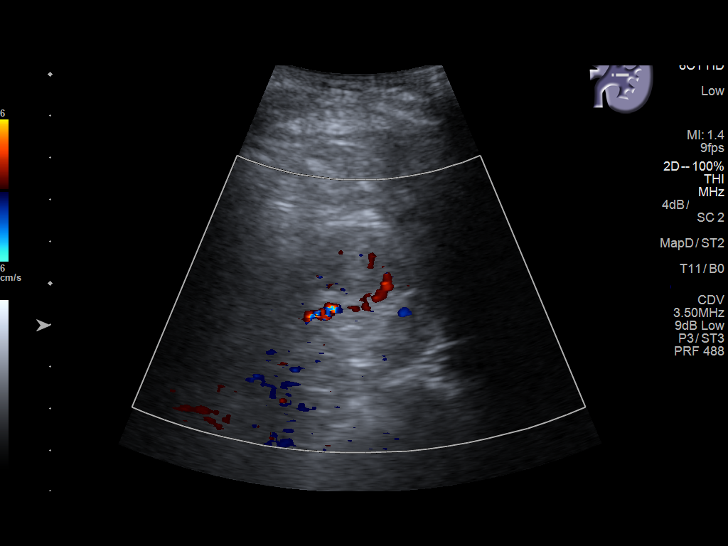
[im 43/57]
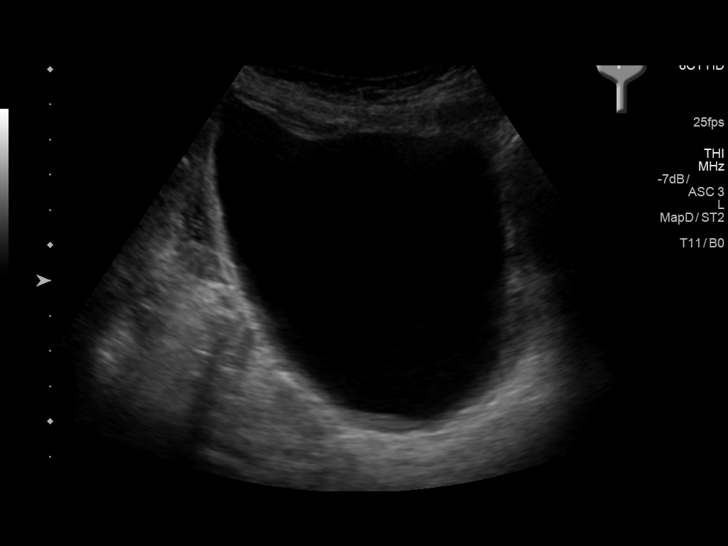
[im 47/57]
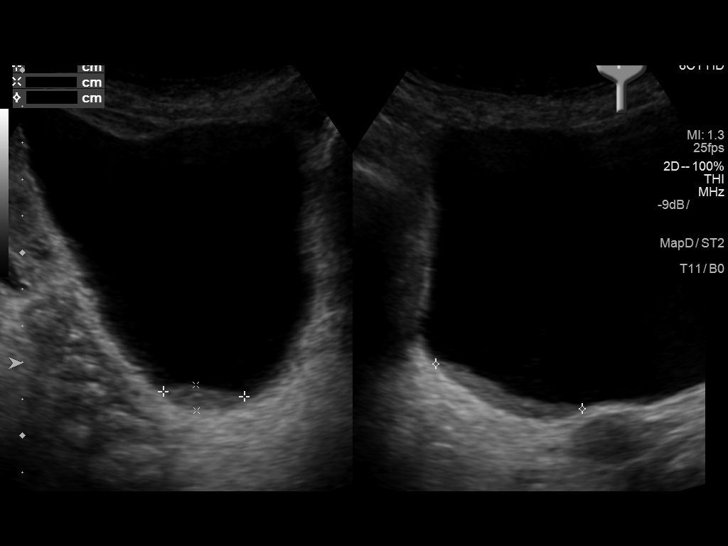
[im 52/57]
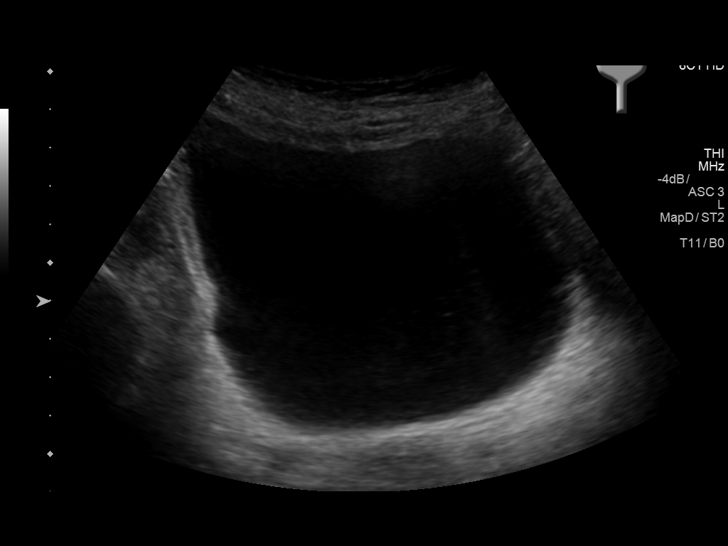
[im 57/57]
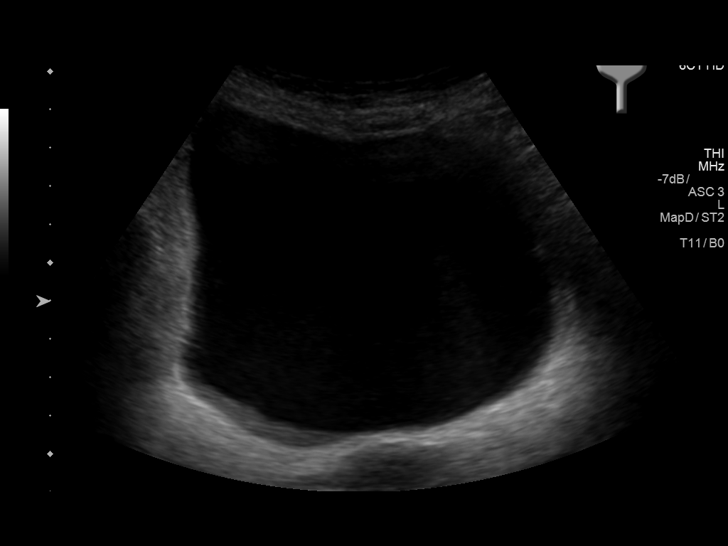

[14 of 25 positions shown; findings below may reference images not displayed]

FINDINGS: Right Kidney:

Length: 9.0 cm. Marked diffuse cortical thinning with prominent
renal sinus fat. The renal cortex is mildly echogenic. 1.4 cm upper
pole cyst. No hydronephrosis.

Left Kidney:

Length: 8.3 cm. Moderate diffuse cortical thinning with prominent
renal sinus fat. The cortex is mildly echogenic. No hydronephrosis.

Bladder:

Mild dependent debris.  Otherwise, unremarkable.
IMPRESSION: 1. Diffuse bilateral renal cortical atrophy.
2. Bilateral renal cortex increased echogenicity compatible with
medical renal disease.
3. No hydronephrosis.
4. Small amount of debris in the bladder.

## 2017-07-22 DIAGNOSIS — I471 Supraventricular tachycardia: Secondary | ICD-10-CM | POA: Diagnosis not present

## 2017-07-22 DIAGNOSIS — D631 Anemia in chronic kidney disease: Secondary | ICD-10-CM | POA: Diagnosis not present

## 2017-07-22 DIAGNOSIS — I1311 Hypertensive heart and chronic kidney disease without heart failure, with stage 5 chronic kidney disease, or end stage renal disease: Secondary | ICD-10-CM | POA: Diagnosis not present

## 2017-07-22 DIAGNOSIS — I2510 Atherosclerotic heart disease of native coronary artery without angina pectoris: Secondary | ICD-10-CM | POA: Diagnosis not present

## 2017-07-22 DIAGNOSIS — I480 Paroxysmal atrial fibrillation: Secondary | ICD-10-CM | POA: Diagnosis not present

## 2017-07-22 DIAGNOSIS — N186 End stage renal disease: Secondary | ICD-10-CM | POA: Diagnosis not present

## 2017-07-23 DIAGNOSIS — I1311 Hypertensive heart and chronic kidney disease without heart failure, with stage 5 chronic kidney disease, or end stage renal disease: Secondary | ICD-10-CM | POA: Diagnosis not present

## 2017-07-23 DIAGNOSIS — Z9981 Dependence on supplemental oxygen: Secondary | ICD-10-CM | POA: Diagnosis not present

## 2017-07-23 DIAGNOSIS — I2510 Atherosclerotic heart disease of native coronary artery without angina pectoris: Secondary | ICD-10-CM | POA: Diagnosis not present

## 2017-07-23 DIAGNOSIS — M109 Gout, unspecified: Secondary | ICD-10-CM | POA: Diagnosis not present

## 2017-07-23 DIAGNOSIS — R627 Adult failure to thrive: Secondary | ICD-10-CM | POA: Diagnosis not present

## 2017-07-23 DIAGNOSIS — Z7982 Long term (current) use of aspirin: Secondary | ICD-10-CM | POA: Diagnosis not present

## 2017-07-23 DIAGNOSIS — Z6820 Body mass index (BMI) 20.0-20.9, adult: Secondary | ICD-10-CM | POA: Diagnosis not present

## 2017-07-23 DIAGNOSIS — D631 Anemia in chronic kidney disease: Secondary | ICD-10-CM | POA: Diagnosis not present

## 2017-07-23 DIAGNOSIS — I471 Supraventricular tachycardia: Secondary | ICD-10-CM | POA: Diagnosis not present

## 2017-07-23 DIAGNOSIS — E785 Hyperlipidemia, unspecified: Secondary | ICD-10-CM | POA: Diagnosis not present

## 2017-07-23 DIAGNOSIS — I480 Paroxysmal atrial fibrillation: Secondary | ICD-10-CM | POA: Diagnosis not present

## 2017-07-23 DIAGNOSIS — S52501D Unspecified fracture of the lower end of right radius, subsequent encounter for closed fracture with routine healing: Secondary | ICD-10-CM | POA: Diagnosis not present

## 2017-07-23 DIAGNOSIS — F329 Major depressive disorder, single episode, unspecified: Secondary | ICD-10-CM | POA: Diagnosis not present

## 2017-07-23 DIAGNOSIS — Z9181 History of falling: Secondary | ICD-10-CM | POA: Diagnosis not present

## 2017-07-23 DIAGNOSIS — E559 Vitamin D deficiency, unspecified: Secondary | ICD-10-CM | POA: Diagnosis not present

## 2017-07-23 DIAGNOSIS — I340 Nonrheumatic mitral (valve) insufficiency: Secondary | ICD-10-CM | POA: Diagnosis not present

## 2017-07-23 DIAGNOSIS — E039 Hypothyroidism, unspecified: Secondary | ICD-10-CM | POA: Diagnosis not present

## 2017-07-23 DIAGNOSIS — I361 Nonrheumatic tricuspid (valve) insufficiency: Secondary | ICD-10-CM | POA: Diagnosis not present

## 2017-07-23 DIAGNOSIS — N186 End stage renal disease: Secondary | ICD-10-CM | POA: Diagnosis not present

## 2017-07-26 DIAGNOSIS — I471 Supraventricular tachycardia: Secondary | ICD-10-CM | POA: Diagnosis not present

## 2017-07-26 DIAGNOSIS — D631 Anemia in chronic kidney disease: Secondary | ICD-10-CM | POA: Diagnosis not present

## 2017-07-26 DIAGNOSIS — I1311 Hypertensive heart and chronic kidney disease without heart failure, with stage 5 chronic kidney disease, or end stage renal disease: Secondary | ICD-10-CM | POA: Diagnosis not present

## 2017-07-26 DIAGNOSIS — I2510 Atherosclerotic heart disease of native coronary artery without angina pectoris: Secondary | ICD-10-CM | POA: Diagnosis not present

## 2017-07-26 DIAGNOSIS — N186 End stage renal disease: Secondary | ICD-10-CM | POA: Diagnosis not present

## 2017-07-26 DIAGNOSIS — I480 Paroxysmal atrial fibrillation: Secondary | ICD-10-CM | POA: Diagnosis not present

## 2017-07-27 DIAGNOSIS — D631 Anemia in chronic kidney disease: Secondary | ICD-10-CM | POA: Diagnosis not present

## 2017-07-27 DIAGNOSIS — I1311 Hypertensive heart and chronic kidney disease without heart failure, with stage 5 chronic kidney disease, or end stage renal disease: Secondary | ICD-10-CM | POA: Diagnosis not present

## 2017-07-27 DIAGNOSIS — E119 Type 2 diabetes mellitus without complications: Secondary | ICD-10-CM | POA: Diagnosis not present

## 2017-07-27 DIAGNOSIS — I480 Paroxysmal atrial fibrillation: Secondary | ICD-10-CM | POA: Diagnosis not present

## 2017-07-27 DIAGNOSIS — N186 End stage renal disease: Secondary | ICD-10-CM | POA: Diagnosis not present

## 2017-07-27 DIAGNOSIS — I2510 Atherosclerotic heart disease of native coronary artery without angina pectoris: Secondary | ICD-10-CM | POA: Diagnosis not present

## 2017-07-27 DIAGNOSIS — I10 Essential (primary) hypertension: Secondary | ICD-10-CM | POA: Diagnosis not present

## 2017-07-27 DIAGNOSIS — N184 Chronic kidney disease, stage 4 (severe): Secondary | ICD-10-CM | POA: Diagnosis not present

## 2017-07-27 DIAGNOSIS — I471 Supraventricular tachycardia: Secondary | ICD-10-CM | POA: Diagnosis not present

## 2017-07-28 DIAGNOSIS — I2510 Atherosclerotic heart disease of native coronary artery without angina pectoris: Secondary | ICD-10-CM | POA: Diagnosis not present

## 2017-07-28 DIAGNOSIS — I471 Supraventricular tachycardia: Secondary | ICD-10-CM | POA: Diagnosis not present

## 2017-07-28 DIAGNOSIS — N186 End stage renal disease: Secondary | ICD-10-CM | POA: Diagnosis not present

## 2017-07-28 DIAGNOSIS — D631 Anemia in chronic kidney disease: Secondary | ICD-10-CM | POA: Diagnosis not present

## 2017-07-28 DIAGNOSIS — I1311 Hypertensive heart and chronic kidney disease without heart failure, with stage 5 chronic kidney disease, or end stage renal disease: Secondary | ICD-10-CM | POA: Diagnosis not present

## 2017-07-28 DIAGNOSIS — I480 Paroxysmal atrial fibrillation: Secondary | ICD-10-CM | POA: Diagnosis not present

## 2017-07-29 DIAGNOSIS — I471 Supraventricular tachycardia: Secondary | ICD-10-CM | POA: Diagnosis not present

## 2017-07-29 DIAGNOSIS — N186 End stage renal disease: Secondary | ICD-10-CM | POA: Diagnosis not present

## 2017-07-29 DIAGNOSIS — I480 Paroxysmal atrial fibrillation: Secondary | ICD-10-CM | POA: Diagnosis not present

## 2017-07-29 DIAGNOSIS — D631 Anemia in chronic kidney disease: Secondary | ICD-10-CM | POA: Diagnosis not present

## 2017-07-29 DIAGNOSIS — I1311 Hypertensive heart and chronic kidney disease without heart failure, with stage 5 chronic kidney disease, or end stage renal disease: Secondary | ICD-10-CM | POA: Diagnosis not present

## 2017-07-29 DIAGNOSIS — I2510 Atherosclerotic heart disease of native coronary artery without angina pectoris: Secondary | ICD-10-CM | POA: Diagnosis not present

## 2017-07-30 DIAGNOSIS — I471 Supraventricular tachycardia: Secondary | ICD-10-CM | POA: Diagnosis not present

## 2017-07-30 DIAGNOSIS — I2510 Atherosclerotic heart disease of native coronary artery without angina pectoris: Secondary | ICD-10-CM | POA: Diagnosis not present

## 2017-07-30 DIAGNOSIS — I1311 Hypertensive heart and chronic kidney disease without heart failure, with stage 5 chronic kidney disease, or end stage renal disease: Secondary | ICD-10-CM | POA: Diagnosis not present

## 2017-07-30 DIAGNOSIS — D631 Anemia in chronic kidney disease: Secondary | ICD-10-CM | POA: Diagnosis not present

## 2017-07-30 DIAGNOSIS — I480 Paroxysmal atrial fibrillation: Secondary | ICD-10-CM | POA: Diagnosis not present

## 2017-07-30 DIAGNOSIS — N186 End stage renal disease: Secondary | ICD-10-CM | POA: Diagnosis not present

## 2017-08-02 DIAGNOSIS — N186 End stage renal disease: Secondary | ICD-10-CM | POA: Diagnosis not present

## 2017-08-02 DIAGNOSIS — I2510 Atherosclerotic heart disease of native coronary artery without angina pectoris: Secondary | ICD-10-CM | POA: Diagnosis not present

## 2017-08-02 DIAGNOSIS — I480 Paroxysmal atrial fibrillation: Secondary | ICD-10-CM | POA: Diagnosis not present

## 2017-08-02 DIAGNOSIS — I1311 Hypertensive heart and chronic kidney disease without heart failure, with stage 5 chronic kidney disease, or end stage renal disease: Secondary | ICD-10-CM | POA: Diagnosis not present

## 2017-08-02 DIAGNOSIS — D631 Anemia in chronic kidney disease: Secondary | ICD-10-CM | POA: Diagnosis not present

## 2017-08-02 DIAGNOSIS — I471 Supraventricular tachycardia: Secondary | ICD-10-CM | POA: Diagnosis not present

## 2017-08-03 DIAGNOSIS — I480 Paroxysmal atrial fibrillation: Secondary | ICD-10-CM | POA: Diagnosis not present

## 2017-08-03 DIAGNOSIS — D631 Anemia in chronic kidney disease: Secondary | ICD-10-CM | POA: Diagnosis not present

## 2017-08-03 DIAGNOSIS — I2510 Atherosclerotic heart disease of native coronary artery without angina pectoris: Secondary | ICD-10-CM | POA: Diagnosis not present

## 2017-08-03 DIAGNOSIS — I1311 Hypertensive heart and chronic kidney disease without heart failure, with stage 5 chronic kidney disease, or end stage renal disease: Secondary | ICD-10-CM | POA: Diagnosis not present

## 2017-08-03 DIAGNOSIS — I471 Supraventricular tachycardia: Secondary | ICD-10-CM | POA: Diagnosis not present

## 2017-08-03 DIAGNOSIS — N186 End stage renal disease: Secondary | ICD-10-CM | POA: Diagnosis not present

## 2017-08-04 DIAGNOSIS — N186 End stage renal disease: Secondary | ICD-10-CM | POA: Diagnosis not present

## 2017-08-04 DIAGNOSIS — I1311 Hypertensive heart and chronic kidney disease without heart failure, with stage 5 chronic kidney disease, or end stage renal disease: Secondary | ICD-10-CM | POA: Diagnosis not present

## 2017-08-04 DIAGNOSIS — I2510 Atherosclerotic heart disease of native coronary artery without angina pectoris: Secondary | ICD-10-CM | POA: Diagnosis not present

## 2017-08-04 DIAGNOSIS — I471 Supraventricular tachycardia: Secondary | ICD-10-CM | POA: Diagnosis not present

## 2017-08-04 DIAGNOSIS — S52531D Colles' fracture of right radius, subsequent encounter for closed fracture with routine healing: Secondary | ICD-10-CM | POA: Diagnosis not present

## 2017-08-04 DIAGNOSIS — I480 Paroxysmal atrial fibrillation: Secondary | ICD-10-CM | POA: Diagnosis not present

## 2017-08-04 DIAGNOSIS — D631 Anemia in chronic kidney disease: Secondary | ICD-10-CM | POA: Diagnosis not present

## 2017-08-05 DIAGNOSIS — N186 End stage renal disease: Secondary | ICD-10-CM | POA: Diagnosis not present

## 2017-08-05 DIAGNOSIS — I1311 Hypertensive heart and chronic kidney disease without heart failure, with stage 5 chronic kidney disease, or end stage renal disease: Secondary | ICD-10-CM | POA: Diagnosis not present

## 2017-08-05 DIAGNOSIS — I2510 Atherosclerotic heart disease of native coronary artery without angina pectoris: Secondary | ICD-10-CM | POA: Diagnosis not present

## 2017-08-05 DIAGNOSIS — I480 Paroxysmal atrial fibrillation: Secondary | ICD-10-CM | POA: Diagnosis not present

## 2017-08-05 DIAGNOSIS — D631 Anemia in chronic kidney disease: Secondary | ICD-10-CM | POA: Diagnosis not present

## 2017-08-05 DIAGNOSIS — I471 Supraventricular tachycardia: Secondary | ICD-10-CM | POA: Diagnosis not present

## 2017-08-06 DIAGNOSIS — I2510 Atherosclerotic heart disease of native coronary artery without angina pectoris: Secondary | ICD-10-CM | POA: Diagnosis not present

## 2017-08-06 DIAGNOSIS — I471 Supraventricular tachycardia: Secondary | ICD-10-CM | POA: Diagnosis not present

## 2017-08-06 DIAGNOSIS — I1311 Hypertensive heart and chronic kidney disease without heart failure, with stage 5 chronic kidney disease, or end stage renal disease: Secondary | ICD-10-CM | POA: Diagnosis not present

## 2017-08-06 DIAGNOSIS — I480 Paroxysmal atrial fibrillation: Secondary | ICD-10-CM | POA: Diagnosis not present

## 2017-08-06 DIAGNOSIS — D631 Anemia in chronic kidney disease: Secondary | ICD-10-CM | POA: Diagnosis not present

## 2017-08-06 DIAGNOSIS — N186 End stage renal disease: Secondary | ICD-10-CM | POA: Diagnosis not present

## 2017-08-09 DIAGNOSIS — I480 Paroxysmal atrial fibrillation: Secondary | ICD-10-CM | POA: Diagnosis not present

## 2017-08-09 DIAGNOSIS — D631 Anemia in chronic kidney disease: Secondary | ICD-10-CM | POA: Diagnosis not present

## 2017-08-09 DIAGNOSIS — I471 Supraventricular tachycardia: Secondary | ICD-10-CM | POA: Diagnosis not present

## 2017-08-09 DIAGNOSIS — I2510 Atherosclerotic heart disease of native coronary artery without angina pectoris: Secondary | ICD-10-CM | POA: Diagnosis not present

## 2017-08-09 DIAGNOSIS — I1311 Hypertensive heart and chronic kidney disease without heart failure, with stage 5 chronic kidney disease, or end stage renal disease: Secondary | ICD-10-CM | POA: Diagnosis not present

## 2017-08-09 DIAGNOSIS — N186 End stage renal disease: Secondary | ICD-10-CM | POA: Diagnosis not present

## 2017-08-10 DIAGNOSIS — I471 Supraventricular tachycardia: Secondary | ICD-10-CM | POA: Diagnosis not present

## 2017-08-10 DIAGNOSIS — I2510 Atherosclerotic heart disease of native coronary artery without angina pectoris: Secondary | ICD-10-CM | POA: Diagnosis not present

## 2017-08-10 DIAGNOSIS — I480 Paroxysmal atrial fibrillation: Secondary | ICD-10-CM | POA: Diagnosis not present

## 2017-08-10 DIAGNOSIS — D631 Anemia in chronic kidney disease: Secondary | ICD-10-CM | POA: Diagnosis not present

## 2017-08-10 DIAGNOSIS — N186 End stage renal disease: Secondary | ICD-10-CM | POA: Diagnosis not present

## 2017-08-10 DIAGNOSIS — I1311 Hypertensive heart and chronic kidney disease without heart failure, with stage 5 chronic kidney disease, or end stage renal disease: Secondary | ICD-10-CM | POA: Diagnosis not present

## 2017-08-11 DIAGNOSIS — I1311 Hypertensive heart and chronic kidney disease without heart failure, with stage 5 chronic kidney disease, or end stage renal disease: Secondary | ICD-10-CM | POA: Diagnosis not present

## 2017-08-11 DIAGNOSIS — D631 Anemia in chronic kidney disease: Secondary | ICD-10-CM | POA: Diagnosis not present

## 2017-08-11 DIAGNOSIS — I480 Paroxysmal atrial fibrillation: Secondary | ICD-10-CM | POA: Diagnosis not present

## 2017-08-11 DIAGNOSIS — N186 End stage renal disease: Secondary | ICD-10-CM | POA: Diagnosis not present

## 2017-08-11 DIAGNOSIS — I2510 Atherosclerotic heart disease of native coronary artery without angina pectoris: Secondary | ICD-10-CM | POA: Diagnosis not present

## 2017-08-11 DIAGNOSIS — I471 Supraventricular tachycardia: Secondary | ICD-10-CM | POA: Diagnosis not present

## 2017-08-12 DIAGNOSIS — I2510 Atherosclerotic heart disease of native coronary artery without angina pectoris: Secondary | ICD-10-CM | POA: Diagnosis not present

## 2017-08-12 DIAGNOSIS — I480 Paroxysmal atrial fibrillation: Secondary | ICD-10-CM | POA: Diagnosis not present

## 2017-08-12 DIAGNOSIS — I1311 Hypertensive heart and chronic kidney disease without heart failure, with stage 5 chronic kidney disease, or end stage renal disease: Secondary | ICD-10-CM | POA: Diagnosis not present

## 2017-08-12 DIAGNOSIS — N186 End stage renal disease: Secondary | ICD-10-CM | POA: Diagnosis not present

## 2017-08-12 DIAGNOSIS — I471 Supraventricular tachycardia: Secondary | ICD-10-CM | POA: Diagnosis not present

## 2017-08-12 DIAGNOSIS — D631 Anemia in chronic kidney disease: Secondary | ICD-10-CM | POA: Diagnosis not present

## 2017-08-13 DIAGNOSIS — I480 Paroxysmal atrial fibrillation: Secondary | ICD-10-CM | POA: Diagnosis not present

## 2017-08-13 DIAGNOSIS — I1311 Hypertensive heart and chronic kidney disease without heart failure, with stage 5 chronic kidney disease, or end stage renal disease: Secondary | ICD-10-CM | POA: Diagnosis not present

## 2017-08-13 DIAGNOSIS — N186 End stage renal disease: Secondary | ICD-10-CM | POA: Diagnosis not present

## 2017-08-13 DIAGNOSIS — I2510 Atherosclerotic heart disease of native coronary artery without angina pectoris: Secondary | ICD-10-CM | POA: Diagnosis not present

## 2017-08-13 DIAGNOSIS — I471 Supraventricular tachycardia: Secondary | ICD-10-CM | POA: Diagnosis not present

## 2017-08-13 DIAGNOSIS — D631 Anemia in chronic kidney disease: Secondary | ICD-10-CM | POA: Diagnosis not present

## 2017-08-16 DIAGNOSIS — D631 Anemia in chronic kidney disease: Secondary | ICD-10-CM | POA: Diagnosis not present

## 2017-08-16 DIAGNOSIS — I480 Paroxysmal atrial fibrillation: Secondary | ICD-10-CM | POA: Diagnosis not present

## 2017-08-16 DIAGNOSIS — I471 Supraventricular tachycardia: Secondary | ICD-10-CM | POA: Diagnosis not present

## 2017-08-16 DIAGNOSIS — I2510 Atherosclerotic heart disease of native coronary artery without angina pectoris: Secondary | ICD-10-CM | POA: Diagnosis not present

## 2017-08-16 DIAGNOSIS — I1311 Hypertensive heart and chronic kidney disease without heart failure, with stage 5 chronic kidney disease, or end stage renal disease: Secondary | ICD-10-CM | POA: Diagnosis not present

## 2017-08-16 DIAGNOSIS — N186 End stage renal disease: Secondary | ICD-10-CM | POA: Diagnosis not present

## 2017-08-17 DIAGNOSIS — I2510 Atherosclerotic heart disease of native coronary artery without angina pectoris: Secondary | ICD-10-CM | POA: Diagnosis not present

## 2017-08-17 DIAGNOSIS — I480 Paroxysmal atrial fibrillation: Secondary | ICD-10-CM | POA: Diagnosis not present

## 2017-08-17 DIAGNOSIS — I1311 Hypertensive heart and chronic kidney disease without heart failure, with stage 5 chronic kidney disease, or end stage renal disease: Secondary | ICD-10-CM | POA: Diagnosis not present

## 2017-08-17 DIAGNOSIS — D631 Anemia in chronic kidney disease: Secondary | ICD-10-CM | POA: Diagnosis not present

## 2017-08-17 DIAGNOSIS — I471 Supraventricular tachycardia: Secondary | ICD-10-CM | POA: Diagnosis not present

## 2017-08-17 DIAGNOSIS — N186 End stage renal disease: Secondary | ICD-10-CM | POA: Diagnosis not present

## 2017-08-18 DIAGNOSIS — I471 Supraventricular tachycardia: Secondary | ICD-10-CM | POA: Diagnosis not present

## 2017-08-18 DIAGNOSIS — N186 End stage renal disease: Secondary | ICD-10-CM | POA: Diagnosis not present

## 2017-08-18 DIAGNOSIS — I2510 Atherosclerotic heart disease of native coronary artery without angina pectoris: Secondary | ICD-10-CM | POA: Diagnosis not present

## 2017-08-18 DIAGNOSIS — I480 Paroxysmal atrial fibrillation: Secondary | ICD-10-CM | POA: Diagnosis not present

## 2017-08-18 DIAGNOSIS — D631 Anemia in chronic kidney disease: Secondary | ICD-10-CM | POA: Diagnosis not present

## 2017-08-18 DIAGNOSIS — I1311 Hypertensive heart and chronic kidney disease without heart failure, with stage 5 chronic kidney disease, or end stage renal disease: Secondary | ICD-10-CM | POA: Diagnosis not present

## 2017-08-19 DIAGNOSIS — I471 Supraventricular tachycardia: Secondary | ICD-10-CM | POA: Diagnosis not present

## 2017-08-19 DIAGNOSIS — I1311 Hypertensive heart and chronic kidney disease without heart failure, with stage 5 chronic kidney disease, or end stage renal disease: Secondary | ICD-10-CM | POA: Diagnosis not present

## 2017-08-19 DIAGNOSIS — I480 Paroxysmal atrial fibrillation: Secondary | ICD-10-CM | POA: Diagnosis not present

## 2017-08-19 DIAGNOSIS — D631 Anemia in chronic kidney disease: Secondary | ICD-10-CM | POA: Diagnosis not present

## 2017-08-19 DIAGNOSIS — N186 End stage renal disease: Secondary | ICD-10-CM | POA: Diagnosis not present

## 2017-08-19 DIAGNOSIS — I2510 Atherosclerotic heart disease of native coronary artery without angina pectoris: Secondary | ICD-10-CM | POA: Diagnosis not present

## 2017-08-20 DIAGNOSIS — I480 Paroxysmal atrial fibrillation: Secondary | ICD-10-CM | POA: Diagnosis not present

## 2017-08-20 DIAGNOSIS — D631 Anemia in chronic kidney disease: Secondary | ICD-10-CM | POA: Diagnosis not present

## 2017-08-20 DIAGNOSIS — Z9981 Dependence on supplemental oxygen: Secondary | ICD-10-CM | POA: Diagnosis not present

## 2017-08-20 DIAGNOSIS — E559 Vitamin D deficiency, unspecified: Secondary | ICD-10-CM | POA: Diagnosis not present

## 2017-08-20 DIAGNOSIS — M109 Gout, unspecified: Secondary | ICD-10-CM | POA: Diagnosis not present

## 2017-08-20 DIAGNOSIS — Z7982 Long term (current) use of aspirin: Secondary | ICD-10-CM | POA: Diagnosis not present

## 2017-08-20 DIAGNOSIS — R627 Adult failure to thrive: Secondary | ICD-10-CM | POA: Diagnosis not present

## 2017-08-20 DIAGNOSIS — I1311 Hypertensive heart and chronic kidney disease without heart failure, with stage 5 chronic kidney disease, or end stage renal disease: Secondary | ICD-10-CM | POA: Diagnosis not present

## 2017-08-20 DIAGNOSIS — Z9181 History of falling: Secondary | ICD-10-CM | POA: Diagnosis not present

## 2017-08-20 DIAGNOSIS — I2510 Atherosclerotic heart disease of native coronary artery without angina pectoris: Secondary | ICD-10-CM | POA: Diagnosis not present

## 2017-08-20 DIAGNOSIS — S52501D Unspecified fracture of the lower end of right radius, subsequent encounter for closed fracture with routine healing: Secondary | ICD-10-CM | POA: Diagnosis not present

## 2017-08-20 DIAGNOSIS — I471 Supraventricular tachycardia: Secondary | ICD-10-CM | POA: Diagnosis not present

## 2017-08-20 DIAGNOSIS — E785 Hyperlipidemia, unspecified: Secondary | ICD-10-CM | POA: Diagnosis not present

## 2017-08-20 DIAGNOSIS — I340 Nonrheumatic mitral (valve) insufficiency: Secondary | ICD-10-CM | POA: Diagnosis not present

## 2017-08-20 DIAGNOSIS — I361 Nonrheumatic tricuspid (valve) insufficiency: Secondary | ICD-10-CM | POA: Diagnosis not present

## 2017-08-20 DIAGNOSIS — F329 Major depressive disorder, single episode, unspecified: Secondary | ICD-10-CM | POA: Diagnosis not present

## 2017-08-20 DIAGNOSIS — N186 End stage renal disease: Secondary | ICD-10-CM | POA: Diagnosis not present

## 2017-08-20 DIAGNOSIS — E039 Hypothyroidism, unspecified: Secondary | ICD-10-CM | POA: Diagnosis not present

## 2017-08-20 DIAGNOSIS — Z6820 Body mass index (BMI) 20.0-20.9, adult: Secondary | ICD-10-CM | POA: Diagnosis not present

## 2017-08-23 DIAGNOSIS — I2510 Atherosclerotic heart disease of native coronary artery without angina pectoris: Secondary | ICD-10-CM | POA: Diagnosis not present

## 2017-08-23 DIAGNOSIS — D631 Anemia in chronic kidney disease: Secondary | ICD-10-CM | POA: Diagnosis not present

## 2017-08-23 DIAGNOSIS — I1311 Hypertensive heart and chronic kidney disease without heart failure, with stage 5 chronic kidney disease, or end stage renal disease: Secondary | ICD-10-CM | POA: Diagnosis not present

## 2017-08-23 DIAGNOSIS — I480 Paroxysmal atrial fibrillation: Secondary | ICD-10-CM | POA: Diagnosis not present

## 2017-08-23 DIAGNOSIS — N186 End stage renal disease: Secondary | ICD-10-CM | POA: Diagnosis not present

## 2017-08-23 DIAGNOSIS — I471 Supraventricular tachycardia: Secondary | ICD-10-CM | POA: Diagnosis not present

## 2017-08-24 DIAGNOSIS — R5381 Other malaise: Secondary | ICD-10-CM | POA: Diagnosis not present

## 2017-08-24 DIAGNOSIS — I1311 Hypertensive heart and chronic kidney disease without heart failure, with stage 5 chronic kidney disease, or end stage renal disease: Secondary | ICD-10-CM | POA: Diagnosis not present

## 2017-08-24 DIAGNOSIS — I471 Supraventricular tachycardia: Secondary | ICD-10-CM | POA: Diagnosis not present

## 2017-08-24 DIAGNOSIS — H5789 Other specified disorders of eye and adnexa: Secondary | ICD-10-CM | POA: Diagnosis not present

## 2017-08-24 DIAGNOSIS — N186 End stage renal disease: Secondary | ICD-10-CM | POA: Diagnosis not present

## 2017-08-24 DIAGNOSIS — I2510 Atherosclerotic heart disease of native coronary artery without angina pectoris: Secondary | ICD-10-CM | POA: Diagnosis not present

## 2017-08-24 DIAGNOSIS — L22 Diaper dermatitis: Secondary | ICD-10-CM | POA: Diagnosis not present

## 2017-08-24 DIAGNOSIS — I480 Paroxysmal atrial fibrillation: Secondary | ICD-10-CM | POA: Diagnosis not present

## 2017-08-24 DIAGNOSIS — D631 Anemia in chronic kidney disease: Secondary | ICD-10-CM | POA: Diagnosis not present

## 2017-08-25 DIAGNOSIS — I1311 Hypertensive heart and chronic kidney disease without heart failure, with stage 5 chronic kidney disease, or end stage renal disease: Secondary | ICD-10-CM | POA: Diagnosis not present

## 2017-08-25 DIAGNOSIS — N186 End stage renal disease: Secondary | ICD-10-CM | POA: Diagnosis not present

## 2017-08-25 DIAGNOSIS — I2510 Atherosclerotic heart disease of native coronary artery without angina pectoris: Secondary | ICD-10-CM | POA: Diagnosis not present

## 2017-08-25 DIAGNOSIS — D631 Anemia in chronic kidney disease: Secondary | ICD-10-CM | POA: Diagnosis not present

## 2017-08-25 DIAGNOSIS — I471 Supraventricular tachycardia: Secondary | ICD-10-CM | POA: Diagnosis not present

## 2017-08-25 DIAGNOSIS — I480 Paroxysmal atrial fibrillation: Secondary | ICD-10-CM | POA: Diagnosis not present

## 2017-08-26 DIAGNOSIS — I1311 Hypertensive heart and chronic kidney disease without heart failure, with stage 5 chronic kidney disease, or end stage renal disease: Secondary | ICD-10-CM | POA: Diagnosis not present

## 2017-08-26 DIAGNOSIS — N186 End stage renal disease: Secondary | ICD-10-CM | POA: Diagnosis not present

## 2017-08-26 DIAGNOSIS — I480 Paroxysmal atrial fibrillation: Secondary | ICD-10-CM | POA: Diagnosis not present

## 2017-08-26 DIAGNOSIS — I471 Supraventricular tachycardia: Secondary | ICD-10-CM | POA: Diagnosis not present

## 2017-08-26 DIAGNOSIS — D631 Anemia in chronic kidney disease: Secondary | ICD-10-CM | POA: Diagnosis not present

## 2017-08-26 DIAGNOSIS — I2510 Atherosclerotic heart disease of native coronary artery without angina pectoris: Secondary | ICD-10-CM | POA: Diagnosis not present

## 2017-08-27 DIAGNOSIS — I2510 Atherosclerotic heart disease of native coronary artery without angina pectoris: Secondary | ICD-10-CM | POA: Diagnosis not present

## 2017-08-27 DIAGNOSIS — I471 Supraventricular tachycardia: Secondary | ICD-10-CM | POA: Diagnosis not present

## 2017-08-27 DIAGNOSIS — I1311 Hypertensive heart and chronic kidney disease without heart failure, with stage 5 chronic kidney disease, or end stage renal disease: Secondary | ICD-10-CM | POA: Diagnosis not present

## 2017-08-27 DIAGNOSIS — N186 End stage renal disease: Secondary | ICD-10-CM | POA: Diagnosis not present

## 2017-08-27 DIAGNOSIS — D631 Anemia in chronic kidney disease: Secondary | ICD-10-CM | POA: Diagnosis not present

## 2017-08-27 DIAGNOSIS — I480 Paroxysmal atrial fibrillation: Secondary | ICD-10-CM | POA: Diagnosis not present

## 2017-08-30 DIAGNOSIS — I1311 Hypertensive heart and chronic kidney disease without heart failure, with stage 5 chronic kidney disease, or end stage renal disease: Secondary | ICD-10-CM | POA: Diagnosis not present

## 2017-08-30 DIAGNOSIS — I471 Supraventricular tachycardia: Secondary | ICD-10-CM | POA: Diagnosis not present

## 2017-08-30 DIAGNOSIS — I480 Paroxysmal atrial fibrillation: Secondary | ICD-10-CM | POA: Diagnosis not present

## 2017-08-30 DIAGNOSIS — D631 Anemia in chronic kidney disease: Secondary | ICD-10-CM | POA: Diagnosis not present

## 2017-08-30 DIAGNOSIS — N186 End stage renal disease: Secondary | ICD-10-CM | POA: Diagnosis not present

## 2017-08-30 DIAGNOSIS — I2510 Atherosclerotic heart disease of native coronary artery without angina pectoris: Secondary | ICD-10-CM | POA: Diagnosis not present

## 2017-08-31 DIAGNOSIS — R262 Difficulty in walking, not elsewhere classified: Secondary | ICD-10-CM | POA: Diagnosis not present

## 2017-08-31 DIAGNOSIS — B351 Tinea unguium: Secondary | ICD-10-CM | POA: Diagnosis not present

## 2017-08-31 DIAGNOSIS — I2510 Atherosclerotic heart disease of native coronary artery without angina pectoris: Secondary | ICD-10-CM | POA: Diagnosis not present

## 2017-08-31 DIAGNOSIS — I1311 Hypertensive heart and chronic kidney disease without heart failure, with stage 5 chronic kidney disease, or end stage renal disease: Secondary | ICD-10-CM | POA: Diagnosis not present

## 2017-08-31 DIAGNOSIS — I471 Supraventricular tachycardia: Secondary | ICD-10-CM | POA: Diagnosis not present

## 2017-08-31 DIAGNOSIS — I739 Peripheral vascular disease, unspecified: Secondary | ICD-10-CM | POA: Diagnosis not present

## 2017-08-31 DIAGNOSIS — N186 End stage renal disease: Secondary | ICD-10-CM | POA: Diagnosis not present

## 2017-08-31 DIAGNOSIS — D631 Anemia in chronic kidney disease: Secondary | ICD-10-CM | POA: Diagnosis not present

## 2017-08-31 DIAGNOSIS — I480 Paroxysmal atrial fibrillation: Secondary | ICD-10-CM | POA: Diagnosis not present

## 2017-09-01 DIAGNOSIS — N186 End stage renal disease: Secondary | ICD-10-CM | POA: Diagnosis not present

## 2017-09-01 DIAGNOSIS — I480 Paroxysmal atrial fibrillation: Secondary | ICD-10-CM | POA: Diagnosis not present

## 2017-09-01 DIAGNOSIS — D631 Anemia in chronic kidney disease: Secondary | ICD-10-CM | POA: Diagnosis not present

## 2017-09-01 DIAGNOSIS — I1311 Hypertensive heart and chronic kidney disease without heart failure, with stage 5 chronic kidney disease, or end stage renal disease: Secondary | ICD-10-CM | POA: Diagnosis not present

## 2017-09-01 DIAGNOSIS — I471 Supraventricular tachycardia: Secondary | ICD-10-CM | POA: Diagnosis not present

## 2017-09-01 DIAGNOSIS — I2510 Atherosclerotic heart disease of native coronary artery without angina pectoris: Secondary | ICD-10-CM | POA: Diagnosis not present

## 2017-09-02 DIAGNOSIS — I480 Paroxysmal atrial fibrillation: Secondary | ICD-10-CM | POA: Diagnosis not present

## 2017-09-02 DIAGNOSIS — I2510 Atherosclerotic heart disease of native coronary artery without angina pectoris: Secondary | ICD-10-CM | POA: Diagnosis not present

## 2017-09-02 DIAGNOSIS — N186 End stage renal disease: Secondary | ICD-10-CM | POA: Diagnosis not present

## 2017-09-02 DIAGNOSIS — I471 Supraventricular tachycardia: Secondary | ICD-10-CM | POA: Diagnosis not present

## 2017-09-02 DIAGNOSIS — I1311 Hypertensive heart and chronic kidney disease without heart failure, with stage 5 chronic kidney disease, or end stage renal disease: Secondary | ICD-10-CM | POA: Diagnosis not present

## 2017-09-02 DIAGNOSIS — D631 Anemia in chronic kidney disease: Secondary | ICD-10-CM | POA: Diagnosis not present

## 2017-09-03 DIAGNOSIS — I471 Supraventricular tachycardia: Secondary | ICD-10-CM | POA: Diagnosis not present

## 2017-09-03 DIAGNOSIS — D631 Anemia in chronic kidney disease: Secondary | ICD-10-CM | POA: Diagnosis not present

## 2017-09-03 DIAGNOSIS — I480 Paroxysmal atrial fibrillation: Secondary | ICD-10-CM | POA: Diagnosis not present

## 2017-09-03 DIAGNOSIS — I2510 Atherosclerotic heart disease of native coronary artery without angina pectoris: Secondary | ICD-10-CM | POA: Diagnosis not present

## 2017-09-03 DIAGNOSIS — N186 End stage renal disease: Secondary | ICD-10-CM | POA: Diagnosis not present

## 2017-09-03 DIAGNOSIS — I1311 Hypertensive heart and chronic kidney disease without heart failure, with stage 5 chronic kidney disease, or end stage renal disease: Secondary | ICD-10-CM | POA: Diagnosis not present

## 2017-09-06 DIAGNOSIS — I1311 Hypertensive heart and chronic kidney disease without heart failure, with stage 5 chronic kidney disease, or end stage renal disease: Secondary | ICD-10-CM | POA: Diagnosis not present

## 2017-09-06 DIAGNOSIS — I2510 Atherosclerotic heart disease of native coronary artery without angina pectoris: Secondary | ICD-10-CM | POA: Diagnosis not present

## 2017-09-06 DIAGNOSIS — D631 Anemia in chronic kidney disease: Secondary | ICD-10-CM | POA: Diagnosis not present

## 2017-09-06 DIAGNOSIS — I480 Paroxysmal atrial fibrillation: Secondary | ICD-10-CM | POA: Diagnosis not present

## 2017-09-06 DIAGNOSIS — I471 Supraventricular tachycardia: Secondary | ICD-10-CM | POA: Diagnosis not present

## 2017-09-06 DIAGNOSIS — N186 End stage renal disease: Secondary | ICD-10-CM | POA: Diagnosis not present

## 2017-09-07 DIAGNOSIS — D631 Anemia in chronic kidney disease: Secondary | ICD-10-CM | POA: Diagnosis not present

## 2017-09-07 DIAGNOSIS — I471 Supraventricular tachycardia: Secondary | ICD-10-CM | POA: Diagnosis not present

## 2017-09-07 DIAGNOSIS — I1311 Hypertensive heart and chronic kidney disease without heart failure, with stage 5 chronic kidney disease, or end stage renal disease: Secondary | ICD-10-CM | POA: Diagnosis not present

## 2017-09-07 DIAGNOSIS — N186 End stage renal disease: Secondary | ICD-10-CM | POA: Diagnosis not present

## 2017-09-07 DIAGNOSIS — I2510 Atherosclerotic heart disease of native coronary artery without angina pectoris: Secondary | ICD-10-CM | POA: Diagnosis not present

## 2017-09-07 DIAGNOSIS — I480 Paroxysmal atrial fibrillation: Secondary | ICD-10-CM | POA: Diagnosis not present

## 2017-09-08 DIAGNOSIS — D631 Anemia in chronic kidney disease: Secondary | ICD-10-CM | POA: Diagnosis not present

## 2017-09-08 DIAGNOSIS — I1311 Hypertensive heart and chronic kidney disease without heart failure, with stage 5 chronic kidney disease, or end stage renal disease: Secondary | ICD-10-CM | POA: Diagnosis not present

## 2017-09-08 DIAGNOSIS — I2510 Atherosclerotic heart disease of native coronary artery without angina pectoris: Secondary | ICD-10-CM | POA: Diagnosis not present

## 2017-09-08 DIAGNOSIS — I480 Paroxysmal atrial fibrillation: Secondary | ICD-10-CM | POA: Diagnosis not present

## 2017-09-08 DIAGNOSIS — N186 End stage renal disease: Secondary | ICD-10-CM | POA: Diagnosis not present

## 2017-09-08 DIAGNOSIS — I471 Supraventricular tachycardia: Secondary | ICD-10-CM | POA: Diagnosis not present

## 2017-09-09 DIAGNOSIS — I10 Essential (primary) hypertension: Secondary | ICD-10-CM | POA: Diagnosis not present

## 2017-09-09 DIAGNOSIS — I471 Supraventricular tachycardia: Secondary | ICD-10-CM | POA: Diagnosis not present

## 2017-09-09 DIAGNOSIS — D631 Anemia in chronic kidney disease: Secondary | ICD-10-CM | POA: Diagnosis not present

## 2017-09-09 DIAGNOSIS — Z515 Encounter for palliative care: Secondary | ICD-10-CM | POA: Diagnosis not present

## 2017-09-09 DIAGNOSIS — I1311 Hypertensive heart and chronic kidney disease without heart failure, with stage 5 chronic kidney disease, or end stage renal disease: Secondary | ICD-10-CM | POA: Diagnosis not present

## 2017-09-09 DIAGNOSIS — N186 End stage renal disease: Secondary | ICD-10-CM | POA: Diagnosis not present

## 2017-09-09 DIAGNOSIS — I2510 Atherosclerotic heart disease of native coronary artery without angina pectoris: Secondary | ICD-10-CM | POA: Diagnosis not present

## 2017-09-09 DIAGNOSIS — I480 Paroxysmal atrial fibrillation: Secondary | ICD-10-CM | POA: Diagnosis not present

## 2017-09-10 DIAGNOSIS — I480 Paroxysmal atrial fibrillation: Secondary | ICD-10-CM | POA: Diagnosis not present

## 2017-09-10 DIAGNOSIS — I2510 Atherosclerotic heart disease of native coronary artery without angina pectoris: Secondary | ICD-10-CM | POA: Diagnosis not present

## 2017-09-10 DIAGNOSIS — I471 Supraventricular tachycardia: Secondary | ICD-10-CM | POA: Diagnosis not present

## 2017-09-10 DIAGNOSIS — D631 Anemia in chronic kidney disease: Secondary | ICD-10-CM | POA: Diagnosis not present

## 2017-09-10 DIAGNOSIS — N186 End stage renal disease: Secondary | ICD-10-CM | POA: Diagnosis not present

## 2017-09-10 DIAGNOSIS — I1311 Hypertensive heart and chronic kidney disease without heart failure, with stage 5 chronic kidney disease, or end stage renal disease: Secondary | ICD-10-CM | POA: Diagnosis not present

## 2017-09-13 DIAGNOSIS — I471 Supraventricular tachycardia: Secondary | ICD-10-CM | POA: Diagnosis not present

## 2017-09-13 DIAGNOSIS — I2510 Atherosclerotic heart disease of native coronary artery without angina pectoris: Secondary | ICD-10-CM | POA: Diagnosis not present

## 2017-09-13 DIAGNOSIS — I480 Paroxysmal atrial fibrillation: Secondary | ICD-10-CM | POA: Diagnosis not present

## 2017-09-13 DIAGNOSIS — D631 Anemia in chronic kidney disease: Secondary | ICD-10-CM | POA: Diagnosis not present

## 2017-09-13 DIAGNOSIS — I1311 Hypertensive heart and chronic kidney disease without heart failure, with stage 5 chronic kidney disease, or end stage renal disease: Secondary | ICD-10-CM | POA: Diagnosis not present

## 2017-09-13 DIAGNOSIS — N186 End stage renal disease: Secondary | ICD-10-CM | POA: Diagnosis not present

## 2017-09-14 DIAGNOSIS — I471 Supraventricular tachycardia: Secondary | ICD-10-CM | POA: Diagnosis not present

## 2017-09-14 DIAGNOSIS — I480 Paroxysmal atrial fibrillation: Secondary | ICD-10-CM | POA: Diagnosis not present

## 2017-09-14 DIAGNOSIS — D631 Anemia in chronic kidney disease: Secondary | ICD-10-CM | POA: Diagnosis not present

## 2017-09-14 DIAGNOSIS — N186 End stage renal disease: Secondary | ICD-10-CM | POA: Diagnosis not present

## 2017-09-14 DIAGNOSIS — I2510 Atherosclerotic heart disease of native coronary artery without angina pectoris: Secondary | ICD-10-CM | POA: Diagnosis not present

## 2017-09-14 DIAGNOSIS — I1311 Hypertensive heart and chronic kidney disease without heart failure, with stage 5 chronic kidney disease, or end stage renal disease: Secondary | ICD-10-CM | POA: Diagnosis not present

## 2017-09-15 DIAGNOSIS — I1311 Hypertensive heart and chronic kidney disease without heart failure, with stage 5 chronic kidney disease, or end stage renal disease: Secondary | ICD-10-CM | POA: Diagnosis not present

## 2017-09-15 DIAGNOSIS — I2510 Atherosclerotic heart disease of native coronary artery without angina pectoris: Secondary | ICD-10-CM | POA: Diagnosis not present

## 2017-09-15 DIAGNOSIS — N186 End stage renal disease: Secondary | ICD-10-CM | POA: Diagnosis not present

## 2017-09-15 DIAGNOSIS — I480 Paroxysmal atrial fibrillation: Secondary | ICD-10-CM | POA: Diagnosis not present

## 2017-09-15 DIAGNOSIS — I471 Supraventricular tachycardia: Secondary | ICD-10-CM | POA: Diagnosis not present

## 2017-09-15 DIAGNOSIS — D631 Anemia in chronic kidney disease: Secondary | ICD-10-CM | POA: Diagnosis not present

## 2017-09-16 DIAGNOSIS — I2510 Atherosclerotic heart disease of native coronary artery without angina pectoris: Secondary | ICD-10-CM | POA: Diagnosis not present

## 2017-09-16 DIAGNOSIS — I1311 Hypertensive heart and chronic kidney disease without heart failure, with stage 5 chronic kidney disease, or end stage renal disease: Secondary | ICD-10-CM | POA: Diagnosis not present

## 2017-09-16 DIAGNOSIS — I480 Paroxysmal atrial fibrillation: Secondary | ICD-10-CM | POA: Diagnosis not present

## 2017-09-16 DIAGNOSIS — N186 End stage renal disease: Secondary | ICD-10-CM | POA: Diagnosis not present

## 2017-09-16 DIAGNOSIS — I471 Supraventricular tachycardia: Secondary | ICD-10-CM | POA: Diagnosis not present

## 2017-09-16 DIAGNOSIS — D631 Anemia in chronic kidney disease: Secondary | ICD-10-CM | POA: Diagnosis not present

## 2017-09-17 DIAGNOSIS — E039 Hypothyroidism, unspecified: Secondary | ICD-10-CM | POA: Diagnosis not present

## 2017-09-17 DIAGNOSIS — I1311 Hypertensive heart and chronic kidney disease without heart failure, with stage 5 chronic kidney disease, or end stage renal disease: Secondary | ICD-10-CM | POA: Diagnosis not present

## 2017-09-17 DIAGNOSIS — I2510 Atherosclerotic heart disease of native coronary artery without angina pectoris: Secondary | ICD-10-CM | POA: Diagnosis not present

## 2017-09-17 DIAGNOSIS — D631 Anemia in chronic kidney disease: Secondary | ICD-10-CM | POA: Diagnosis not present

## 2017-09-17 DIAGNOSIS — N186 End stage renal disease: Secondary | ICD-10-CM | POA: Diagnosis not present

## 2017-09-17 DIAGNOSIS — I480 Paroxysmal atrial fibrillation: Secondary | ICD-10-CM | POA: Diagnosis not present

## 2017-09-17 DIAGNOSIS — I471 Supraventricular tachycardia: Secondary | ICD-10-CM | POA: Diagnosis not present

## 2017-09-20 DIAGNOSIS — I1311 Hypertensive heart and chronic kidney disease without heart failure, with stage 5 chronic kidney disease, or end stage renal disease: Secondary | ICD-10-CM | POA: Diagnosis not present

## 2017-09-20 DIAGNOSIS — I081 Rheumatic disorders of both mitral and tricuspid valves: Secondary | ICD-10-CM | POA: Diagnosis not present

## 2017-09-20 DIAGNOSIS — N186 End stage renal disease: Secondary | ICD-10-CM | POA: Diagnosis not present

## 2017-09-20 DIAGNOSIS — M109 Gout, unspecified: Secondary | ICD-10-CM | POA: Diagnosis not present

## 2017-09-20 DIAGNOSIS — I471 Supraventricular tachycardia: Secondary | ICD-10-CM | POA: Diagnosis not present

## 2017-09-20 DIAGNOSIS — S52501D Unspecified fracture of the lower end of right radius, subsequent encounter for closed fracture with routine healing: Secondary | ICD-10-CM | POA: Diagnosis not present

## 2017-09-20 DIAGNOSIS — Z6822 Body mass index (BMI) 22.0-22.9, adult: Secondary | ICD-10-CM | POA: Diagnosis not present

## 2017-09-20 DIAGNOSIS — Z9181 History of falling: Secondary | ICD-10-CM | POA: Diagnosis not present

## 2017-09-20 DIAGNOSIS — E785 Hyperlipidemia, unspecified: Secondary | ICD-10-CM | POA: Diagnosis not present

## 2017-09-20 DIAGNOSIS — F329 Major depressive disorder, single episode, unspecified: Secondary | ICD-10-CM | POA: Diagnosis not present

## 2017-09-20 DIAGNOSIS — Z7982 Long term (current) use of aspirin: Secondary | ICD-10-CM | POA: Diagnosis not present

## 2017-09-20 DIAGNOSIS — D631 Anemia in chronic kidney disease: Secondary | ICD-10-CM | POA: Diagnosis not present

## 2017-09-20 DIAGNOSIS — I480 Paroxysmal atrial fibrillation: Secondary | ICD-10-CM | POA: Diagnosis not present

## 2017-09-20 DIAGNOSIS — E559 Vitamin D deficiency, unspecified: Secondary | ICD-10-CM | POA: Diagnosis not present

## 2017-09-20 DIAGNOSIS — I2510 Atherosclerotic heart disease of native coronary artery without angina pectoris: Secondary | ICD-10-CM | POA: Diagnosis not present

## 2017-09-20 DIAGNOSIS — R627 Adult failure to thrive: Secondary | ICD-10-CM | POA: Diagnosis not present

## 2017-09-20 DIAGNOSIS — Z9981 Dependence on supplemental oxygen: Secondary | ICD-10-CM | POA: Diagnosis not present

## 2017-09-20 DIAGNOSIS — E039 Hypothyroidism, unspecified: Secondary | ICD-10-CM | POA: Diagnosis not present

## 2017-09-21 DIAGNOSIS — N186 End stage renal disease: Secondary | ICD-10-CM | POA: Diagnosis not present

## 2017-09-21 DIAGNOSIS — I2510 Atherosclerotic heart disease of native coronary artery without angina pectoris: Secondary | ICD-10-CM | POA: Diagnosis not present

## 2017-09-21 DIAGNOSIS — I1311 Hypertensive heart and chronic kidney disease without heart failure, with stage 5 chronic kidney disease, or end stage renal disease: Secondary | ICD-10-CM | POA: Diagnosis not present

## 2017-09-21 DIAGNOSIS — I471 Supraventricular tachycardia: Secondary | ICD-10-CM | POA: Diagnosis not present

## 2017-09-21 DIAGNOSIS — D631 Anemia in chronic kidney disease: Secondary | ICD-10-CM | POA: Diagnosis not present

## 2017-09-21 DIAGNOSIS — I480 Paroxysmal atrial fibrillation: Secondary | ICD-10-CM | POA: Diagnosis not present

## 2017-09-22 DIAGNOSIS — R4182 Altered mental status, unspecified: Secondary | ICD-10-CM | POA: Diagnosis not present

## 2017-09-22 DIAGNOSIS — I471 Supraventricular tachycardia: Secondary | ICD-10-CM | POA: Diagnosis not present

## 2017-09-22 DIAGNOSIS — D509 Iron deficiency anemia, unspecified: Secondary | ICD-10-CM | POA: Diagnosis not present

## 2017-09-22 DIAGNOSIS — I480 Paroxysmal atrial fibrillation: Secondary | ICD-10-CM | POA: Diagnosis not present

## 2017-09-22 DIAGNOSIS — I1311 Hypertensive heart and chronic kidney disease without heart failure, with stage 5 chronic kidney disease, or end stage renal disease: Secondary | ICD-10-CM | POA: Diagnosis not present

## 2017-09-22 DIAGNOSIS — N186 End stage renal disease: Secondary | ICD-10-CM | POA: Diagnosis not present

## 2017-09-22 DIAGNOSIS — D649 Anemia, unspecified: Secondary | ICD-10-CM | POA: Diagnosis not present

## 2017-09-22 DIAGNOSIS — D631 Anemia in chronic kidney disease: Secondary | ICD-10-CM | POA: Diagnosis not present

## 2017-09-22 DIAGNOSIS — I2510 Atherosclerotic heart disease of native coronary artery without angina pectoris: Secondary | ICD-10-CM | POA: Diagnosis not present

## 2017-09-22 DIAGNOSIS — R319 Hematuria, unspecified: Secondary | ICD-10-CM | POA: Diagnosis not present

## 2017-09-22 DIAGNOSIS — N390 Urinary tract infection, site not specified: Secondary | ICD-10-CM | POA: Diagnosis not present

## 2017-09-23 DIAGNOSIS — N186 End stage renal disease: Secondary | ICD-10-CM | POA: Diagnosis not present

## 2017-09-23 DIAGNOSIS — I1311 Hypertensive heart and chronic kidney disease without heart failure, with stage 5 chronic kidney disease, or end stage renal disease: Secondary | ICD-10-CM | POA: Diagnosis not present

## 2017-09-23 DIAGNOSIS — I480 Paroxysmal atrial fibrillation: Secondary | ICD-10-CM | POA: Diagnosis not present

## 2017-09-23 DIAGNOSIS — I2510 Atherosclerotic heart disease of native coronary artery without angina pectoris: Secondary | ICD-10-CM | POA: Diagnosis not present

## 2017-09-23 DIAGNOSIS — I471 Supraventricular tachycardia: Secondary | ICD-10-CM | POA: Diagnosis not present

## 2017-09-23 DIAGNOSIS — D631 Anemia in chronic kidney disease: Secondary | ICD-10-CM | POA: Diagnosis not present

## 2017-09-24 DIAGNOSIS — N186 End stage renal disease: Secondary | ICD-10-CM | POA: Diagnosis not present

## 2017-09-24 DIAGNOSIS — D631 Anemia in chronic kidney disease: Secondary | ICD-10-CM | POA: Diagnosis not present

## 2017-09-24 DIAGNOSIS — I480 Paroxysmal atrial fibrillation: Secondary | ICD-10-CM | POA: Diagnosis not present

## 2017-09-24 DIAGNOSIS — I471 Supraventricular tachycardia: Secondary | ICD-10-CM | POA: Diagnosis not present

## 2017-09-24 DIAGNOSIS — I1311 Hypertensive heart and chronic kidney disease without heart failure, with stage 5 chronic kidney disease, or end stage renal disease: Secondary | ICD-10-CM | POA: Diagnosis not present

## 2017-09-24 DIAGNOSIS — I2510 Atherosclerotic heart disease of native coronary artery without angina pectoris: Secondary | ICD-10-CM | POA: Diagnosis not present

## 2017-09-27 DIAGNOSIS — I480 Paroxysmal atrial fibrillation: Secondary | ICD-10-CM | POA: Diagnosis not present

## 2017-09-27 DIAGNOSIS — I1311 Hypertensive heart and chronic kidney disease without heart failure, with stage 5 chronic kidney disease, or end stage renal disease: Secondary | ICD-10-CM | POA: Diagnosis not present

## 2017-09-27 DIAGNOSIS — I2510 Atherosclerotic heart disease of native coronary artery without angina pectoris: Secondary | ICD-10-CM | POA: Diagnosis not present

## 2017-09-27 DIAGNOSIS — N186 End stage renal disease: Secondary | ICD-10-CM | POA: Diagnosis not present

## 2017-09-27 DIAGNOSIS — I471 Supraventricular tachycardia: Secondary | ICD-10-CM | POA: Diagnosis not present

## 2017-09-27 DIAGNOSIS — D631 Anemia in chronic kidney disease: Secondary | ICD-10-CM | POA: Diagnosis not present

## 2017-09-28 DIAGNOSIS — I480 Paroxysmal atrial fibrillation: Secondary | ICD-10-CM | POA: Diagnosis not present

## 2017-09-28 DIAGNOSIS — D631 Anemia in chronic kidney disease: Secondary | ICD-10-CM | POA: Diagnosis not present

## 2017-09-28 DIAGNOSIS — I2510 Atherosclerotic heart disease of native coronary artery without angina pectoris: Secondary | ICD-10-CM | POA: Diagnosis not present

## 2017-09-28 DIAGNOSIS — I1311 Hypertensive heart and chronic kidney disease without heart failure, with stage 5 chronic kidney disease, or end stage renal disease: Secondary | ICD-10-CM | POA: Diagnosis not present

## 2017-09-28 DIAGNOSIS — N186 End stage renal disease: Secondary | ICD-10-CM | POA: Diagnosis not present

## 2017-09-28 DIAGNOSIS — I471 Supraventricular tachycardia: Secondary | ICD-10-CM | POA: Diagnosis not present

## 2017-09-29 DIAGNOSIS — N186 End stage renal disease: Secondary | ICD-10-CM | POA: Diagnosis not present

## 2017-09-29 DIAGNOSIS — D631 Anemia in chronic kidney disease: Secondary | ICD-10-CM | POA: Diagnosis not present

## 2017-09-29 DIAGNOSIS — I471 Supraventricular tachycardia: Secondary | ICD-10-CM | POA: Diagnosis not present

## 2017-09-29 DIAGNOSIS — I1311 Hypertensive heart and chronic kidney disease without heart failure, with stage 5 chronic kidney disease, or end stage renal disease: Secondary | ICD-10-CM | POA: Diagnosis not present

## 2017-09-29 DIAGNOSIS — I2510 Atherosclerotic heart disease of native coronary artery without angina pectoris: Secondary | ICD-10-CM | POA: Diagnosis not present

## 2017-09-29 DIAGNOSIS — I480 Paroxysmal atrial fibrillation: Secondary | ICD-10-CM | POA: Diagnosis not present

## 2017-09-30 DIAGNOSIS — N186 End stage renal disease: Secondary | ICD-10-CM | POA: Diagnosis not present

## 2017-09-30 DIAGNOSIS — I480 Paroxysmal atrial fibrillation: Secondary | ICD-10-CM | POA: Diagnosis not present

## 2017-09-30 DIAGNOSIS — I471 Supraventricular tachycardia: Secondary | ICD-10-CM | POA: Diagnosis not present

## 2017-09-30 DIAGNOSIS — I2510 Atherosclerotic heart disease of native coronary artery without angina pectoris: Secondary | ICD-10-CM | POA: Diagnosis not present

## 2017-09-30 DIAGNOSIS — I1311 Hypertensive heart and chronic kidney disease without heart failure, with stage 5 chronic kidney disease, or end stage renal disease: Secondary | ICD-10-CM | POA: Diagnosis not present

## 2017-09-30 DIAGNOSIS — D631 Anemia in chronic kidney disease: Secondary | ICD-10-CM | POA: Diagnosis not present

## 2017-10-01 DIAGNOSIS — I1311 Hypertensive heart and chronic kidney disease without heart failure, with stage 5 chronic kidney disease, or end stage renal disease: Secondary | ICD-10-CM | POA: Diagnosis not present

## 2017-10-01 DIAGNOSIS — D631 Anemia in chronic kidney disease: Secondary | ICD-10-CM | POA: Diagnosis not present

## 2017-10-01 DIAGNOSIS — I2510 Atherosclerotic heart disease of native coronary artery without angina pectoris: Secondary | ICD-10-CM | POA: Diagnosis not present

## 2017-10-01 DIAGNOSIS — N186 End stage renal disease: Secondary | ICD-10-CM | POA: Diagnosis not present

## 2017-10-01 DIAGNOSIS — I471 Supraventricular tachycardia: Secondary | ICD-10-CM | POA: Diagnosis not present

## 2017-10-01 DIAGNOSIS — I480 Paroxysmal atrial fibrillation: Secondary | ICD-10-CM | POA: Diagnosis not present

## 2017-10-04 DIAGNOSIS — I471 Supraventricular tachycardia: Secondary | ICD-10-CM | POA: Diagnosis not present

## 2017-10-04 DIAGNOSIS — N186 End stage renal disease: Secondary | ICD-10-CM | POA: Diagnosis not present

## 2017-10-04 DIAGNOSIS — I480 Paroxysmal atrial fibrillation: Secondary | ICD-10-CM | POA: Diagnosis not present

## 2017-10-04 DIAGNOSIS — I2510 Atherosclerotic heart disease of native coronary artery without angina pectoris: Secondary | ICD-10-CM | POA: Diagnosis not present

## 2017-10-04 DIAGNOSIS — I1311 Hypertensive heart and chronic kidney disease without heart failure, with stage 5 chronic kidney disease, or end stage renal disease: Secondary | ICD-10-CM | POA: Diagnosis not present

## 2017-10-04 DIAGNOSIS — D631 Anemia in chronic kidney disease: Secondary | ICD-10-CM | POA: Diagnosis not present

## 2017-10-05 DIAGNOSIS — I471 Supraventricular tachycardia: Secondary | ICD-10-CM | POA: Diagnosis not present

## 2017-10-05 DIAGNOSIS — D631 Anemia in chronic kidney disease: Secondary | ICD-10-CM | POA: Diagnosis not present

## 2017-10-05 DIAGNOSIS — I2510 Atherosclerotic heart disease of native coronary artery without angina pectoris: Secondary | ICD-10-CM | POA: Diagnosis not present

## 2017-10-05 DIAGNOSIS — N186 End stage renal disease: Secondary | ICD-10-CM | POA: Diagnosis not present

## 2017-10-05 DIAGNOSIS — I480 Paroxysmal atrial fibrillation: Secondary | ICD-10-CM | POA: Diagnosis not present

## 2017-10-05 DIAGNOSIS — I1311 Hypertensive heart and chronic kidney disease without heart failure, with stage 5 chronic kidney disease, or end stage renal disease: Secondary | ICD-10-CM | POA: Diagnosis not present

## 2017-10-06 DIAGNOSIS — I471 Supraventricular tachycardia: Secondary | ICD-10-CM | POA: Diagnosis not present

## 2017-10-06 DIAGNOSIS — I1311 Hypertensive heart and chronic kidney disease without heart failure, with stage 5 chronic kidney disease, or end stage renal disease: Secondary | ICD-10-CM | POA: Diagnosis not present

## 2017-10-06 DIAGNOSIS — I2510 Atherosclerotic heart disease of native coronary artery without angina pectoris: Secondary | ICD-10-CM | POA: Diagnosis not present

## 2017-10-06 DIAGNOSIS — N186 End stage renal disease: Secondary | ICD-10-CM | POA: Diagnosis not present

## 2017-10-06 DIAGNOSIS — D631 Anemia in chronic kidney disease: Secondary | ICD-10-CM | POA: Diagnosis not present

## 2017-10-06 DIAGNOSIS — I480 Paroxysmal atrial fibrillation: Secondary | ICD-10-CM | POA: Diagnosis not present

## 2017-10-07 DIAGNOSIS — N186 End stage renal disease: Secondary | ICD-10-CM | POA: Diagnosis not present

## 2017-10-07 DIAGNOSIS — D631 Anemia in chronic kidney disease: Secondary | ICD-10-CM | POA: Diagnosis not present

## 2017-10-07 DIAGNOSIS — I480 Paroxysmal atrial fibrillation: Secondary | ICD-10-CM | POA: Diagnosis not present

## 2017-10-07 DIAGNOSIS — I471 Supraventricular tachycardia: Secondary | ICD-10-CM | POA: Diagnosis not present

## 2017-10-07 DIAGNOSIS — I2510 Atherosclerotic heart disease of native coronary artery without angina pectoris: Secondary | ICD-10-CM | POA: Diagnosis not present

## 2017-10-07 DIAGNOSIS — I1311 Hypertensive heart and chronic kidney disease without heart failure, with stage 5 chronic kidney disease, or end stage renal disease: Secondary | ICD-10-CM | POA: Diagnosis not present

## 2017-10-08 DIAGNOSIS — I480 Paroxysmal atrial fibrillation: Secondary | ICD-10-CM | POA: Diagnosis not present

## 2017-10-08 DIAGNOSIS — N186 End stage renal disease: Secondary | ICD-10-CM | POA: Diagnosis not present

## 2017-10-08 DIAGNOSIS — D631 Anemia in chronic kidney disease: Secondary | ICD-10-CM | POA: Diagnosis not present

## 2017-10-08 DIAGNOSIS — I471 Supraventricular tachycardia: Secondary | ICD-10-CM | POA: Diagnosis not present

## 2017-10-08 DIAGNOSIS — I1311 Hypertensive heart and chronic kidney disease without heart failure, with stage 5 chronic kidney disease, or end stage renal disease: Secondary | ICD-10-CM | POA: Diagnosis not present

## 2017-10-08 DIAGNOSIS — I2510 Atherosclerotic heart disease of native coronary artery without angina pectoris: Secondary | ICD-10-CM | POA: Diagnosis not present

## 2017-10-11 DIAGNOSIS — N186 End stage renal disease: Secondary | ICD-10-CM | POA: Diagnosis not present

## 2017-10-11 DIAGNOSIS — I2510 Atherosclerotic heart disease of native coronary artery without angina pectoris: Secondary | ICD-10-CM | POA: Diagnosis not present

## 2017-10-11 DIAGNOSIS — D631 Anemia in chronic kidney disease: Secondary | ICD-10-CM | POA: Diagnosis not present

## 2017-10-11 DIAGNOSIS — I471 Supraventricular tachycardia: Secondary | ICD-10-CM | POA: Diagnosis not present

## 2017-10-11 DIAGNOSIS — I480 Paroxysmal atrial fibrillation: Secondary | ICD-10-CM | POA: Diagnosis not present

## 2017-10-11 DIAGNOSIS — I1311 Hypertensive heart and chronic kidney disease without heart failure, with stage 5 chronic kidney disease, or end stage renal disease: Secondary | ICD-10-CM | POA: Diagnosis not present

## 2017-10-12 DIAGNOSIS — D631 Anemia in chronic kidney disease: Secondary | ICD-10-CM | POA: Diagnosis not present

## 2017-10-12 DIAGNOSIS — I2510 Atherosclerotic heart disease of native coronary artery without angina pectoris: Secondary | ICD-10-CM | POA: Diagnosis not present

## 2017-10-12 DIAGNOSIS — I480 Paroxysmal atrial fibrillation: Secondary | ICD-10-CM | POA: Diagnosis not present

## 2017-10-12 DIAGNOSIS — I471 Supraventricular tachycardia: Secondary | ICD-10-CM | POA: Diagnosis not present

## 2017-10-12 DIAGNOSIS — I1311 Hypertensive heart and chronic kidney disease without heart failure, with stage 5 chronic kidney disease, or end stage renal disease: Secondary | ICD-10-CM | POA: Diagnosis not present

## 2017-10-12 DIAGNOSIS — N186 End stage renal disease: Secondary | ICD-10-CM | POA: Diagnosis not present

## 2017-10-13 DIAGNOSIS — N186 End stage renal disease: Secondary | ICD-10-CM | POA: Diagnosis not present

## 2017-10-13 DIAGNOSIS — I471 Supraventricular tachycardia: Secondary | ICD-10-CM | POA: Diagnosis not present

## 2017-10-13 DIAGNOSIS — D631 Anemia in chronic kidney disease: Secondary | ICD-10-CM | POA: Diagnosis not present

## 2017-10-13 DIAGNOSIS — I480 Paroxysmal atrial fibrillation: Secondary | ICD-10-CM | POA: Diagnosis not present

## 2017-10-13 DIAGNOSIS — I1311 Hypertensive heart and chronic kidney disease without heart failure, with stage 5 chronic kidney disease, or end stage renal disease: Secondary | ICD-10-CM | POA: Diagnosis not present

## 2017-10-13 DIAGNOSIS — I2510 Atherosclerotic heart disease of native coronary artery without angina pectoris: Secondary | ICD-10-CM | POA: Diagnosis not present

## 2017-10-14 DIAGNOSIS — N186 End stage renal disease: Secondary | ICD-10-CM | POA: Diagnosis not present

## 2017-10-14 DIAGNOSIS — I480 Paroxysmal atrial fibrillation: Secondary | ICD-10-CM | POA: Diagnosis not present

## 2017-10-14 DIAGNOSIS — I1311 Hypertensive heart and chronic kidney disease without heart failure, with stage 5 chronic kidney disease, or end stage renal disease: Secondary | ICD-10-CM | POA: Diagnosis not present

## 2017-10-14 DIAGNOSIS — I2510 Atherosclerotic heart disease of native coronary artery without angina pectoris: Secondary | ICD-10-CM | POA: Diagnosis not present

## 2017-10-14 DIAGNOSIS — D631 Anemia in chronic kidney disease: Secondary | ICD-10-CM | POA: Diagnosis not present

## 2017-10-14 DIAGNOSIS — I471 Supraventricular tachycardia: Secondary | ICD-10-CM | POA: Diagnosis not present

## 2017-10-15 DIAGNOSIS — D631 Anemia in chronic kidney disease: Secondary | ICD-10-CM | POA: Diagnosis not present

## 2017-10-15 DIAGNOSIS — I471 Supraventricular tachycardia: Secondary | ICD-10-CM | POA: Diagnosis not present

## 2017-10-15 DIAGNOSIS — N186 End stage renal disease: Secondary | ICD-10-CM | POA: Diagnosis not present

## 2017-10-15 DIAGNOSIS — I2510 Atherosclerotic heart disease of native coronary artery without angina pectoris: Secondary | ICD-10-CM | POA: Diagnosis not present

## 2017-10-15 DIAGNOSIS — I480 Paroxysmal atrial fibrillation: Secondary | ICD-10-CM | POA: Diagnosis not present

## 2017-10-15 DIAGNOSIS — I1311 Hypertensive heart and chronic kidney disease without heart failure, with stage 5 chronic kidney disease, or end stage renal disease: Secondary | ICD-10-CM | POA: Diagnosis not present

## 2017-10-18 DIAGNOSIS — I1311 Hypertensive heart and chronic kidney disease without heart failure, with stage 5 chronic kidney disease, or end stage renal disease: Secondary | ICD-10-CM | POA: Diagnosis not present

## 2017-10-18 DIAGNOSIS — I2510 Atherosclerotic heart disease of native coronary artery without angina pectoris: Secondary | ICD-10-CM | POA: Diagnosis not present

## 2017-10-18 DIAGNOSIS — D631 Anemia in chronic kidney disease: Secondary | ICD-10-CM | POA: Diagnosis not present

## 2017-10-18 DIAGNOSIS — I480 Paroxysmal atrial fibrillation: Secondary | ICD-10-CM | POA: Diagnosis not present

## 2017-10-18 DIAGNOSIS — N186 End stage renal disease: Secondary | ICD-10-CM | POA: Diagnosis not present

## 2017-10-18 DIAGNOSIS — I471 Supraventricular tachycardia: Secondary | ICD-10-CM | POA: Diagnosis not present

## 2017-10-19 DIAGNOSIS — I2510 Atherosclerotic heart disease of native coronary artery without angina pectoris: Secondary | ICD-10-CM | POA: Diagnosis not present

## 2017-10-19 DIAGNOSIS — D631 Anemia in chronic kidney disease: Secondary | ICD-10-CM | POA: Diagnosis not present

## 2017-10-19 DIAGNOSIS — N186 End stage renal disease: Secondary | ICD-10-CM | POA: Diagnosis not present

## 2017-10-19 DIAGNOSIS — I471 Supraventricular tachycardia: Secondary | ICD-10-CM | POA: Diagnosis not present

## 2017-10-19 DIAGNOSIS — I1311 Hypertensive heart and chronic kidney disease without heart failure, with stage 5 chronic kidney disease, or end stage renal disease: Secondary | ICD-10-CM | POA: Diagnosis not present

## 2017-10-19 DIAGNOSIS — I480 Paroxysmal atrial fibrillation: Secondary | ICD-10-CM | POA: Diagnosis not present

## 2017-10-20 DIAGNOSIS — Z9181 History of falling: Secondary | ICD-10-CM | POA: Diagnosis not present

## 2017-10-20 DIAGNOSIS — I480 Paroxysmal atrial fibrillation: Secondary | ICD-10-CM | POA: Diagnosis not present

## 2017-10-20 DIAGNOSIS — I471 Supraventricular tachycardia: Secondary | ICD-10-CM | POA: Diagnosis not present

## 2017-10-20 DIAGNOSIS — I2510 Atherosclerotic heart disease of native coronary artery without angina pectoris: Secondary | ICD-10-CM | POA: Diagnosis not present

## 2017-10-20 DIAGNOSIS — D631 Anemia in chronic kidney disease: Secondary | ICD-10-CM | POA: Diagnosis not present

## 2017-10-20 DIAGNOSIS — E785 Hyperlipidemia, unspecified: Secondary | ICD-10-CM | POA: Diagnosis not present

## 2017-10-20 DIAGNOSIS — Z9981 Dependence on supplemental oxygen: Secondary | ICD-10-CM | POA: Diagnosis not present

## 2017-10-20 DIAGNOSIS — M109 Gout, unspecified: Secondary | ICD-10-CM | POA: Diagnosis not present

## 2017-10-20 DIAGNOSIS — N186 End stage renal disease: Secondary | ICD-10-CM | POA: Diagnosis not present

## 2017-10-20 DIAGNOSIS — I1311 Hypertensive heart and chronic kidney disease without heart failure, with stage 5 chronic kidney disease, or end stage renal disease: Secondary | ICD-10-CM | POA: Diagnosis not present

## 2017-10-20 DIAGNOSIS — R627 Adult failure to thrive: Secondary | ICD-10-CM | POA: Diagnosis not present

## 2017-10-20 DIAGNOSIS — Z6822 Body mass index (BMI) 22.0-22.9, adult: Secondary | ICD-10-CM | POA: Diagnosis not present

## 2017-10-20 DIAGNOSIS — E559 Vitamin D deficiency, unspecified: Secondary | ICD-10-CM | POA: Diagnosis not present

## 2017-10-20 DIAGNOSIS — F329 Major depressive disorder, single episode, unspecified: Secondary | ICD-10-CM | POA: Diagnosis not present

## 2017-10-20 DIAGNOSIS — S52501D Unspecified fracture of the lower end of right radius, subsequent encounter for closed fracture with routine healing: Secondary | ICD-10-CM | POA: Diagnosis not present

## 2017-10-20 DIAGNOSIS — E039 Hypothyroidism, unspecified: Secondary | ICD-10-CM | POA: Diagnosis not present

## 2017-10-20 DIAGNOSIS — Z7982 Long term (current) use of aspirin: Secondary | ICD-10-CM | POA: Diagnosis not present

## 2017-10-20 DIAGNOSIS — I081 Rheumatic disorders of both mitral and tricuspid valves: Secondary | ICD-10-CM | POA: Diagnosis not present

## 2017-10-21 DIAGNOSIS — N186 End stage renal disease: Secondary | ICD-10-CM | POA: Diagnosis not present

## 2017-10-21 DIAGNOSIS — I1311 Hypertensive heart and chronic kidney disease without heart failure, with stage 5 chronic kidney disease, or end stage renal disease: Secondary | ICD-10-CM | POA: Diagnosis not present

## 2017-10-21 DIAGNOSIS — D631 Anemia in chronic kidney disease: Secondary | ICD-10-CM | POA: Diagnosis not present

## 2017-10-21 DIAGNOSIS — I480 Paroxysmal atrial fibrillation: Secondary | ICD-10-CM | POA: Diagnosis not present

## 2017-10-21 DIAGNOSIS — I471 Supraventricular tachycardia: Secondary | ICD-10-CM | POA: Diagnosis not present

## 2017-10-21 DIAGNOSIS — I2510 Atherosclerotic heart disease of native coronary artery without angina pectoris: Secondary | ICD-10-CM | POA: Diagnosis not present

## 2017-10-22 DIAGNOSIS — I1311 Hypertensive heart and chronic kidney disease without heart failure, with stage 5 chronic kidney disease, or end stage renal disease: Secondary | ICD-10-CM | POA: Diagnosis not present

## 2017-10-22 DIAGNOSIS — I471 Supraventricular tachycardia: Secondary | ICD-10-CM | POA: Diagnosis not present

## 2017-10-22 DIAGNOSIS — I480 Paroxysmal atrial fibrillation: Secondary | ICD-10-CM | POA: Diagnosis not present

## 2017-10-22 DIAGNOSIS — N186 End stage renal disease: Secondary | ICD-10-CM | POA: Diagnosis not present

## 2017-10-22 DIAGNOSIS — D631 Anemia in chronic kidney disease: Secondary | ICD-10-CM | POA: Diagnosis not present

## 2017-10-22 DIAGNOSIS — I2510 Atherosclerotic heart disease of native coronary artery without angina pectoris: Secondary | ICD-10-CM | POA: Diagnosis not present

## 2017-10-25 DIAGNOSIS — I471 Supraventricular tachycardia: Secondary | ICD-10-CM | POA: Diagnosis not present

## 2017-10-25 DIAGNOSIS — I1311 Hypertensive heart and chronic kidney disease without heart failure, with stage 5 chronic kidney disease, or end stage renal disease: Secondary | ICD-10-CM | POA: Diagnosis not present

## 2017-10-25 DIAGNOSIS — I480 Paroxysmal atrial fibrillation: Secondary | ICD-10-CM | POA: Diagnosis not present

## 2017-10-25 DIAGNOSIS — N186 End stage renal disease: Secondary | ICD-10-CM | POA: Diagnosis not present

## 2017-10-25 DIAGNOSIS — I2510 Atherosclerotic heart disease of native coronary artery without angina pectoris: Secondary | ICD-10-CM | POA: Diagnosis not present

## 2017-10-25 DIAGNOSIS — D631 Anemia in chronic kidney disease: Secondary | ICD-10-CM | POA: Diagnosis not present

## 2017-10-26 DIAGNOSIS — I471 Supraventricular tachycardia: Secondary | ICD-10-CM | POA: Diagnosis not present

## 2017-10-26 DIAGNOSIS — N186 End stage renal disease: Secondary | ICD-10-CM | POA: Diagnosis not present

## 2017-10-26 DIAGNOSIS — I1311 Hypertensive heart and chronic kidney disease without heart failure, with stage 5 chronic kidney disease, or end stage renal disease: Secondary | ICD-10-CM | POA: Diagnosis not present

## 2017-10-26 DIAGNOSIS — I480 Paroxysmal atrial fibrillation: Secondary | ICD-10-CM | POA: Diagnosis not present

## 2017-10-26 DIAGNOSIS — D631 Anemia in chronic kidney disease: Secondary | ICD-10-CM | POA: Diagnosis not present

## 2017-10-26 DIAGNOSIS — I2510 Atherosclerotic heart disease of native coronary artery without angina pectoris: Secondary | ICD-10-CM | POA: Diagnosis not present

## 2017-10-27 DIAGNOSIS — I1311 Hypertensive heart and chronic kidney disease without heart failure, with stage 5 chronic kidney disease, or end stage renal disease: Secondary | ICD-10-CM | POA: Diagnosis not present

## 2017-10-27 DIAGNOSIS — N186 End stage renal disease: Secondary | ICD-10-CM | POA: Diagnosis not present

## 2017-10-27 DIAGNOSIS — D631 Anemia in chronic kidney disease: Secondary | ICD-10-CM | POA: Diagnosis not present

## 2017-10-27 DIAGNOSIS — I2510 Atherosclerotic heart disease of native coronary artery without angina pectoris: Secondary | ICD-10-CM | POA: Diagnosis not present

## 2017-10-27 DIAGNOSIS — I480 Paroxysmal atrial fibrillation: Secondary | ICD-10-CM | POA: Diagnosis not present

## 2017-10-27 DIAGNOSIS — I471 Supraventricular tachycardia: Secondary | ICD-10-CM | POA: Diagnosis not present

## 2017-10-28 DIAGNOSIS — I1311 Hypertensive heart and chronic kidney disease without heart failure, with stage 5 chronic kidney disease, or end stage renal disease: Secondary | ICD-10-CM | POA: Diagnosis not present

## 2017-10-28 DIAGNOSIS — N186 End stage renal disease: Secondary | ICD-10-CM | POA: Diagnosis not present

## 2017-10-28 DIAGNOSIS — D631 Anemia in chronic kidney disease: Secondary | ICD-10-CM | POA: Diagnosis not present

## 2017-10-28 DIAGNOSIS — I2510 Atherosclerotic heart disease of native coronary artery without angina pectoris: Secondary | ICD-10-CM | POA: Diagnosis not present

## 2017-10-28 DIAGNOSIS — I480 Paroxysmal atrial fibrillation: Secondary | ICD-10-CM | POA: Diagnosis not present

## 2017-10-28 DIAGNOSIS — I471 Supraventricular tachycardia: Secondary | ICD-10-CM | POA: Diagnosis not present

## 2017-10-29 DIAGNOSIS — I2510 Atherosclerotic heart disease of native coronary artery without angina pectoris: Secondary | ICD-10-CM | POA: Diagnosis not present

## 2017-10-29 DIAGNOSIS — I480 Paroxysmal atrial fibrillation: Secondary | ICD-10-CM | POA: Diagnosis not present

## 2017-10-29 DIAGNOSIS — I471 Supraventricular tachycardia: Secondary | ICD-10-CM | POA: Diagnosis not present

## 2017-10-29 DIAGNOSIS — I1311 Hypertensive heart and chronic kidney disease without heart failure, with stage 5 chronic kidney disease, or end stage renal disease: Secondary | ICD-10-CM | POA: Diagnosis not present

## 2017-10-29 DIAGNOSIS — D631 Anemia in chronic kidney disease: Secondary | ICD-10-CM | POA: Diagnosis not present

## 2017-10-29 DIAGNOSIS — N186 End stage renal disease: Secondary | ICD-10-CM | POA: Diagnosis not present

## 2017-11-01 DIAGNOSIS — D631 Anemia in chronic kidney disease: Secondary | ICD-10-CM | POA: Diagnosis not present

## 2017-11-01 DIAGNOSIS — N186 End stage renal disease: Secondary | ICD-10-CM | POA: Diagnosis not present

## 2017-11-01 DIAGNOSIS — I480 Paroxysmal atrial fibrillation: Secondary | ICD-10-CM | POA: Diagnosis not present

## 2017-11-01 DIAGNOSIS — I1311 Hypertensive heart and chronic kidney disease without heart failure, with stage 5 chronic kidney disease, or end stage renal disease: Secondary | ICD-10-CM | POA: Diagnosis not present

## 2017-11-01 DIAGNOSIS — I2510 Atherosclerotic heart disease of native coronary artery without angina pectoris: Secondary | ICD-10-CM | POA: Diagnosis not present

## 2017-11-01 DIAGNOSIS — I471 Supraventricular tachycardia: Secondary | ICD-10-CM | POA: Diagnosis not present

## 2017-11-02 DIAGNOSIS — D631 Anemia in chronic kidney disease: Secondary | ICD-10-CM | POA: Diagnosis not present

## 2017-11-02 DIAGNOSIS — N186 End stage renal disease: Secondary | ICD-10-CM | POA: Diagnosis not present

## 2017-11-02 DIAGNOSIS — R4182 Altered mental status, unspecified: Secondary | ICD-10-CM | POA: Diagnosis not present

## 2017-11-02 DIAGNOSIS — I1311 Hypertensive heart and chronic kidney disease without heart failure, with stage 5 chronic kidney disease, or end stage renal disease: Secondary | ICD-10-CM | POA: Diagnosis not present

## 2017-11-02 DIAGNOSIS — R319 Hematuria, unspecified: Secondary | ICD-10-CM | POA: Diagnosis not present

## 2017-11-02 DIAGNOSIS — I471 Supraventricular tachycardia: Secondary | ICD-10-CM | POA: Diagnosis not present

## 2017-11-02 DIAGNOSIS — I2510 Atherosclerotic heart disease of native coronary artery without angina pectoris: Secondary | ICD-10-CM | POA: Diagnosis not present

## 2017-11-02 DIAGNOSIS — I480 Paroxysmal atrial fibrillation: Secondary | ICD-10-CM | POA: Diagnosis not present

## 2017-11-02 DIAGNOSIS — D649 Anemia, unspecified: Secondary | ICD-10-CM | POA: Diagnosis not present

## 2017-11-02 DIAGNOSIS — N390 Urinary tract infection, site not specified: Secondary | ICD-10-CM | POA: Diagnosis not present

## 2017-11-03 DIAGNOSIS — D631 Anemia in chronic kidney disease: Secondary | ICD-10-CM | POA: Diagnosis not present

## 2017-11-03 DIAGNOSIS — I1311 Hypertensive heart and chronic kidney disease without heart failure, with stage 5 chronic kidney disease, or end stage renal disease: Secondary | ICD-10-CM | POA: Diagnosis not present

## 2017-11-03 DIAGNOSIS — I480 Paroxysmal atrial fibrillation: Secondary | ICD-10-CM | POA: Diagnosis not present

## 2017-11-03 DIAGNOSIS — N186 End stage renal disease: Secondary | ICD-10-CM | POA: Diagnosis not present

## 2017-11-03 DIAGNOSIS — I2510 Atherosclerotic heart disease of native coronary artery without angina pectoris: Secondary | ICD-10-CM | POA: Diagnosis not present

## 2017-11-03 DIAGNOSIS — I471 Supraventricular tachycardia: Secondary | ICD-10-CM | POA: Diagnosis not present

## 2017-11-04 DIAGNOSIS — I471 Supraventricular tachycardia: Secondary | ICD-10-CM | POA: Diagnosis not present

## 2017-11-04 DIAGNOSIS — I480 Paroxysmal atrial fibrillation: Secondary | ICD-10-CM | POA: Diagnosis not present

## 2017-11-04 DIAGNOSIS — D631 Anemia in chronic kidney disease: Secondary | ICD-10-CM | POA: Diagnosis not present

## 2017-11-04 DIAGNOSIS — R451 Restlessness and agitation: Secondary | ICD-10-CM | POA: Diagnosis not present

## 2017-11-04 DIAGNOSIS — I2510 Atherosclerotic heart disease of native coronary artery without angina pectoris: Secondary | ICD-10-CM | POA: Diagnosis not present

## 2017-11-04 DIAGNOSIS — N186 End stage renal disease: Secondary | ICD-10-CM | POA: Diagnosis not present

## 2017-11-04 DIAGNOSIS — R300 Dysuria: Secondary | ICD-10-CM | POA: Diagnosis not present

## 2017-11-04 DIAGNOSIS — I1311 Hypertensive heart and chronic kidney disease without heart failure, with stage 5 chronic kidney disease, or end stage renal disease: Secondary | ICD-10-CM | POA: Diagnosis not present

## 2017-11-05 DIAGNOSIS — I2510 Atherosclerotic heart disease of native coronary artery without angina pectoris: Secondary | ICD-10-CM | POA: Diagnosis not present

## 2017-11-05 DIAGNOSIS — I480 Paroxysmal atrial fibrillation: Secondary | ICD-10-CM | POA: Diagnosis not present

## 2017-11-05 DIAGNOSIS — I471 Supraventricular tachycardia: Secondary | ICD-10-CM | POA: Diagnosis not present

## 2017-11-05 DIAGNOSIS — D631 Anemia in chronic kidney disease: Secondary | ICD-10-CM | POA: Diagnosis not present

## 2017-11-05 DIAGNOSIS — I1311 Hypertensive heart and chronic kidney disease without heart failure, with stage 5 chronic kidney disease, or end stage renal disease: Secondary | ICD-10-CM | POA: Diagnosis not present

## 2017-11-05 DIAGNOSIS — N186 End stage renal disease: Secondary | ICD-10-CM | POA: Diagnosis not present

## 2017-11-08 DIAGNOSIS — N186 End stage renal disease: Secondary | ICD-10-CM | POA: Diagnosis not present

## 2017-11-08 DIAGNOSIS — I471 Supraventricular tachycardia: Secondary | ICD-10-CM | POA: Diagnosis not present

## 2017-11-08 DIAGNOSIS — I480 Paroxysmal atrial fibrillation: Secondary | ICD-10-CM | POA: Diagnosis not present

## 2017-11-08 DIAGNOSIS — I1311 Hypertensive heart and chronic kidney disease without heart failure, with stage 5 chronic kidney disease, or end stage renal disease: Secondary | ICD-10-CM | POA: Diagnosis not present

## 2017-11-08 DIAGNOSIS — I2510 Atherosclerotic heart disease of native coronary artery without angina pectoris: Secondary | ICD-10-CM | POA: Diagnosis not present

## 2017-11-08 DIAGNOSIS — D631 Anemia in chronic kidney disease: Secondary | ICD-10-CM | POA: Diagnosis not present

## 2017-11-09 DIAGNOSIS — L22 Diaper dermatitis: Secondary | ICD-10-CM | POA: Diagnosis not present

## 2017-11-09 DIAGNOSIS — I471 Supraventricular tachycardia: Secondary | ICD-10-CM | POA: Diagnosis not present

## 2017-11-09 DIAGNOSIS — I2510 Atherosclerotic heart disease of native coronary artery without angina pectoris: Secondary | ICD-10-CM | POA: Diagnosis not present

## 2017-11-09 DIAGNOSIS — I1311 Hypertensive heart and chronic kidney disease without heart failure, with stage 5 chronic kidney disease, or end stage renal disease: Secondary | ICD-10-CM | POA: Diagnosis not present

## 2017-11-09 DIAGNOSIS — N186 End stage renal disease: Secondary | ICD-10-CM | POA: Diagnosis not present

## 2017-11-09 DIAGNOSIS — I480 Paroxysmal atrial fibrillation: Secondary | ICD-10-CM | POA: Diagnosis not present

## 2017-11-09 DIAGNOSIS — D631 Anemia in chronic kidney disease: Secondary | ICD-10-CM | POA: Diagnosis not present

## 2017-11-10 DIAGNOSIS — I1311 Hypertensive heart and chronic kidney disease without heart failure, with stage 5 chronic kidney disease, or end stage renal disease: Secondary | ICD-10-CM | POA: Diagnosis not present

## 2017-11-10 DIAGNOSIS — I480 Paroxysmal atrial fibrillation: Secondary | ICD-10-CM | POA: Diagnosis not present

## 2017-11-10 DIAGNOSIS — I471 Supraventricular tachycardia: Secondary | ICD-10-CM | POA: Diagnosis not present

## 2017-11-10 DIAGNOSIS — I2510 Atherosclerotic heart disease of native coronary artery without angina pectoris: Secondary | ICD-10-CM | POA: Diagnosis not present

## 2017-11-10 DIAGNOSIS — D631 Anemia in chronic kidney disease: Secondary | ICD-10-CM | POA: Diagnosis not present

## 2017-11-10 DIAGNOSIS — N186 End stage renal disease: Secondary | ICD-10-CM | POA: Diagnosis not present

## 2017-11-11 DIAGNOSIS — N186 End stage renal disease: Secondary | ICD-10-CM | POA: Diagnosis not present

## 2017-11-11 DIAGNOSIS — D631 Anemia in chronic kidney disease: Secondary | ICD-10-CM | POA: Diagnosis not present

## 2017-11-11 DIAGNOSIS — I480 Paroxysmal atrial fibrillation: Secondary | ICD-10-CM | POA: Diagnosis not present

## 2017-11-11 DIAGNOSIS — I1311 Hypertensive heart and chronic kidney disease without heart failure, with stage 5 chronic kidney disease, or end stage renal disease: Secondary | ICD-10-CM | POA: Diagnosis not present

## 2017-11-11 DIAGNOSIS — I2510 Atherosclerotic heart disease of native coronary artery without angina pectoris: Secondary | ICD-10-CM | POA: Diagnosis not present

## 2017-11-11 DIAGNOSIS — I471 Supraventricular tachycardia: Secondary | ICD-10-CM | POA: Diagnosis not present

## 2017-11-12 DIAGNOSIS — I2510 Atherosclerotic heart disease of native coronary artery without angina pectoris: Secondary | ICD-10-CM | POA: Diagnosis not present

## 2017-11-12 DIAGNOSIS — I471 Supraventricular tachycardia: Secondary | ICD-10-CM | POA: Diagnosis not present

## 2017-11-12 DIAGNOSIS — I480 Paroxysmal atrial fibrillation: Secondary | ICD-10-CM | POA: Diagnosis not present

## 2017-11-12 DIAGNOSIS — I1311 Hypertensive heart and chronic kidney disease without heart failure, with stage 5 chronic kidney disease, or end stage renal disease: Secondary | ICD-10-CM | POA: Diagnosis not present

## 2017-11-12 DIAGNOSIS — D631 Anemia in chronic kidney disease: Secondary | ICD-10-CM | POA: Diagnosis not present

## 2017-11-12 DIAGNOSIS — N186 End stage renal disease: Secondary | ICD-10-CM | POA: Diagnosis not present

## 2017-11-15 DIAGNOSIS — I1311 Hypertensive heart and chronic kidney disease without heart failure, with stage 5 chronic kidney disease, or end stage renal disease: Secondary | ICD-10-CM | POA: Diagnosis not present

## 2017-11-15 DIAGNOSIS — I2510 Atherosclerotic heart disease of native coronary artery without angina pectoris: Secondary | ICD-10-CM | POA: Diagnosis not present

## 2017-11-15 DIAGNOSIS — N186 End stage renal disease: Secondary | ICD-10-CM | POA: Diagnosis not present

## 2017-11-15 DIAGNOSIS — I480 Paroxysmal atrial fibrillation: Secondary | ICD-10-CM | POA: Diagnosis not present

## 2017-11-15 DIAGNOSIS — I471 Supraventricular tachycardia: Secondary | ICD-10-CM | POA: Diagnosis not present

## 2017-11-15 DIAGNOSIS — D631 Anemia in chronic kidney disease: Secondary | ICD-10-CM | POA: Diagnosis not present

## 2017-11-16 DIAGNOSIS — I1311 Hypertensive heart and chronic kidney disease without heart failure, with stage 5 chronic kidney disease, or end stage renal disease: Secondary | ICD-10-CM | POA: Diagnosis not present

## 2017-11-16 DIAGNOSIS — I2510 Atherosclerotic heart disease of native coronary artery without angina pectoris: Secondary | ICD-10-CM | POA: Diagnosis not present

## 2017-11-16 DIAGNOSIS — N186 End stage renal disease: Secondary | ICD-10-CM | POA: Diagnosis not present

## 2017-11-16 DIAGNOSIS — I480 Paroxysmal atrial fibrillation: Secondary | ICD-10-CM | POA: Diagnosis not present

## 2017-11-16 DIAGNOSIS — D631 Anemia in chronic kidney disease: Secondary | ICD-10-CM | POA: Diagnosis not present

## 2017-11-16 DIAGNOSIS — I471 Supraventricular tachycardia: Secondary | ICD-10-CM | POA: Diagnosis not present

## 2017-11-17 DIAGNOSIS — I471 Supraventricular tachycardia: Secondary | ICD-10-CM | POA: Diagnosis not present

## 2017-11-17 DIAGNOSIS — I480 Paroxysmal atrial fibrillation: Secondary | ICD-10-CM | POA: Diagnosis not present

## 2017-11-17 DIAGNOSIS — I2510 Atherosclerotic heart disease of native coronary artery without angina pectoris: Secondary | ICD-10-CM | POA: Diagnosis not present

## 2017-11-17 DIAGNOSIS — N186 End stage renal disease: Secondary | ICD-10-CM | POA: Diagnosis not present

## 2017-11-17 DIAGNOSIS — I1311 Hypertensive heart and chronic kidney disease without heart failure, with stage 5 chronic kidney disease, or end stage renal disease: Secondary | ICD-10-CM | POA: Diagnosis not present

## 2017-11-17 DIAGNOSIS — D631 Anemia in chronic kidney disease: Secondary | ICD-10-CM | POA: Diagnosis not present

## 2017-11-18 DIAGNOSIS — N186 End stage renal disease: Secondary | ICD-10-CM | POA: Diagnosis not present

## 2017-11-18 DIAGNOSIS — I480 Paroxysmal atrial fibrillation: Secondary | ICD-10-CM | POA: Diagnosis not present

## 2017-11-18 DIAGNOSIS — I2510 Atherosclerotic heart disease of native coronary artery without angina pectoris: Secondary | ICD-10-CM | POA: Diagnosis not present

## 2017-11-18 DIAGNOSIS — I471 Supraventricular tachycardia: Secondary | ICD-10-CM | POA: Diagnosis not present

## 2017-11-18 DIAGNOSIS — D631 Anemia in chronic kidney disease: Secondary | ICD-10-CM | POA: Diagnosis not present

## 2017-11-18 DIAGNOSIS — I1311 Hypertensive heart and chronic kidney disease without heart failure, with stage 5 chronic kidney disease, or end stage renal disease: Secondary | ICD-10-CM | POA: Diagnosis not present

## 2017-11-19 DIAGNOSIS — I480 Paroxysmal atrial fibrillation: Secondary | ICD-10-CM | POA: Diagnosis not present

## 2017-11-19 DIAGNOSIS — I1311 Hypertensive heart and chronic kidney disease without heart failure, with stage 5 chronic kidney disease, or end stage renal disease: Secondary | ICD-10-CM | POA: Diagnosis not present

## 2017-11-19 DIAGNOSIS — I471 Supraventricular tachycardia: Secondary | ICD-10-CM | POA: Diagnosis not present

## 2017-11-19 DIAGNOSIS — I2510 Atherosclerotic heart disease of native coronary artery without angina pectoris: Secondary | ICD-10-CM | POA: Diagnosis not present

## 2017-11-19 DIAGNOSIS — D631 Anemia in chronic kidney disease: Secondary | ICD-10-CM | POA: Diagnosis not present

## 2017-11-19 DIAGNOSIS — N186 End stage renal disease: Secondary | ICD-10-CM | POA: Diagnosis not present

## 2017-11-20 DIAGNOSIS — Z9181 History of falling: Secondary | ICD-10-CM | POA: Diagnosis not present

## 2017-11-20 DIAGNOSIS — E559 Vitamin D deficiency, unspecified: Secondary | ICD-10-CM | POA: Diagnosis not present

## 2017-11-20 DIAGNOSIS — I1311 Hypertensive heart and chronic kidney disease without heart failure, with stage 5 chronic kidney disease, or end stage renal disease: Secondary | ICD-10-CM | POA: Diagnosis not present

## 2017-11-20 DIAGNOSIS — F329 Major depressive disorder, single episode, unspecified: Secondary | ICD-10-CM | POA: Diagnosis not present

## 2017-11-20 DIAGNOSIS — I471 Supraventricular tachycardia: Secondary | ICD-10-CM | POA: Diagnosis not present

## 2017-11-20 DIAGNOSIS — M109 Gout, unspecified: Secondary | ICD-10-CM | POA: Diagnosis not present

## 2017-11-20 DIAGNOSIS — I2510 Atherosclerotic heart disease of native coronary artery without angina pectoris: Secondary | ICD-10-CM | POA: Diagnosis not present

## 2017-11-20 DIAGNOSIS — I480 Paroxysmal atrial fibrillation: Secondary | ICD-10-CM | POA: Diagnosis not present

## 2017-11-20 DIAGNOSIS — E785 Hyperlipidemia, unspecified: Secondary | ICD-10-CM | POA: Diagnosis not present

## 2017-11-20 DIAGNOSIS — D631 Anemia in chronic kidney disease: Secondary | ICD-10-CM | POA: Diagnosis not present

## 2017-11-20 DIAGNOSIS — S52501D Unspecified fracture of the lower end of right radius, subsequent encounter for closed fracture with routine healing: Secondary | ICD-10-CM | POA: Diagnosis not present

## 2017-11-20 DIAGNOSIS — I081 Rheumatic disorders of both mitral and tricuspid valves: Secondary | ICD-10-CM | POA: Diagnosis not present

## 2017-11-20 DIAGNOSIS — N186 End stage renal disease: Secondary | ICD-10-CM | POA: Diagnosis not present

## 2017-11-20 DIAGNOSIS — Z7982 Long term (current) use of aspirin: Secondary | ICD-10-CM | POA: Diagnosis not present

## 2017-11-20 DIAGNOSIS — Z6822 Body mass index (BMI) 22.0-22.9, adult: Secondary | ICD-10-CM | POA: Diagnosis not present

## 2017-11-20 DIAGNOSIS — Z9981 Dependence on supplemental oxygen: Secondary | ICD-10-CM | POA: Diagnosis not present

## 2017-11-20 DIAGNOSIS — E039 Hypothyroidism, unspecified: Secondary | ICD-10-CM | POA: Diagnosis not present

## 2017-11-20 DIAGNOSIS — R627 Adult failure to thrive: Secondary | ICD-10-CM | POA: Diagnosis not present

## 2017-11-22 DIAGNOSIS — N186 End stage renal disease: Secondary | ICD-10-CM | POA: Diagnosis not present

## 2017-11-22 DIAGNOSIS — I1311 Hypertensive heart and chronic kidney disease without heart failure, with stage 5 chronic kidney disease, or end stage renal disease: Secondary | ICD-10-CM | POA: Diagnosis not present

## 2017-11-22 DIAGNOSIS — I480 Paroxysmal atrial fibrillation: Secondary | ICD-10-CM | POA: Diagnosis not present

## 2017-11-22 DIAGNOSIS — I471 Supraventricular tachycardia: Secondary | ICD-10-CM | POA: Diagnosis not present

## 2017-11-22 DIAGNOSIS — D631 Anemia in chronic kidney disease: Secondary | ICD-10-CM | POA: Diagnosis not present

## 2017-11-22 DIAGNOSIS — I2510 Atherosclerotic heart disease of native coronary artery without angina pectoris: Secondary | ICD-10-CM | POA: Diagnosis not present

## 2017-11-23 DIAGNOSIS — N186 End stage renal disease: Secondary | ICD-10-CM | POA: Diagnosis not present

## 2017-11-23 DIAGNOSIS — D631 Anemia in chronic kidney disease: Secondary | ICD-10-CM | POA: Diagnosis not present

## 2017-11-23 DIAGNOSIS — E119 Type 2 diabetes mellitus without complications: Secondary | ICD-10-CM | POA: Diagnosis not present

## 2017-11-23 DIAGNOSIS — I1311 Hypertensive heart and chronic kidney disease without heart failure, with stage 5 chronic kidney disease, or end stage renal disease: Secondary | ICD-10-CM | POA: Diagnosis not present

## 2017-11-23 DIAGNOSIS — I480 Paroxysmal atrial fibrillation: Secondary | ICD-10-CM | POA: Diagnosis not present

## 2017-11-23 DIAGNOSIS — N184 Chronic kidney disease, stage 4 (severe): Secondary | ICD-10-CM | POA: Diagnosis not present

## 2017-11-23 DIAGNOSIS — R451 Restlessness and agitation: Secondary | ICD-10-CM | POA: Diagnosis not present

## 2017-11-23 DIAGNOSIS — I2510 Atherosclerotic heart disease of native coronary artery without angina pectoris: Secondary | ICD-10-CM | POA: Diagnosis not present

## 2017-11-23 DIAGNOSIS — I471 Supraventricular tachycardia: Secondary | ICD-10-CM | POA: Diagnosis not present

## 2017-11-24 DIAGNOSIS — R262 Difficulty in walking, not elsewhere classified: Secondary | ICD-10-CM | POA: Diagnosis not present

## 2017-11-24 DIAGNOSIS — N186 End stage renal disease: Secondary | ICD-10-CM | POA: Diagnosis not present

## 2017-11-24 DIAGNOSIS — I480 Paroxysmal atrial fibrillation: Secondary | ICD-10-CM | POA: Diagnosis not present

## 2017-11-24 DIAGNOSIS — I1311 Hypertensive heart and chronic kidney disease without heart failure, with stage 5 chronic kidney disease, or end stage renal disease: Secondary | ICD-10-CM | POA: Diagnosis not present

## 2017-11-24 DIAGNOSIS — I2510 Atherosclerotic heart disease of native coronary artery without angina pectoris: Secondary | ICD-10-CM | POA: Diagnosis not present

## 2017-11-24 DIAGNOSIS — D631 Anemia in chronic kidney disease: Secondary | ICD-10-CM | POA: Diagnosis not present

## 2017-11-24 DIAGNOSIS — I471 Supraventricular tachycardia: Secondary | ICD-10-CM | POA: Diagnosis not present

## 2017-11-24 DIAGNOSIS — B351 Tinea unguium: Secondary | ICD-10-CM | POA: Diagnosis not present

## 2017-11-24 DIAGNOSIS — I739 Peripheral vascular disease, unspecified: Secondary | ICD-10-CM | POA: Diagnosis not present

## 2017-11-25 DIAGNOSIS — N186 End stage renal disease: Secondary | ICD-10-CM | POA: Diagnosis not present

## 2017-11-25 DIAGNOSIS — I1311 Hypertensive heart and chronic kidney disease without heart failure, with stage 5 chronic kidney disease, or end stage renal disease: Secondary | ICD-10-CM | POA: Diagnosis not present

## 2017-11-25 DIAGNOSIS — D631 Anemia in chronic kidney disease: Secondary | ICD-10-CM | POA: Diagnosis not present

## 2017-11-25 DIAGNOSIS — I480 Paroxysmal atrial fibrillation: Secondary | ICD-10-CM | POA: Diagnosis not present

## 2017-11-25 DIAGNOSIS — I2510 Atherosclerotic heart disease of native coronary artery without angina pectoris: Secondary | ICD-10-CM | POA: Diagnosis not present

## 2017-11-25 DIAGNOSIS — I471 Supraventricular tachycardia: Secondary | ICD-10-CM | POA: Diagnosis not present

## 2017-11-26 DIAGNOSIS — I1311 Hypertensive heart and chronic kidney disease without heart failure, with stage 5 chronic kidney disease, or end stage renal disease: Secondary | ICD-10-CM | POA: Diagnosis not present

## 2017-11-26 DIAGNOSIS — I2510 Atherosclerotic heart disease of native coronary artery without angina pectoris: Secondary | ICD-10-CM | POA: Diagnosis not present

## 2017-11-26 DIAGNOSIS — I471 Supraventricular tachycardia: Secondary | ICD-10-CM | POA: Diagnosis not present

## 2017-11-26 DIAGNOSIS — D631 Anemia in chronic kidney disease: Secondary | ICD-10-CM | POA: Diagnosis not present

## 2017-11-26 DIAGNOSIS — N186 End stage renal disease: Secondary | ICD-10-CM | POA: Diagnosis not present

## 2017-11-26 DIAGNOSIS — I480 Paroxysmal atrial fibrillation: Secondary | ICD-10-CM | POA: Diagnosis not present

## 2017-11-29 DIAGNOSIS — N186 End stage renal disease: Secondary | ICD-10-CM | POA: Diagnosis not present

## 2017-11-29 DIAGNOSIS — I480 Paroxysmal atrial fibrillation: Secondary | ICD-10-CM | POA: Diagnosis not present

## 2017-11-29 DIAGNOSIS — I471 Supraventricular tachycardia: Secondary | ICD-10-CM | POA: Diagnosis not present

## 2017-11-29 DIAGNOSIS — I1311 Hypertensive heart and chronic kidney disease without heart failure, with stage 5 chronic kidney disease, or end stage renal disease: Secondary | ICD-10-CM | POA: Diagnosis not present

## 2017-11-29 DIAGNOSIS — I2510 Atherosclerotic heart disease of native coronary artery without angina pectoris: Secondary | ICD-10-CM | POA: Diagnosis not present

## 2017-11-29 DIAGNOSIS — D631 Anemia in chronic kidney disease: Secondary | ICD-10-CM | POA: Diagnosis not present

## 2017-11-30 DIAGNOSIS — I1311 Hypertensive heart and chronic kidney disease without heart failure, with stage 5 chronic kidney disease, or end stage renal disease: Secondary | ICD-10-CM | POA: Diagnosis not present

## 2017-11-30 DIAGNOSIS — I2510 Atherosclerotic heart disease of native coronary artery without angina pectoris: Secondary | ICD-10-CM | POA: Diagnosis not present

## 2017-11-30 DIAGNOSIS — I480 Paroxysmal atrial fibrillation: Secondary | ICD-10-CM | POA: Diagnosis not present

## 2017-11-30 DIAGNOSIS — D631 Anemia in chronic kidney disease: Secondary | ICD-10-CM | POA: Diagnosis not present

## 2017-11-30 DIAGNOSIS — N186 End stage renal disease: Secondary | ICD-10-CM | POA: Diagnosis not present

## 2017-11-30 DIAGNOSIS — I471 Supraventricular tachycardia: Secondary | ICD-10-CM | POA: Diagnosis not present

## 2017-12-01 DIAGNOSIS — I1311 Hypertensive heart and chronic kidney disease without heart failure, with stage 5 chronic kidney disease, or end stage renal disease: Secondary | ICD-10-CM | POA: Diagnosis not present

## 2017-12-01 DIAGNOSIS — I2510 Atherosclerotic heart disease of native coronary artery without angina pectoris: Secondary | ICD-10-CM | POA: Diagnosis not present

## 2017-12-01 DIAGNOSIS — N186 End stage renal disease: Secondary | ICD-10-CM | POA: Diagnosis not present

## 2017-12-01 DIAGNOSIS — I471 Supraventricular tachycardia: Secondary | ICD-10-CM | POA: Diagnosis not present

## 2017-12-01 DIAGNOSIS — I480 Paroxysmal atrial fibrillation: Secondary | ICD-10-CM | POA: Diagnosis not present

## 2017-12-01 DIAGNOSIS — D631 Anemia in chronic kidney disease: Secondary | ICD-10-CM | POA: Diagnosis not present

## 2017-12-02 DIAGNOSIS — D631 Anemia in chronic kidney disease: Secondary | ICD-10-CM | POA: Diagnosis not present

## 2017-12-02 DIAGNOSIS — I480 Paroxysmal atrial fibrillation: Secondary | ICD-10-CM | POA: Diagnosis not present

## 2017-12-02 DIAGNOSIS — N186 End stage renal disease: Secondary | ICD-10-CM | POA: Diagnosis not present

## 2017-12-02 DIAGNOSIS — I471 Supraventricular tachycardia: Secondary | ICD-10-CM | POA: Diagnosis not present

## 2017-12-02 DIAGNOSIS — I2510 Atherosclerotic heart disease of native coronary artery without angina pectoris: Secondary | ICD-10-CM | POA: Diagnosis not present

## 2017-12-02 DIAGNOSIS — I1311 Hypertensive heart and chronic kidney disease without heart failure, with stage 5 chronic kidney disease, or end stage renal disease: Secondary | ICD-10-CM | POA: Diagnosis not present

## 2017-12-03 DIAGNOSIS — I471 Supraventricular tachycardia: Secondary | ICD-10-CM | POA: Diagnosis not present

## 2017-12-03 DIAGNOSIS — I480 Paroxysmal atrial fibrillation: Secondary | ICD-10-CM | POA: Diagnosis not present

## 2017-12-03 DIAGNOSIS — D631 Anemia in chronic kidney disease: Secondary | ICD-10-CM | POA: Diagnosis not present

## 2017-12-03 DIAGNOSIS — I1311 Hypertensive heart and chronic kidney disease without heart failure, with stage 5 chronic kidney disease, or end stage renal disease: Secondary | ICD-10-CM | POA: Diagnosis not present

## 2017-12-03 DIAGNOSIS — N186 End stage renal disease: Secondary | ICD-10-CM | POA: Diagnosis not present

## 2017-12-03 DIAGNOSIS — I2510 Atherosclerotic heart disease of native coronary artery without angina pectoris: Secondary | ICD-10-CM | POA: Diagnosis not present

## 2017-12-06 DIAGNOSIS — I471 Supraventricular tachycardia: Secondary | ICD-10-CM | POA: Diagnosis not present

## 2017-12-06 DIAGNOSIS — D631 Anemia in chronic kidney disease: Secondary | ICD-10-CM | POA: Diagnosis not present

## 2017-12-06 DIAGNOSIS — I2510 Atherosclerotic heart disease of native coronary artery without angina pectoris: Secondary | ICD-10-CM | POA: Diagnosis not present

## 2017-12-06 DIAGNOSIS — I480 Paroxysmal atrial fibrillation: Secondary | ICD-10-CM | POA: Diagnosis not present

## 2017-12-06 DIAGNOSIS — N186 End stage renal disease: Secondary | ICD-10-CM | POA: Diagnosis not present

## 2017-12-06 DIAGNOSIS — I1311 Hypertensive heart and chronic kidney disease without heart failure, with stage 5 chronic kidney disease, or end stage renal disease: Secondary | ICD-10-CM | POA: Diagnosis not present

## 2017-12-07 DIAGNOSIS — I471 Supraventricular tachycardia: Secondary | ICD-10-CM | POA: Diagnosis not present

## 2017-12-07 DIAGNOSIS — I2510 Atherosclerotic heart disease of native coronary artery without angina pectoris: Secondary | ICD-10-CM | POA: Diagnosis not present

## 2017-12-07 DIAGNOSIS — I1311 Hypertensive heart and chronic kidney disease without heart failure, with stage 5 chronic kidney disease, or end stage renal disease: Secondary | ICD-10-CM | POA: Diagnosis not present

## 2017-12-07 DIAGNOSIS — D631 Anemia in chronic kidney disease: Secondary | ICD-10-CM | POA: Diagnosis not present

## 2017-12-07 DIAGNOSIS — N186 End stage renal disease: Secondary | ICD-10-CM | POA: Diagnosis not present

## 2017-12-07 DIAGNOSIS — I480 Paroxysmal atrial fibrillation: Secondary | ICD-10-CM | POA: Diagnosis not present

## 2017-12-08 DIAGNOSIS — D631 Anemia in chronic kidney disease: Secondary | ICD-10-CM | POA: Diagnosis not present

## 2017-12-08 DIAGNOSIS — N186 End stage renal disease: Secondary | ICD-10-CM | POA: Diagnosis not present

## 2017-12-08 DIAGNOSIS — I471 Supraventricular tachycardia: Secondary | ICD-10-CM | POA: Diagnosis not present

## 2017-12-08 DIAGNOSIS — I2510 Atherosclerotic heart disease of native coronary artery without angina pectoris: Secondary | ICD-10-CM | POA: Diagnosis not present

## 2017-12-08 DIAGNOSIS — I480 Paroxysmal atrial fibrillation: Secondary | ICD-10-CM | POA: Diagnosis not present

## 2017-12-08 DIAGNOSIS — I1311 Hypertensive heart and chronic kidney disease without heart failure, with stage 5 chronic kidney disease, or end stage renal disease: Secondary | ICD-10-CM | POA: Diagnosis not present

## 2017-12-09 DIAGNOSIS — D631 Anemia in chronic kidney disease: Secondary | ICD-10-CM | POA: Diagnosis not present

## 2017-12-09 DIAGNOSIS — I480 Paroxysmal atrial fibrillation: Secondary | ICD-10-CM | POA: Diagnosis not present

## 2017-12-09 DIAGNOSIS — I2510 Atherosclerotic heart disease of native coronary artery without angina pectoris: Secondary | ICD-10-CM | POA: Diagnosis not present

## 2017-12-09 DIAGNOSIS — N186 End stage renal disease: Secondary | ICD-10-CM | POA: Diagnosis not present

## 2017-12-09 DIAGNOSIS — I1311 Hypertensive heart and chronic kidney disease without heart failure, with stage 5 chronic kidney disease, or end stage renal disease: Secondary | ICD-10-CM | POA: Diagnosis not present

## 2017-12-09 DIAGNOSIS — I471 Supraventricular tachycardia: Secondary | ICD-10-CM | POA: Diagnosis not present

## 2017-12-10 DIAGNOSIS — I2510 Atherosclerotic heart disease of native coronary artery without angina pectoris: Secondary | ICD-10-CM | POA: Diagnosis not present

## 2017-12-10 DIAGNOSIS — I480 Paroxysmal atrial fibrillation: Secondary | ICD-10-CM | POA: Diagnosis not present

## 2017-12-10 DIAGNOSIS — I471 Supraventricular tachycardia: Secondary | ICD-10-CM | POA: Diagnosis not present

## 2017-12-10 DIAGNOSIS — D631 Anemia in chronic kidney disease: Secondary | ICD-10-CM | POA: Diagnosis not present

## 2017-12-10 DIAGNOSIS — I1311 Hypertensive heart and chronic kidney disease without heart failure, with stage 5 chronic kidney disease, or end stage renal disease: Secondary | ICD-10-CM | POA: Diagnosis not present

## 2017-12-10 DIAGNOSIS — N186 End stage renal disease: Secondary | ICD-10-CM | POA: Diagnosis not present

## 2017-12-13 DIAGNOSIS — I471 Supraventricular tachycardia: Secondary | ICD-10-CM | POA: Diagnosis not present

## 2017-12-13 DIAGNOSIS — N186 End stage renal disease: Secondary | ICD-10-CM | POA: Diagnosis not present

## 2017-12-13 DIAGNOSIS — D631 Anemia in chronic kidney disease: Secondary | ICD-10-CM | POA: Diagnosis not present

## 2017-12-13 DIAGNOSIS — I1311 Hypertensive heart and chronic kidney disease without heart failure, with stage 5 chronic kidney disease, or end stage renal disease: Secondary | ICD-10-CM | POA: Diagnosis not present

## 2017-12-13 DIAGNOSIS — I480 Paroxysmal atrial fibrillation: Secondary | ICD-10-CM | POA: Diagnosis not present

## 2017-12-13 DIAGNOSIS — I2510 Atherosclerotic heart disease of native coronary artery without angina pectoris: Secondary | ICD-10-CM | POA: Diagnosis not present

## 2017-12-14 DIAGNOSIS — I2510 Atherosclerotic heart disease of native coronary artery without angina pectoris: Secondary | ICD-10-CM | POA: Diagnosis not present

## 2017-12-14 DIAGNOSIS — D631 Anemia in chronic kidney disease: Secondary | ICD-10-CM | POA: Diagnosis not present

## 2017-12-14 DIAGNOSIS — I471 Supraventricular tachycardia: Secondary | ICD-10-CM | POA: Diagnosis not present

## 2017-12-14 DIAGNOSIS — I1311 Hypertensive heart and chronic kidney disease without heart failure, with stage 5 chronic kidney disease, or end stage renal disease: Secondary | ICD-10-CM | POA: Diagnosis not present

## 2017-12-14 DIAGNOSIS — N186 End stage renal disease: Secondary | ICD-10-CM | POA: Diagnosis not present

## 2017-12-14 DIAGNOSIS — I480 Paroxysmal atrial fibrillation: Secondary | ICD-10-CM | POA: Diagnosis not present

## 2017-12-15 DIAGNOSIS — I480 Paroxysmal atrial fibrillation: Secondary | ICD-10-CM | POA: Diagnosis not present

## 2017-12-15 DIAGNOSIS — I2510 Atherosclerotic heart disease of native coronary artery without angina pectoris: Secondary | ICD-10-CM | POA: Diagnosis not present

## 2017-12-15 DIAGNOSIS — I471 Supraventricular tachycardia: Secondary | ICD-10-CM | POA: Diagnosis not present

## 2017-12-15 DIAGNOSIS — I1311 Hypertensive heart and chronic kidney disease without heart failure, with stage 5 chronic kidney disease, or end stage renal disease: Secondary | ICD-10-CM | POA: Diagnosis not present

## 2017-12-15 DIAGNOSIS — N186 End stage renal disease: Secondary | ICD-10-CM | POA: Diagnosis not present

## 2017-12-15 DIAGNOSIS — D631 Anemia in chronic kidney disease: Secondary | ICD-10-CM | POA: Diagnosis not present

## 2017-12-16 DIAGNOSIS — D631 Anemia in chronic kidney disease: Secondary | ICD-10-CM | POA: Diagnosis not present

## 2017-12-16 DIAGNOSIS — N186 End stage renal disease: Secondary | ICD-10-CM | POA: Diagnosis not present

## 2017-12-16 DIAGNOSIS — I480 Paroxysmal atrial fibrillation: Secondary | ICD-10-CM | POA: Diagnosis not present

## 2017-12-16 DIAGNOSIS — I2510 Atherosclerotic heart disease of native coronary artery without angina pectoris: Secondary | ICD-10-CM | POA: Diagnosis not present

## 2017-12-16 DIAGNOSIS — I1311 Hypertensive heart and chronic kidney disease without heart failure, with stage 5 chronic kidney disease, or end stage renal disease: Secondary | ICD-10-CM | POA: Diagnosis not present

## 2017-12-16 DIAGNOSIS — I471 Supraventricular tachycardia: Secondary | ICD-10-CM | POA: Diagnosis not present

## 2017-12-17 DIAGNOSIS — I480 Paroxysmal atrial fibrillation: Secondary | ICD-10-CM | POA: Diagnosis not present

## 2017-12-17 DIAGNOSIS — I471 Supraventricular tachycardia: Secondary | ICD-10-CM | POA: Diagnosis not present

## 2017-12-17 DIAGNOSIS — D631 Anemia in chronic kidney disease: Secondary | ICD-10-CM | POA: Diagnosis not present

## 2017-12-17 DIAGNOSIS — I2510 Atherosclerotic heart disease of native coronary artery without angina pectoris: Secondary | ICD-10-CM | POA: Diagnosis not present

## 2017-12-17 DIAGNOSIS — I1311 Hypertensive heart and chronic kidney disease without heart failure, with stage 5 chronic kidney disease, or end stage renal disease: Secondary | ICD-10-CM | POA: Diagnosis not present

## 2017-12-17 DIAGNOSIS — N186 End stage renal disease: Secondary | ICD-10-CM | POA: Diagnosis not present

## 2017-12-20 DIAGNOSIS — I2510 Atherosclerotic heart disease of native coronary artery without angina pectoris: Secondary | ICD-10-CM | POA: Diagnosis not present

## 2017-12-20 DIAGNOSIS — R627 Adult failure to thrive: Secondary | ICD-10-CM | POA: Diagnosis not present

## 2017-12-20 DIAGNOSIS — I480 Paroxysmal atrial fibrillation: Secondary | ICD-10-CM | POA: Diagnosis not present

## 2017-12-20 DIAGNOSIS — Z9981 Dependence on supplemental oxygen: Secondary | ICD-10-CM | POA: Diagnosis not present

## 2017-12-20 DIAGNOSIS — E785 Hyperlipidemia, unspecified: Secondary | ICD-10-CM | POA: Diagnosis not present

## 2017-12-20 DIAGNOSIS — E559 Vitamin D deficiency, unspecified: Secondary | ICD-10-CM | POA: Diagnosis not present

## 2017-12-20 DIAGNOSIS — I471 Supraventricular tachycardia: Secondary | ICD-10-CM | POA: Diagnosis not present

## 2017-12-20 DIAGNOSIS — Z8781 Personal history of (healed) traumatic fracture: Secondary | ICD-10-CM | POA: Diagnosis not present

## 2017-12-20 DIAGNOSIS — Z9181 History of falling: Secondary | ICD-10-CM | POA: Diagnosis not present

## 2017-12-20 DIAGNOSIS — M109 Gout, unspecified: Secondary | ICD-10-CM | POA: Diagnosis not present

## 2017-12-20 DIAGNOSIS — Z7982 Long term (current) use of aspirin: Secondary | ICD-10-CM | POA: Diagnosis not present

## 2017-12-20 DIAGNOSIS — N186 End stage renal disease: Secondary | ICD-10-CM | POA: Diagnosis not present

## 2017-12-20 DIAGNOSIS — I1311 Hypertensive heart and chronic kidney disease without heart failure, with stage 5 chronic kidney disease, or end stage renal disease: Secondary | ICD-10-CM | POA: Diagnosis not present

## 2017-12-20 DIAGNOSIS — I081 Rheumatic disorders of both mitral and tricuspid valves: Secondary | ICD-10-CM | POA: Diagnosis not present

## 2017-12-20 DIAGNOSIS — E039 Hypothyroidism, unspecified: Secondary | ICD-10-CM | POA: Diagnosis not present

## 2017-12-20 DIAGNOSIS — Z6822 Body mass index (BMI) 22.0-22.9, adult: Secondary | ICD-10-CM | POA: Diagnosis not present

## 2017-12-20 DIAGNOSIS — D631 Anemia in chronic kidney disease: Secondary | ICD-10-CM | POA: Diagnosis not present

## 2017-12-20 DIAGNOSIS — F329 Major depressive disorder, single episode, unspecified: Secondary | ICD-10-CM | POA: Diagnosis not present

## 2017-12-20 DIAGNOSIS — R451 Restlessness and agitation: Secondary | ICD-10-CM | POA: Diagnosis not present

## 2017-12-21 DIAGNOSIS — I1311 Hypertensive heart and chronic kidney disease without heart failure, with stage 5 chronic kidney disease, or end stage renal disease: Secondary | ICD-10-CM | POA: Diagnosis not present

## 2017-12-21 DIAGNOSIS — D631 Anemia in chronic kidney disease: Secondary | ICD-10-CM | POA: Diagnosis not present

## 2017-12-21 DIAGNOSIS — N186 End stage renal disease: Secondary | ICD-10-CM | POA: Diagnosis not present

## 2017-12-21 DIAGNOSIS — I2510 Atherosclerotic heart disease of native coronary artery without angina pectoris: Secondary | ICD-10-CM | POA: Diagnosis not present

## 2017-12-21 DIAGNOSIS — I480 Paroxysmal atrial fibrillation: Secondary | ICD-10-CM | POA: Diagnosis not present

## 2017-12-21 DIAGNOSIS — I471 Supraventricular tachycardia: Secondary | ICD-10-CM | POA: Diagnosis not present

## 2017-12-22 DIAGNOSIS — D631 Anemia in chronic kidney disease: Secondary | ICD-10-CM | POA: Diagnosis not present

## 2017-12-22 DIAGNOSIS — I2510 Atherosclerotic heart disease of native coronary artery without angina pectoris: Secondary | ICD-10-CM | POA: Diagnosis not present

## 2017-12-22 DIAGNOSIS — N186 End stage renal disease: Secondary | ICD-10-CM | POA: Diagnosis not present

## 2017-12-22 DIAGNOSIS — I1311 Hypertensive heart and chronic kidney disease without heart failure, with stage 5 chronic kidney disease, or end stage renal disease: Secondary | ICD-10-CM | POA: Diagnosis not present

## 2017-12-22 DIAGNOSIS — I480 Paroxysmal atrial fibrillation: Secondary | ICD-10-CM | POA: Diagnosis not present

## 2017-12-22 DIAGNOSIS — I471 Supraventricular tachycardia: Secondary | ICD-10-CM | POA: Diagnosis not present

## 2017-12-23 DIAGNOSIS — I1311 Hypertensive heart and chronic kidney disease without heart failure, with stage 5 chronic kidney disease, or end stage renal disease: Secondary | ICD-10-CM | POA: Diagnosis not present

## 2017-12-23 DIAGNOSIS — D631 Anemia in chronic kidney disease: Secondary | ICD-10-CM | POA: Diagnosis not present

## 2017-12-23 DIAGNOSIS — N186 End stage renal disease: Secondary | ICD-10-CM | POA: Diagnosis not present

## 2017-12-23 DIAGNOSIS — I480 Paroxysmal atrial fibrillation: Secondary | ICD-10-CM | POA: Diagnosis not present

## 2017-12-23 DIAGNOSIS — I471 Supraventricular tachycardia: Secondary | ICD-10-CM | POA: Diagnosis not present

## 2017-12-23 DIAGNOSIS — I2510 Atherosclerotic heart disease of native coronary artery without angina pectoris: Secondary | ICD-10-CM | POA: Diagnosis not present

## 2017-12-23 IMAGING — CR DG RIBS W/ CHEST 3+V*R*
3 series · 3 of 3 positions shown · non-contrast
Comparison: Chest radiograph April 03, 2009

CLINICAL DATA: Pain following fall

EXAM:
RIGHT RIBS AND CHEST - 3+ VIEW

[chest pa]
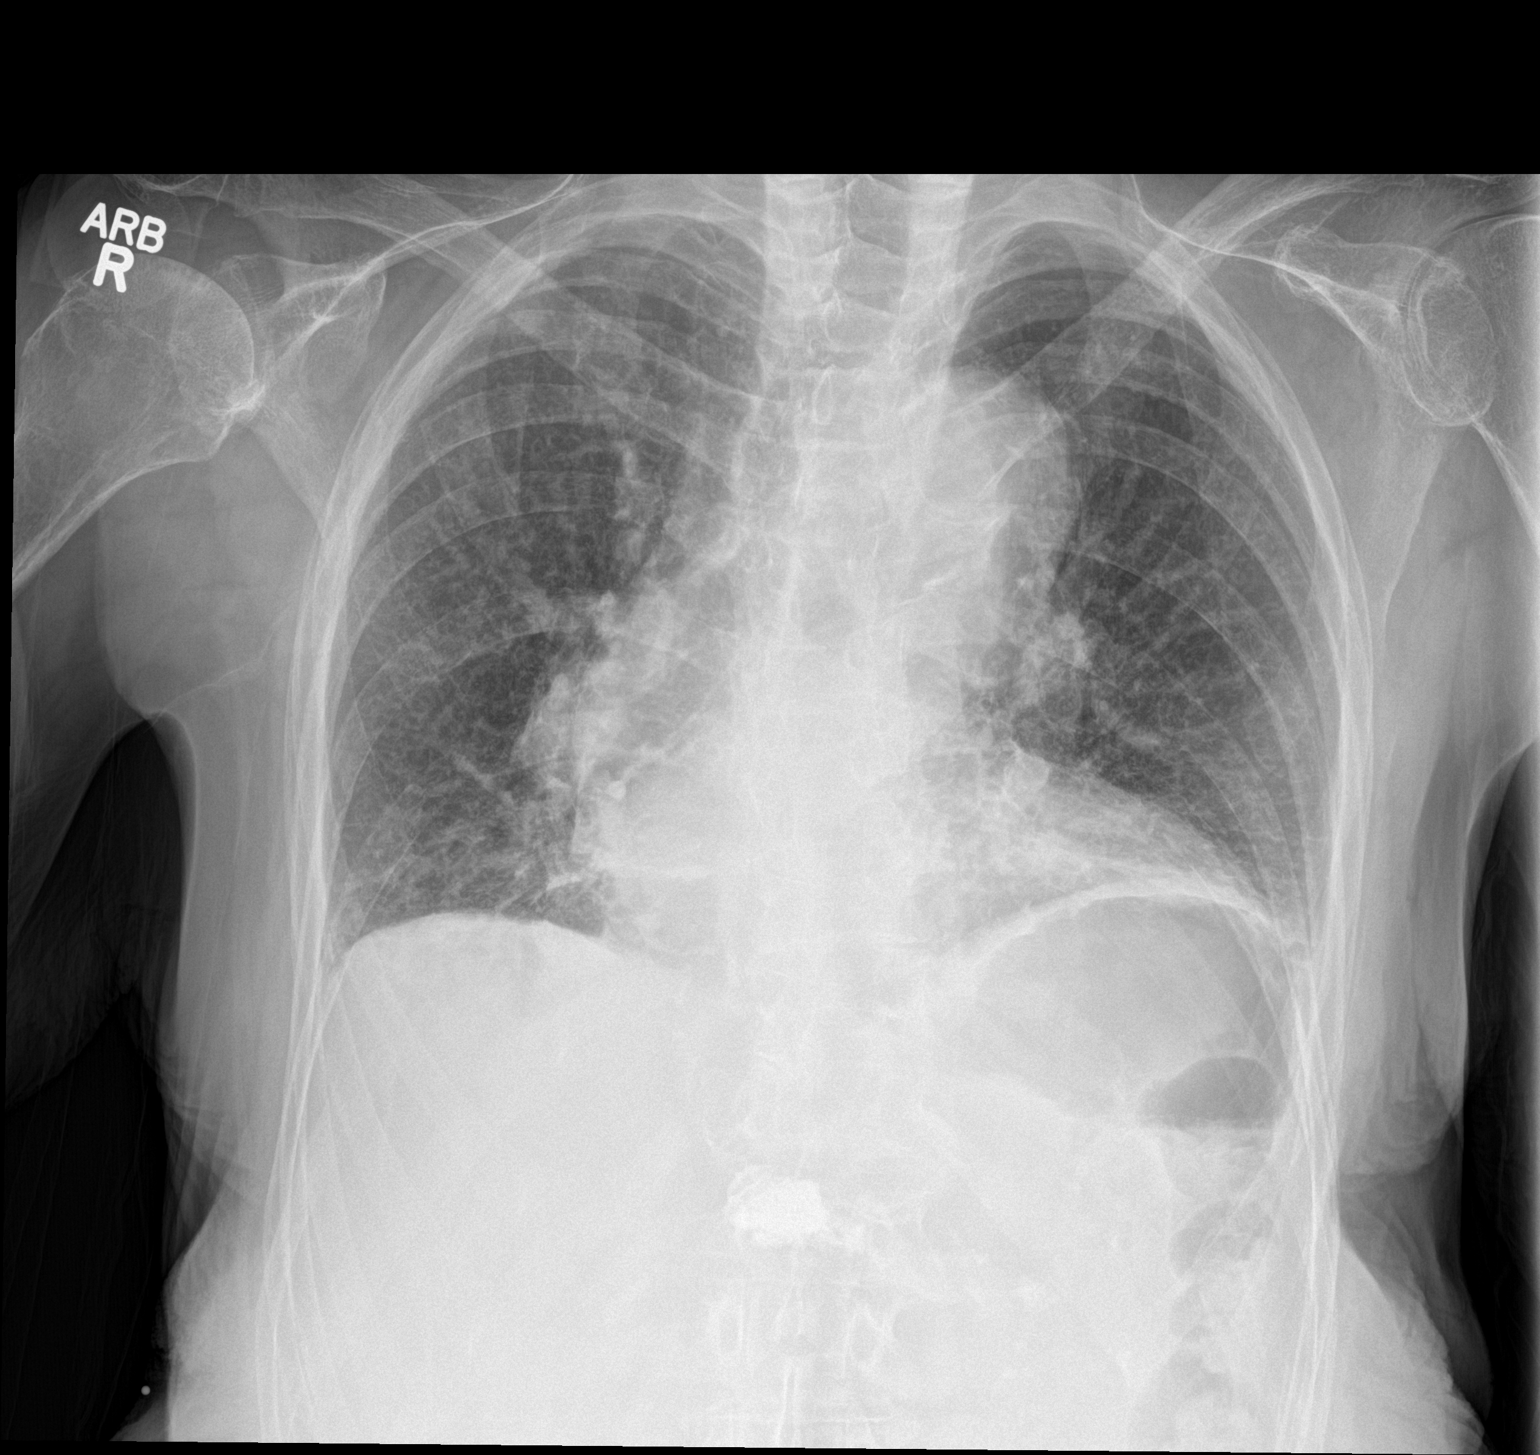

[rib pa]
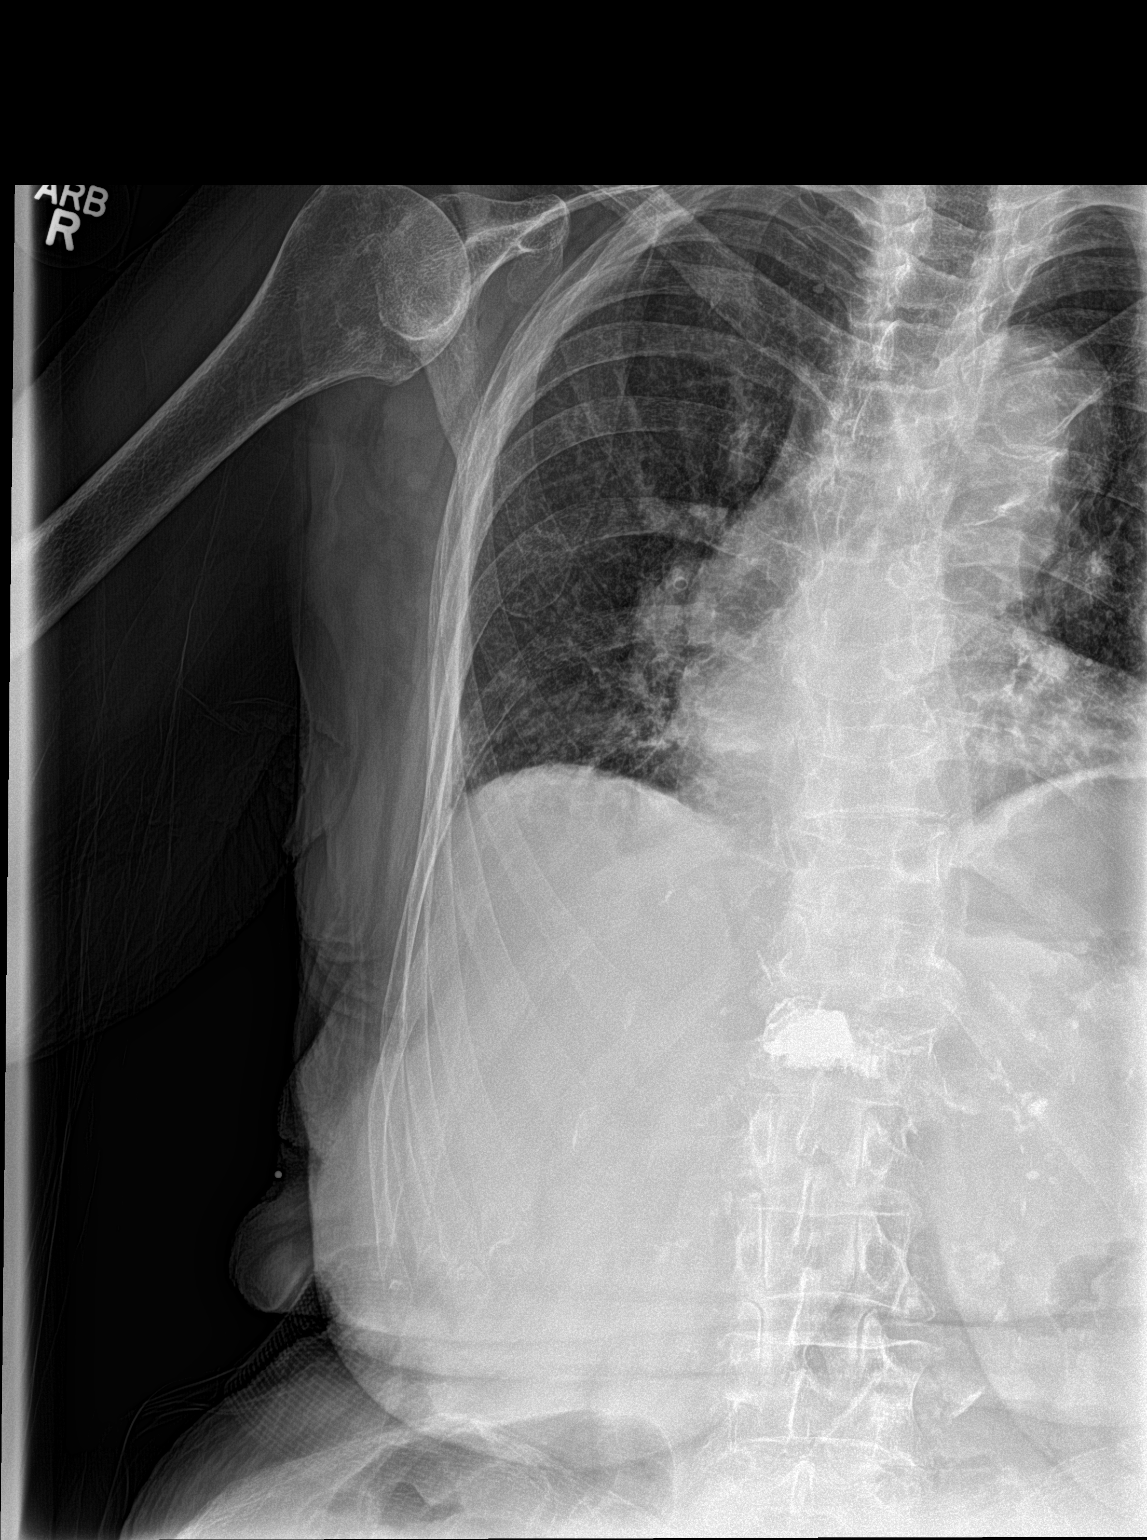

[rib ap obl]
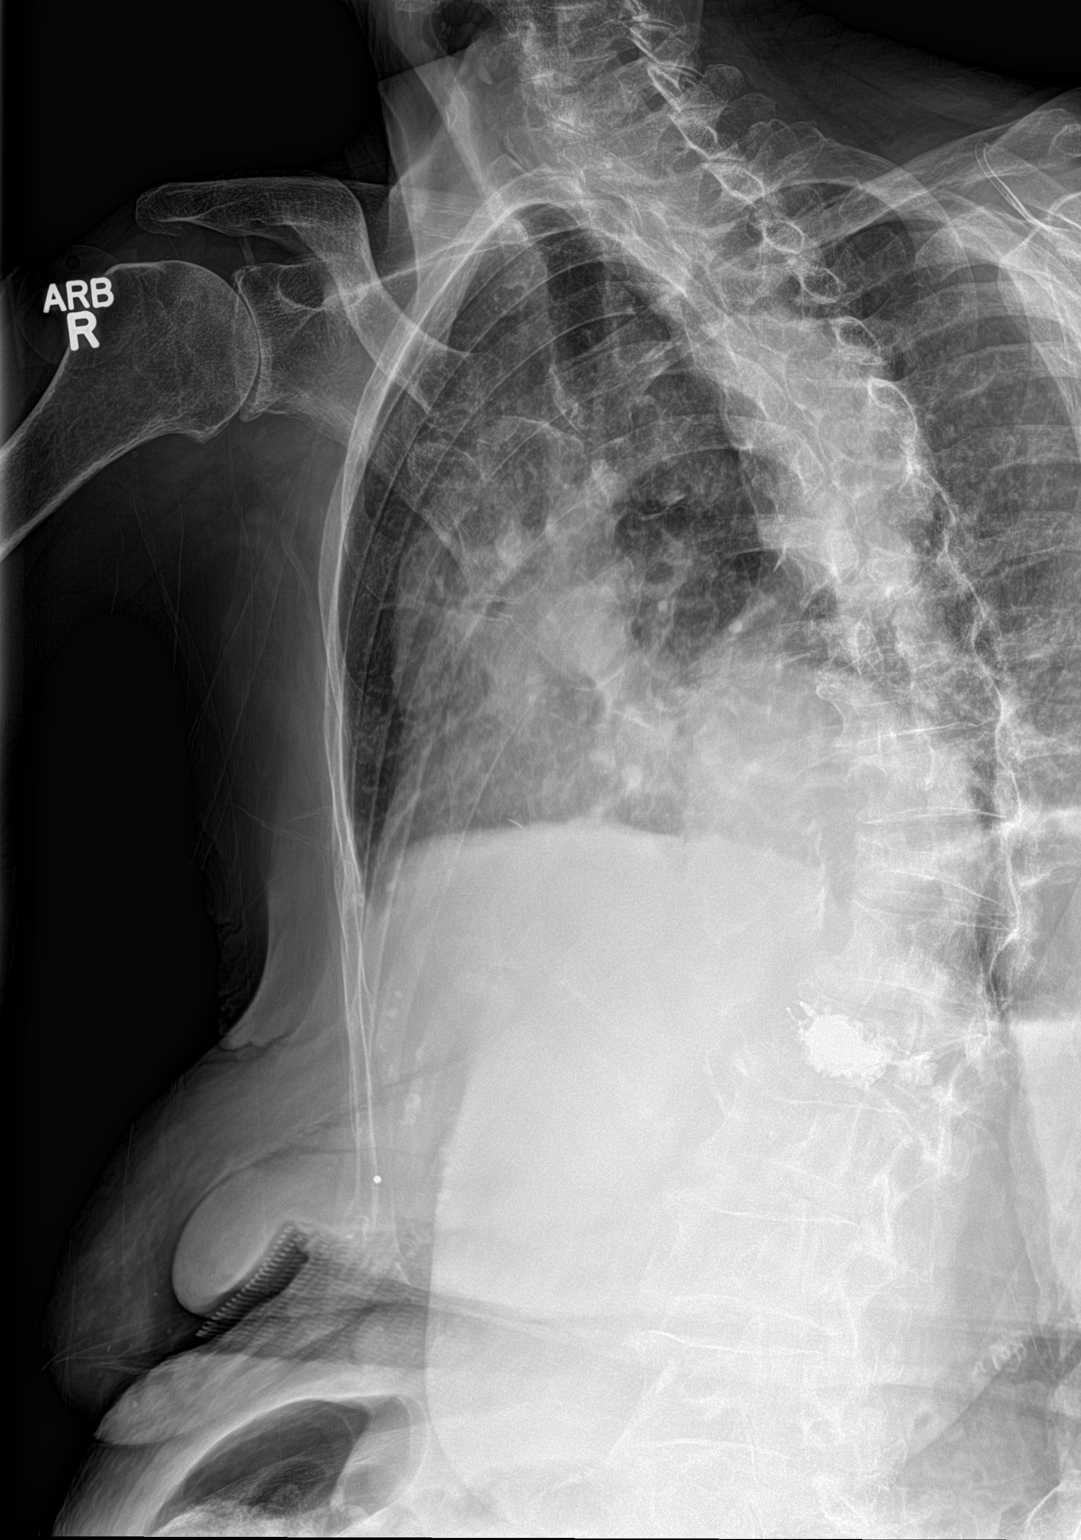

[3 of 3 positions shown; findings below may reference images not displayed]

FINDINGS: Frontal as well as oblique and cone-down lower rib images obtained.
There is interstitial edema with cardiomegaly and mild pulmonary
venous hypertension. There is atherosclerotic calcification in the
aorta. No adenopathy. No airspace consolidation.

Patient has had previous kyphoplasty procedure at L1. There is
anterior wedging of several lower thoracic vertebral bodies. There
is no pneumothorax or pleural effusion. There is evidence of an old
healed fracture of the right third rib laterally. No acute rib
fracture evident.
IMPRESSION: Old third rib fracture. No acute rib fracture is evident. Patient
has had previous kyphoplasty procedure at L1. There is anterior
wedging of several lower thoracic vertebral bodies, age uncertain.
No pneumothorax or pleural effusion. Evidence a degree of underlying
congestive heart failure. There is aortic atherosclerosis.

## 2017-12-23 IMAGING — CT CT HEAD W/O CM
3 series · 15 of 46 positions shown, 18 images · non-contrast
Comparison: None.

CLINICAL DATA: Fall, dizziness, facial injury

EXAM:
CT HEAD WITHOUT CONTRAST
TECHNIQUE: Contiguous axial images were obtained from the base of the skull
through the vertex without intravenous contrast.

[Series 2: head wo · axial · 0.47mm/px · z∈[+501,+621]mm · 9 of 29 slices shown, 12 images]
[im 3/29  brain]
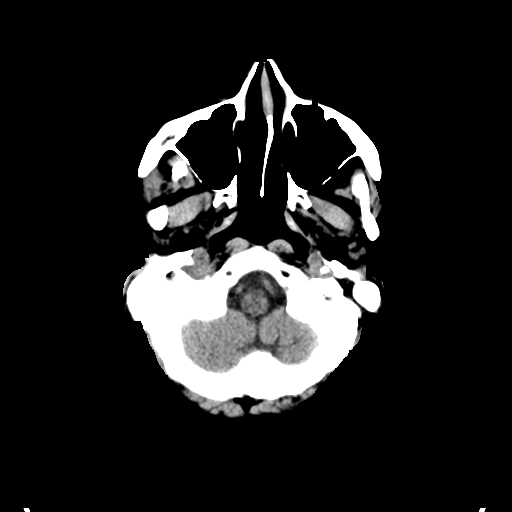
[im 3/29  bone]
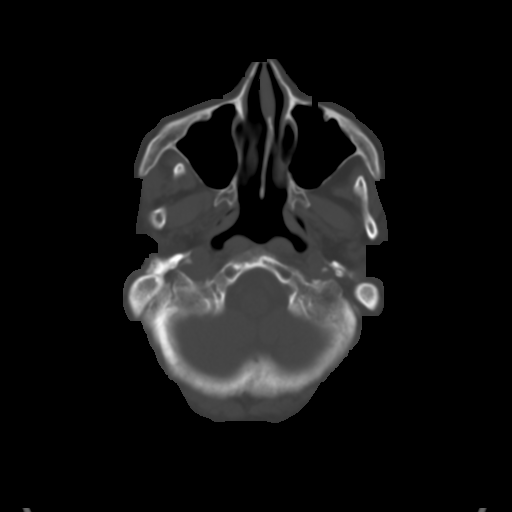
[im 6/29  brain]
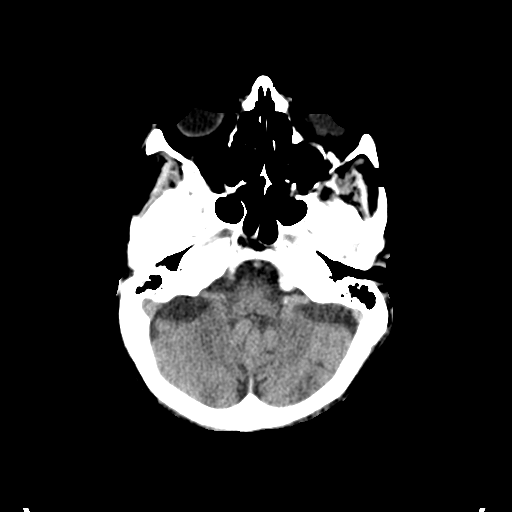
[im 9/29  brain]
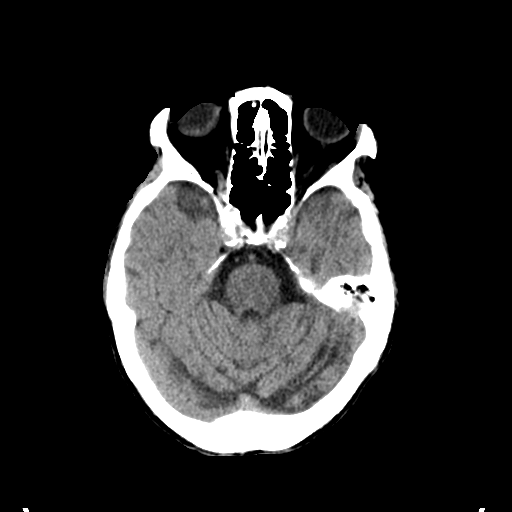
[im 12/29  brain]
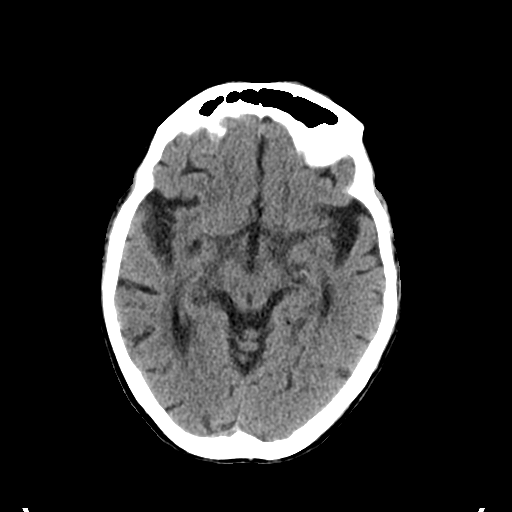
[im 15/29  brain]
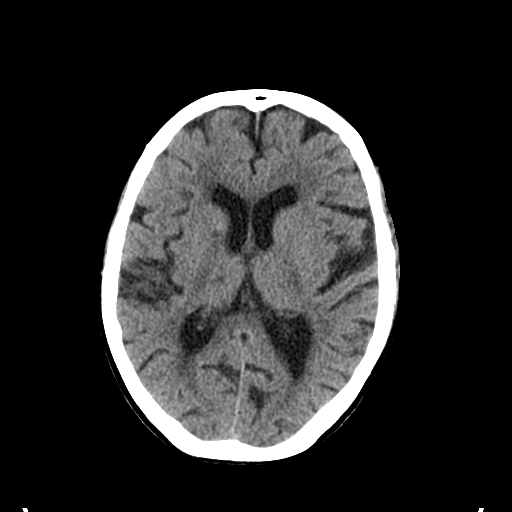
[im 15/29  bone]
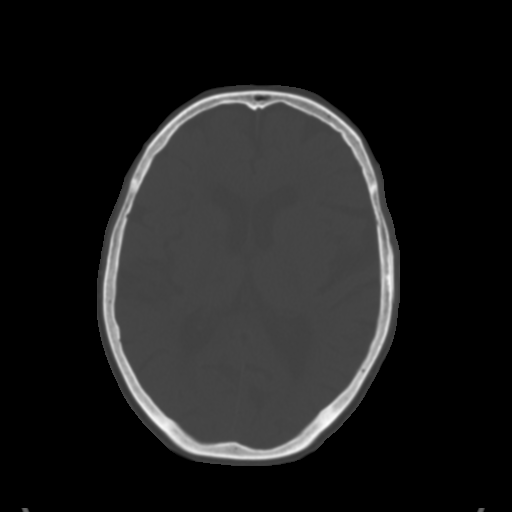
[im 18/29  brain]
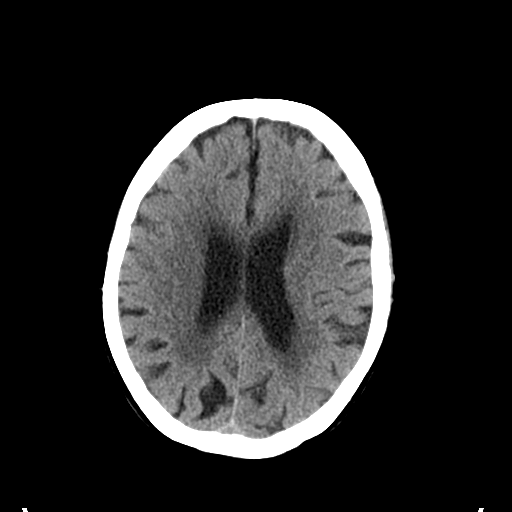
[im 21/29  brain]
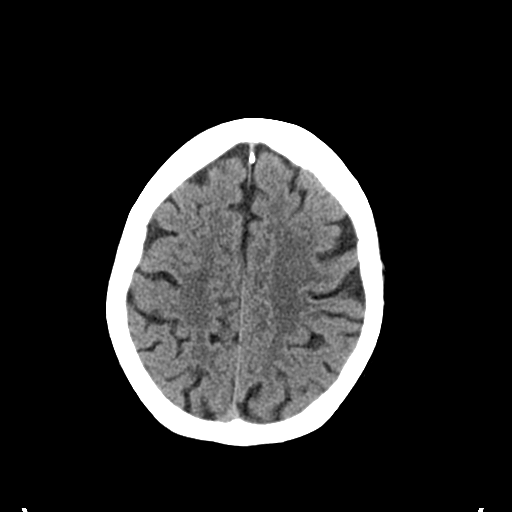
[im 24/29  brain]
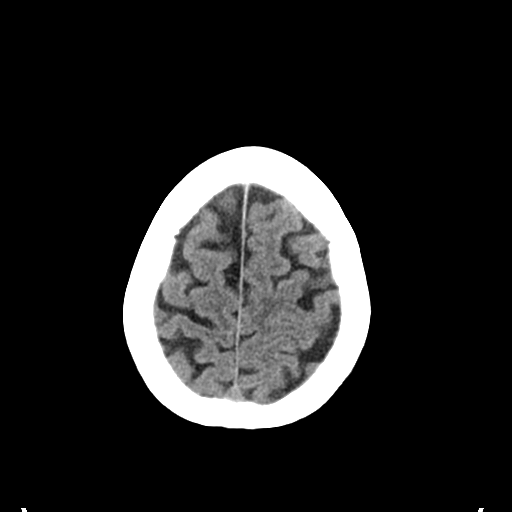
[im 27/29  brain]
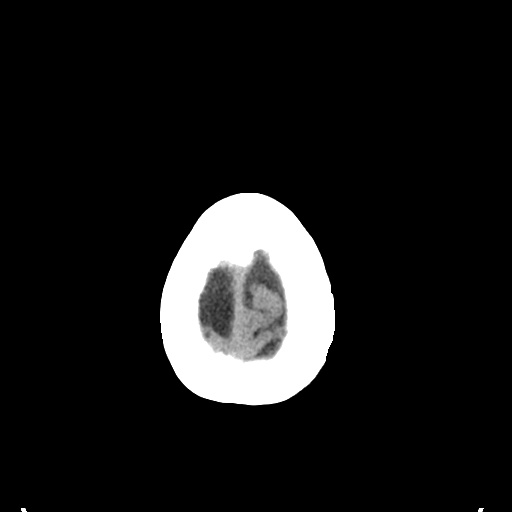
[im 27/29  bone]
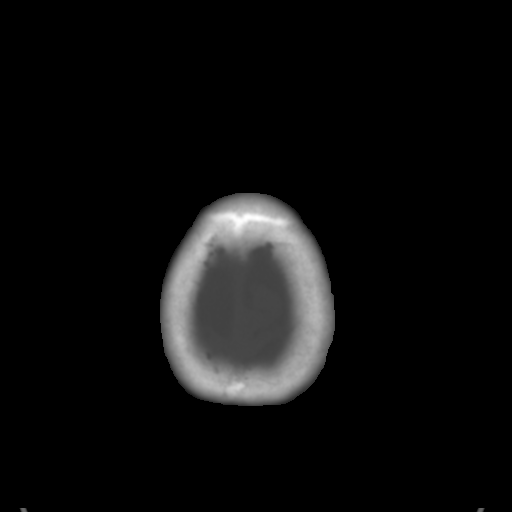

[Series 4: coronal soft tissue · coronal · 0.27mm/px · 3 of 64 slices shown]
[im 22/64  brain]
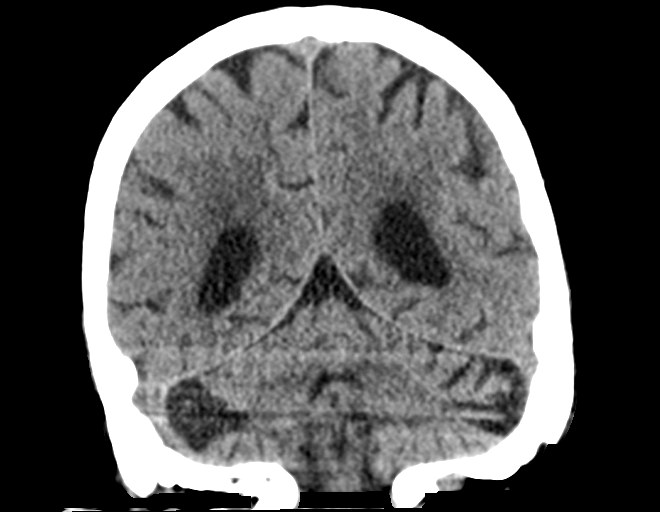
[im 29/64  brain]
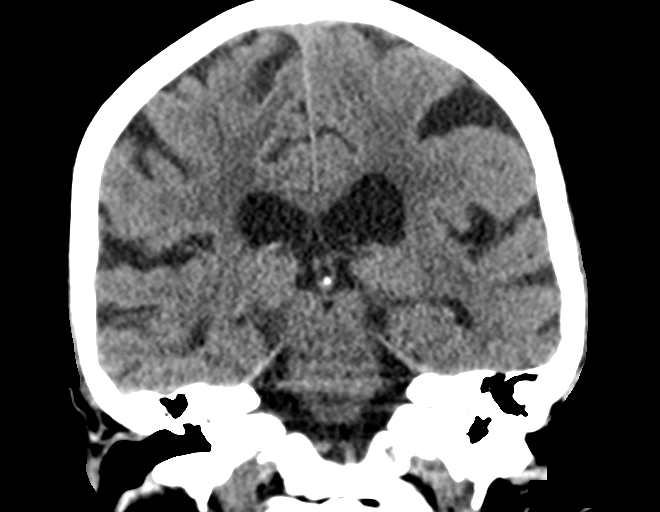
[im 36/64  brain]
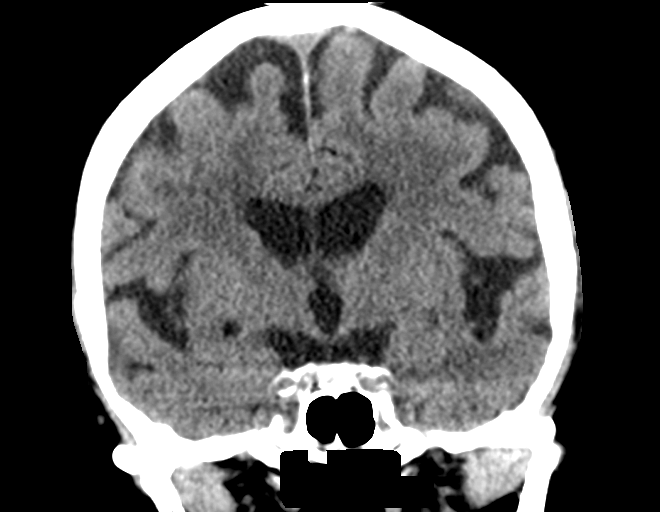

[Series 5: sagittal soft tissue · sagittal · 0.29mm/px · 3 of 49 slices shown]
[im 17/49  brain]
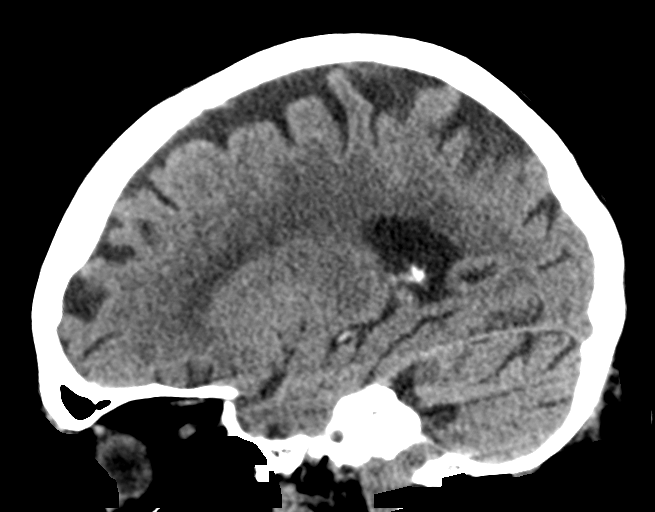
[im 25/49  brain]
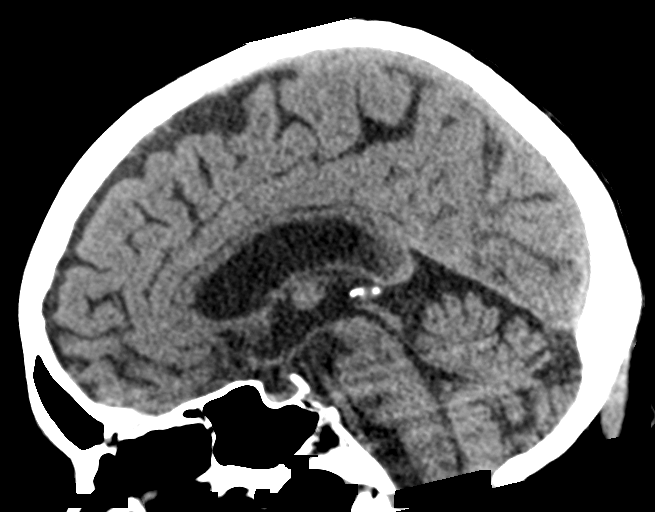
[im 33/49  brain]
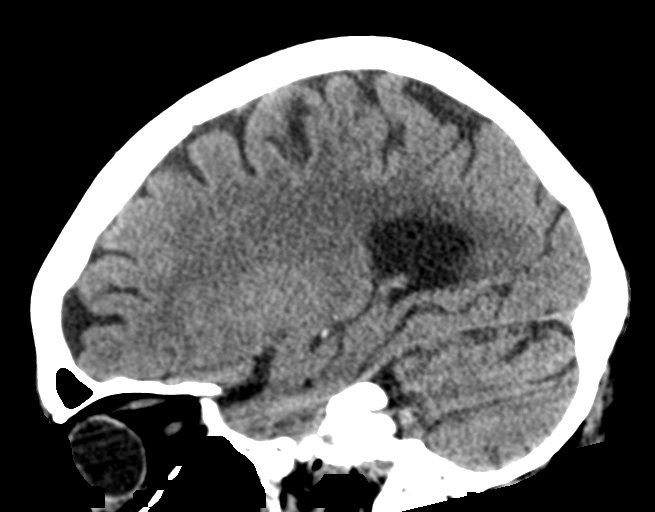

[15 of 46 positions shown; findings below may reference images not displayed]

FINDINGS: Brain: No evidence of acute infarction, hemorrhage, hydrocephalus,
extra-axial collection or mass lesion/mass effect.

Vascular: No hyperdense vessel or unexpected calcification.

Skull: No evidence of calvarial fracture.

Sinuses/Orbits: The visualized paranasal sinuses are essentially
clear. The mastoid air cells are unopacified.

Other: Global cortical atrophy.  No ventriculomegaly.

Subcortical white matter and periventricular small vessel ischemic
changes. Intracranial atherosclerosis.
IMPRESSION: No evidence of acute intracranial abnormality.

Atrophy with small vessel ischemic changes.

## 2017-12-24 DIAGNOSIS — I2510 Atherosclerotic heart disease of native coronary artery without angina pectoris: Secondary | ICD-10-CM | POA: Diagnosis not present

## 2017-12-24 DIAGNOSIS — I480 Paroxysmal atrial fibrillation: Secondary | ICD-10-CM | POA: Diagnosis not present

## 2017-12-24 DIAGNOSIS — D631 Anemia in chronic kidney disease: Secondary | ICD-10-CM | POA: Diagnosis not present

## 2017-12-24 DIAGNOSIS — I471 Supraventricular tachycardia: Secondary | ICD-10-CM | POA: Diagnosis not present

## 2017-12-24 DIAGNOSIS — I1311 Hypertensive heart and chronic kidney disease without heart failure, with stage 5 chronic kidney disease, or end stage renal disease: Secondary | ICD-10-CM | POA: Diagnosis not present

## 2017-12-24 DIAGNOSIS — N186 End stage renal disease: Secondary | ICD-10-CM | POA: Diagnosis not present

## 2017-12-27 DIAGNOSIS — I2510 Atherosclerotic heart disease of native coronary artery without angina pectoris: Secondary | ICD-10-CM | POA: Diagnosis not present

## 2017-12-27 DIAGNOSIS — I471 Supraventricular tachycardia: Secondary | ICD-10-CM | POA: Diagnosis not present

## 2017-12-27 DIAGNOSIS — D631 Anemia in chronic kidney disease: Secondary | ICD-10-CM | POA: Diagnosis not present

## 2017-12-27 DIAGNOSIS — I480 Paroxysmal atrial fibrillation: Secondary | ICD-10-CM | POA: Diagnosis not present

## 2017-12-27 DIAGNOSIS — I1311 Hypertensive heart and chronic kidney disease without heart failure, with stage 5 chronic kidney disease, or end stage renal disease: Secondary | ICD-10-CM | POA: Diagnosis not present

## 2017-12-27 DIAGNOSIS — N186 End stage renal disease: Secondary | ICD-10-CM | POA: Diagnosis not present

## 2017-12-28 DIAGNOSIS — I471 Supraventricular tachycardia: Secondary | ICD-10-CM | POA: Diagnosis not present

## 2017-12-28 DIAGNOSIS — I2510 Atherosclerotic heart disease of native coronary artery without angina pectoris: Secondary | ICD-10-CM | POA: Diagnosis not present

## 2017-12-28 DIAGNOSIS — N186 End stage renal disease: Secondary | ICD-10-CM | POA: Diagnosis not present

## 2017-12-28 DIAGNOSIS — D631 Anemia in chronic kidney disease: Secondary | ICD-10-CM | POA: Diagnosis not present

## 2017-12-28 DIAGNOSIS — I1311 Hypertensive heart and chronic kidney disease without heart failure, with stage 5 chronic kidney disease, or end stage renal disease: Secondary | ICD-10-CM | POA: Diagnosis not present

## 2017-12-28 DIAGNOSIS — I480 Paroxysmal atrial fibrillation: Secondary | ICD-10-CM | POA: Diagnosis not present

## 2017-12-29 DIAGNOSIS — I471 Supraventricular tachycardia: Secondary | ICD-10-CM | POA: Diagnosis not present

## 2017-12-29 DIAGNOSIS — D631 Anemia in chronic kidney disease: Secondary | ICD-10-CM | POA: Diagnosis not present

## 2017-12-29 DIAGNOSIS — N186 End stage renal disease: Secondary | ICD-10-CM | POA: Diagnosis not present

## 2017-12-29 DIAGNOSIS — I1311 Hypertensive heart and chronic kidney disease without heart failure, with stage 5 chronic kidney disease, or end stage renal disease: Secondary | ICD-10-CM | POA: Diagnosis not present

## 2017-12-29 DIAGNOSIS — I480 Paroxysmal atrial fibrillation: Secondary | ICD-10-CM | POA: Diagnosis not present

## 2017-12-29 DIAGNOSIS — I2510 Atherosclerotic heart disease of native coronary artery without angina pectoris: Secondary | ICD-10-CM | POA: Diagnosis not present

## 2017-12-30 DIAGNOSIS — I2510 Atherosclerotic heart disease of native coronary artery without angina pectoris: Secondary | ICD-10-CM | POA: Diagnosis not present

## 2017-12-30 DIAGNOSIS — N186 End stage renal disease: Secondary | ICD-10-CM | POA: Diagnosis not present

## 2017-12-30 DIAGNOSIS — D631 Anemia in chronic kidney disease: Secondary | ICD-10-CM | POA: Diagnosis not present

## 2017-12-30 DIAGNOSIS — I471 Supraventricular tachycardia: Secondary | ICD-10-CM | POA: Diagnosis not present

## 2017-12-30 DIAGNOSIS — I1311 Hypertensive heart and chronic kidney disease without heart failure, with stage 5 chronic kidney disease, or end stage renal disease: Secondary | ICD-10-CM | POA: Diagnosis not present

## 2017-12-30 DIAGNOSIS — I480 Paroxysmal atrial fibrillation: Secondary | ICD-10-CM | POA: Diagnosis not present

## 2017-12-31 DIAGNOSIS — I2510 Atherosclerotic heart disease of native coronary artery without angina pectoris: Secondary | ICD-10-CM | POA: Diagnosis not present

## 2017-12-31 DIAGNOSIS — I1311 Hypertensive heart and chronic kidney disease without heart failure, with stage 5 chronic kidney disease, or end stage renal disease: Secondary | ICD-10-CM | POA: Diagnosis not present

## 2017-12-31 DIAGNOSIS — I471 Supraventricular tachycardia: Secondary | ICD-10-CM | POA: Diagnosis not present

## 2017-12-31 DIAGNOSIS — N186 End stage renal disease: Secondary | ICD-10-CM | POA: Diagnosis not present

## 2017-12-31 DIAGNOSIS — I480 Paroxysmal atrial fibrillation: Secondary | ICD-10-CM | POA: Diagnosis not present

## 2017-12-31 DIAGNOSIS — D631 Anemia in chronic kidney disease: Secondary | ICD-10-CM | POA: Diagnosis not present

## 2018-01-03 DIAGNOSIS — D631 Anemia in chronic kidney disease: Secondary | ICD-10-CM | POA: Diagnosis not present

## 2018-01-03 DIAGNOSIS — I480 Paroxysmal atrial fibrillation: Secondary | ICD-10-CM | POA: Diagnosis not present

## 2018-01-03 DIAGNOSIS — I471 Supraventricular tachycardia: Secondary | ICD-10-CM | POA: Diagnosis not present

## 2018-01-03 DIAGNOSIS — N186 End stage renal disease: Secondary | ICD-10-CM | POA: Diagnosis not present

## 2018-01-03 DIAGNOSIS — I1311 Hypertensive heart and chronic kidney disease without heart failure, with stage 5 chronic kidney disease, or end stage renal disease: Secondary | ICD-10-CM | POA: Diagnosis not present

## 2018-01-03 DIAGNOSIS — I2510 Atherosclerotic heart disease of native coronary artery without angina pectoris: Secondary | ICD-10-CM | POA: Diagnosis not present

## 2018-01-04 DIAGNOSIS — I480 Paroxysmal atrial fibrillation: Secondary | ICD-10-CM | POA: Diagnosis not present

## 2018-01-04 DIAGNOSIS — I1311 Hypertensive heart and chronic kidney disease without heart failure, with stage 5 chronic kidney disease, or end stage renal disease: Secondary | ICD-10-CM | POA: Diagnosis not present

## 2018-01-04 DIAGNOSIS — D631 Anemia in chronic kidney disease: Secondary | ICD-10-CM | POA: Diagnosis not present

## 2018-01-04 DIAGNOSIS — N186 End stage renal disease: Secondary | ICD-10-CM | POA: Diagnosis not present

## 2018-01-04 DIAGNOSIS — I471 Supraventricular tachycardia: Secondary | ICD-10-CM | POA: Diagnosis not present

## 2018-01-04 DIAGNOSIS — I2510 Atherosclerotic heart disease of native coronary artery without angina pectoris: Secondary | ICD-10-CM | POA: Diagnosis not present

## 2018-01-05 DIAGNOSIS — D631 Anemia in chronic kidney disease: Secondary | ICD-10-CM | POA: Diagnosis not present

## 2018-01-05 DIAGNOSIS — N186 End stage renal disease: Secondary | ICD-10-CM | POA: Diagnosis not present

## 2018-01-05 DIAGNOSIS — I480 Paroxysmal atrial fibrillation: Secondary | ICD-10-CM | POA: Diagnosis not present

## 2018-01-05 DIAGNOSIS — I2510 Atherosclerotic heart disease of native coronary artery without angina pectoris: Secondary | ICD-10-CM | POA: Diagnosis not present

## 2018-01-05 DIAGNOSIS — I1311 Hypertensive heart and chronic kidney disease without heart failure, with stage 5 chronic kidney disease, or end stage renal disease: Secondary | ICD-10-CM | POA: Diagnosis not present

## 2018-01-05 DIAGNOSIS — I471 Supraventricular tachycardia: Secondary | ICD-10-CM | POA: Diagnosis not present

## 2018-01-06 DIAGNOSIS — I480 Paroxysmal atrial fibrillation: Secondary | ICD-10-CM | POA: Diagnosis not present

## 2018-01-06 DIAGNOSIS — I471 Supraventricular tachycardia: Secondary | ICD-10-CM | POA: Diagnosis not present

## 2018-01-06 DIAGNOSIS — I1311 Hypertensive heart and chronic kidney disease without heart failure, with stage 5 chronic kidney disease, or end stage renal disease: Secondary | ICD-10-CM | POA: Diagnosis not present

## 2018-01-06 DIAGNOSIS — N186 End stage renal disease: Secondary | ICD-10-CM | POA: Diagnosis not present

## 2018-01-06 DIAGNOSIS — I2510 Atherosclerotic heart disease of native coronary artery without angina pectoris: Secondary | ICD-10-CM | POA: Diagnosis not present

## 2018-01-06 DIAGNOSIS — D631 Anemia in chronic kidney disease: Secondary | ICD-10-CM | POA: Diagnosis not present

## 2018-01-06 DIAGNOSIS — R4586 Emotional lability: Secondary | ICD-10-CM | POA: Diagnosis not present

## 2018-01-07 DIAGNOSIS — I1311 Hypertensive heart and chronic kidney disease without heart failure, with stage 5 chronic kidney disease, or end stage renal disease: Secondary | ICD-10-CM | POA: Diagnosis not present

## 2018-01-07 DIAGNOSIS — N186 End stage renal disease: Secondary | ICD-10-CM | POA: Diagnosis not present

## 2018-01-07 DIAGNOSIS — D631 Anemia in chronic kidney disease: Secondary | ICD-10-CM | POA: Diagnosis not present

## 2018-01-07 DIAGNOSIS — I471 Supraventricular tachycardia: Secondary | ICD-10-CM | POA: Diagnosis not present

## 2018-01-07 DIAGNOSIS — I480 Paroxysmal atrial fibrillation: Secondary | ICD-10-CM | POA: Diagnosis not present

## 2018-01-07 DIAGNOSIS — I2510 Atherosclerotic heart disease of native coronary artery without angina pectoris: Secondary | ICD-10-CM | POA: Diagnosis not present

## 2018-01-10 DIAGNOSIS — I471 Supraventricular tachycardia: Secondary | ICD-10-CM | POA: Diagnosis not present

## 2018-01-10 DIAGNOSIS — D631 Anemia in chronic kidney disease: Secondary | ICD-10-CM | POA: Diagnosis not present

## 2018-01-10 DIAGNOSIS — N186 End stage renal disease: Secondary | ICD-10-CM | POA: Diagnosis not present

## 2018-01-10 DIAGNOSIS — I480 Paroxysmal atrial fibrillation: Secondary | ICD-10-CM | POA: Diagnosis not present

## 2018-01-10 DIAGNOSIS — I2510 Atherosclerotic heart disease of native coronary artery without angina pectoris: Secondary | ICD-10-CM | POA: Diagnosis not present

## 2018-01-10 DIAGNOSIS — I1311 Hypertensive heart and chronic kidney disease without heart failure, with stage 5 chronic kidney disease, or end stage renal disease: Secondary | ICD-10-CM | POA: Diagnosis not present

## 2018-01-11 DIAGNOSIS — I2510 Atherosclerotic heart disease of native coronary artery without angina pectoris: Secondary | ICD-10-CM | POA: Diagnosis not present

## 2018-01-11 DIAGNOSIS — I480 Paroxysmal atrial fibrillation: Secondary | ICD-10-CM | POA: Diagnosis not present

## 2018-01-11 DIAGNOSIS — N186 End stage renal disease: Secondary | ICD-10-CM | POA: Diagnosis not present

## 2018-01-11 DIAGNOSIS — D631 Anemia in chronic kidney disease: Secondary | ICD-10-CM | POA: Diagnosis not present

## 2018-01-11 DIAGNOSIS — I471 Supraventricular tachycardia: Secondary | ICD-10-CM | POA: Diagnosis not present

## 2018-01-11 DIAGNOSIS — I1311 Hypertensive heart and chronic kidney disease without heart failure, with stage 5 chronic kidney disease, or end stage renal disease: Secondary | ICD-10-CM | POA: Diagnosis not present

## 2018-01-12 DIAGNOSIS — I2510 Atherosclerotic heart disease of native coronary artery without angina pectoris: Secondary | ICD-10-CM | POA: Diagnosis not present

## 2018-01-12 DIAGNOSIS — N186 End stage renal disease: Secondary | ICD-10-CM | POA: Diagnosis not present

## 2018-01-12 DIAGNOSIS — I1311 Hypertensive heart and chronic kidney disease without heart failure, with stage 5 chronic kidney disease, or end stage renal disease: Secondary | ICD-10-CM | POA: Diagnosis not present

## 2018-01-12 DIAGNOSIS — D631 Anemia in chronic kidney disease: Secondary | ICD-10-CM | POA: Diagnosis not present

## 2018-01-12 DIAGNOSIS — I480 Paroxysmal atrial fibrillation: Secondary | ICD-10-CM | POA: Diagnosis not present

## 2018-01-12 DIAGNOSIS — I471 Supraventricular tachycardia: Secondary | ICD-10-CM | POA: Diagnosis not present

## 2018-01-13 DIAGNOSIS — N186 End stage renal disease: Secondary | ICD-10-CM | POA: Diagnosis not present

## 2018-01-13 DIAGNOSIS — D631 Anemia in chronic kidney disease: Secondary | ICD-10-CM | POA: Diagnosis not present

## 2018-01-13 DIAGNOSIS — I480 Paroxysmal atrial fibrillation: Secondary | ICD-10-CM | POA: Diagnosis not present

## 2018-01-13 DIAGNOSIS — I471 Supraventricular tachycardia: Secondary | ICD-10-CM | POA: Diagnosis not present

## 2018-01-13 DIAGNOSIS — I2510 Atherosclerotic heart disease of native coronary artery without angina pectoris: Secondary | ICD-10-CM | POA: Diagnosis not present

## 2018-01-13 DIAGNOSIS — I1311 Hypertensive heart and chronic kidney disease without heart failure, with stage 5 chronic kidney disease, or end stage renal disease: Secondary | ICD-10-CM | POA: Diagnosis not present

## 2018-01-14 DIAGNOSIS — N186 End stage renal disease: Secondary | ICD-10-CM | POA: Diagnosis not present

## 2018-01-14 DIAGNOSIS — I2510 Atherosclerotic heart disease of native coronary artery without angina pectoris: Secondary | ICD-10-CM | POA: Diagnosis not present

## 2018-01-14 DIAGNOSIS — I471 Supraventricular tachycardia: Secondary | ICD-10-CM | POA: Diagnosis not present

## 2018-01-14 DIAGNOSIS — I480 Paroxysmal atrial fibrillation: Secondary | ICD-10-CM | POA: Diagnosis not present

## 2018-01-14 DIAGNOSIS — I1311 Hypertensive heart and chronic kidney disease without heart failure, with stage 5 chronic kidney disease, or end stage renal disease: Secondary | ICD-10-CM | POA: Diagnosis not present

## 2018-01-14 DIAGNOSIS — D631 Anemia in chronic kidney disease: Secondary | ICD-10-CM | POA: Diagnosis not present

## 2018-01-17 DIAGNOSIS — I1311 Hypertensive heart and chronic kidney disease without heart failure, with stage 5 chronic kidney disease, or end stage renal disease: Secondary | ICD-10-CM | POA: Diagnosis not present

## 2018-01-17 DIAGNOSIS — I480 Paroxysmal atrial fibrillation: Secondary | ICD-10-CM | POA: Diagnosis not present

## 2018-01-17 DIAGNOSIS — I2510 Atherosclerotic heart disease of native coronary artery without angina pectoris: Secondary | ICD-10-CM | POA: Diagnosis not present

## 2018-01-17 DIAGNOSIS — I471 Supraventricular tachycardia: Secondary | ICD-10-CM | POA: Diagnosis not present

## 2018-01-17 DIAGNOSIS — N186 End stage renal disease: Secondary | ICD-10-CM | POA: Diagnosis not present

## 2018-01-17 DIAGNOSIS — D631 Anemia in chronic kidney disease: Secondary | ICD-10-CM | POA: Diagnosis not present

## 2018-01-18 DIAGNOSIS — I1311 Hypertensive heart and chronic kidney disease without heart failure, with stage 5 chronic kidney disease, or end stage renal disease: Secondary | ICD-10-CM | POA: Diagnosis not present

## 2018-01-18 DIAGNOSIS — I471 Supraventricular tachycardia: Secondary | ICD-10-CM | POA: Diagnosis not present

## 2018-01-18 DIAGNOSIS — I2510 Atherosclerotic heart disease of native coronary artery without angina pectoris: Secondary | ICD-10-CM | POA: Diagnosis not present

## 2018-01-18 DIAGNOSIS — I480 Paroxysmal atrial fibrillation: Secondary | ICD-10-CM | POA: Diagnosis not present

## 2018-01-18 DIAGNOSIS — D631 Anemia in chronic kidney disease: Secondary | ICD-10-CM | POA: Diagnosis not present

## 2018-01-18 DIAGNOSIS — N186 End stage renal disease: Secondary | ICD-10-CM | POA: Diagnosis not present

## 2018-01-19 DIAGNOSIS — I480 Paroxysmal atrial fibrillation: Secondary | ICD-10-CM | POA: Diagnosis not present

## 2018-01-19 DIAGNOSIS — I1311 Hypertensive heart and chronic kidney disease without heart failure, with stage 5 chronic kidney disease, or end stage renal disease: Secondary | ICD-10-CM | POA: Diagnosis not present

## 2018-01-19 DIAGNOSIS — N186 End stage renal disease: Secondary | ICD-10-CM | POA: Diagnosis not present

## 2018-01-19 DIAGNOSIS — I471 Supraventricular tachycardia: Secondary | ICD-10-CM | POA: Diagnosis not present

## 2018-01-19 DIAGNOSIS — D631 Anemia in chronic kidney disease: Secondary | ICD-10-CM | POA: Diagnosis not present

## 2018-01-19 DIAGNOSIS — I2510 Atherosclerotic heart disease of native coronary artery without angina pectoris: Secondary | ICD-10-CM | POA: Diagnosis not present

## 2018-01-20 DIAGNOSIS — R451 Restlessness and agitation: Secondary | ICD-10-CM | POA: Diagnosis not present

## 2018-01-20 DIAGNOSIS — E785 Hyperlipidemia, unspecified: Secondary | ICD-10-CM | POA: Diagnosis not present

## 2018-01-20 DIAGNOSIS — N186 End stage renal disease: Secondary | ICD-10-CM | POA: Diagnosis not present

## 2018-01-20 DIAGNOSIS — I480 Paroxysmal atrial fibrillation: Secondary | ICD-10-CM | POA: Diagnosis not present

## 2018-01-20 DIAGNOSIS — I1311 Hypertensive heart and chronic kidney disease without heart failure, with stage 5 chronic kidney disease, or end stage renal disease: Secondary | ICD-10-CM | POA: Diagnosis not present

## 2018-01-20 DIAGNOSIS — D631 Anemia in chronic kidney disease: Secondary | ICD-10-CM | POA: Diagnosis not present

## 2018-01-20 DIAGNOSIS — E559 Vitamin D deficiency, unspecified: Secondary | ICD-10-CM | POA: Diagnosis not present

## 2018-01-20 DIAGNOSIS — M109 Gout, unspecified: Secondary | ICD-10-CM | POA: Diagnosis not present

## 2018-01-20 DIAGNOSIS — I2510 Atherosclerotic heart disease of native coronary artery without angina pectoris: Secondary | ICD-10-CM | POA: Diagnosis not present

## 2018-01-20 DIAGNOSIS — Z9981 Dependence on supplemental oxygen: Secondary | ICD-10-CM | POA: Diagnosis not present

## 2018-01-20 DIAGNOSIS — I471 Supraventricular tachycardia: Secondary | ICD-10-CM | POA: Diagnosis not present

## 2018-01-20 DIAGNOSIS — Z9181 History of falling: Secondary | ICD-10-CM | POA: Diagnosis not present

## 2018-01-20 DIAGNOSIS — Z6822 Body mass index (BMI) 22.0-22.9, adult: Secondary | ICD-10-CM | POA: Diagnosis not present

## 2018-01-20 DIAGNOSIS — Z8781 Personal history of (healed) traumatic fracture: Secondary | ICD-10-CM | POA: Diagnosis not present

## 2018-01-20 DIAGNOSIS — I081 Rheumatic disorders of both mitral and tricuspid valves: Secondary | ICD-10-CM | POA: Diagnosis not present

## 2018-01-20 DIAGNOSIS — R627 Adult failure to thrive: Secondary | ICD-10-CM | POA: Diagnosis not present

## 2018-01-20 DIAGNOSIS — Z7982 Long term (current) use of aspirin: Secondary | ICD-10-CM | POA: Diagnosis not present

## 2018-01-20 DIAGNOSIS — E039 Hypothyroidism, unspecified: Secondary | ICD-10-CM | POA: Diagnosis not present

## 2018-01-20 DIAGNOSIS — F329 Major depressive disorder, single episode, unspecified: Secondary | ICD-10-CM | POA: Diagnosis not present

## 2018-01-21 DIAGNOSIS — I1311 Hypertensive heart and chronic kidney disease without heart failure, with stage 5 chronic kidney disease, or end stage renal disease: Secondary | ICD-10-CM | POA: Diagnosis not present

## 2018-01-21 DIAGNOSIS — D631 Anemia in chronic kidney disease: Secondary | ICD-10-CM | POA: Diagnosis not present

## 2018-01-21 DIAGNOSIS — I2510 Atherosclerotic heart disease of native coronary artery without angina pectoris: Secondary | ICD-10-CM | POA: Diagnosis not present

## 2018-01-21 DIAGNOSIS — N186 End stage renal disease: Secondary | ICD-10-CM | POA: Diagnosis not present

## 2018-01-21 DIAGNOSIS — I480 Paroxysmal atrial fibrillation: Secondary | ICD-10-CM | POA: Diagnosis not present

## 2018-01-21 DIAGNOSIS — I471 Supraventricular tachycardia: Secondary | ICD-10-CM | POA: Diagnosis not present

## 2018-01-24 DIAGNOSIS — N186 End stage renal disease: Secondary | ICD-10-CM | POA: Diagnosis not present

## 2018-01-24 DIAGNOSIS — D631 Anemia in chronic kidney disease: Secondary | ICD-10-CM | POA: Diagnosis not present

## 2018-01-24 DIAGNOSIS — I480 Paroxysmal atrial fibrillation: Secondary | ICD-10-CM | POA: Diagnosis not present

## 2018-01-24 DIAGNOSIS — I1311 Hypertensive heart and chronic kidney disease without heart failure, with stage 5 chronic kidney disease, or end stage renal disease: Secondary | ICD-10-CM | POA: Diagnosis not present

## 2018-01-24 DIAGNOSIS — I2510 Atherosclerotic heart disease of native coronary artery without angina pectoris: Secondary | ICD-10-CM | POA: Diagnosis not present

## 2018-01-24 DIAGNOSIS — I471 Supraventricular tachycardia: Secondary | ICD-10-CM | POA: Diagnosis not present

## 2018-01-25 DIAGNOSIS — I471 Supraventricular tachycardia: Secondary | ICD-10-CM | POA: Diagnosis not present

## 2018-01-25 DIAGNOSIS — I2510 Atherosclerotic heart disease of native coronary artery without angina pectoris: Secondary | ICD-10-CM | POA: Diagnosis not present

## 2018-01-25 DIAGNOSIS — I480 Paroxysmal atrial fibrillation: Secondary | ICD-10-CM | POA: Diagnosis not present

## 2018-01-25 DIAGNOSIS — N186 End stage renal disease: Secondary | ICD-10-CM | POA: Diagnosis not present

## 2018-01-25 DIAGNOSIS — D631 Anemia in chronic kidney disease: Secondary | ICD-10-CM | POA: Diagnosis not present

## 2018-01-25 DIAGNOSIS — I1311 Hypertensive heart and chronic kidney disease without heart failure, with stage 5 chronic kidney disease, or end stage renal disease: Secondary | ICD-10-CM | POA: Diagnosis not present

## 2018-01-26 DIAGNOSIS — D631 Anemia in chronic kidney disease: Secondary | ICD-10-CM | POA: Diagnosis not present

## 2018-01-26 DIAGNOSIS — I471 Supraventricular tachycardia: Secondary | ICD-10-CM | POA: Diagnosis not present

## 2018-01-26 DIAGNOSIS — N186 End stage renal disease: Secondary | ICD-10-CM | POA: Diagnosis not present

## 2018-01-26 DIAGNOSIS — I1311 Hypertensive heart and chronic kidney disease without heart failure, with stage 5 chronic kidney disease, or end stage renal disease: Secondary | ICD-10-CM | POA: Diagnosis not present

## 2018-01-26 DIAGNOSIS — I2510 Atherosclerotic heart disease of native coronary artery without angina pectoris: Secondary | ICD-10-CM | POA: Diagnosis not present

## 2018-01-26 DIAGNOSIS — I480 Paroxysmal atrial fibrillation: Secondary | ICD-10-CM | POA: Diagnosis not present

## 2018-01-27 DIAGNOSIS — I471 Supraventricular tachycardia: Secondary | ICD-10-CM | POA: Diagnosis not present

## 2018-01-27 DIAGNOSIS — I1311 Hypertensive heart and chronic kidney disease without heart failure, with stage 5 chronic kidney disease, or end stage renal disease: Secondary | ICD-10-CM | POA: Diagnosis not present

## 2018-01-27 DIAGNOSIS — N186 End stage renal disease: Secondary | ICD-10-CM | POA: Diagnosis not present

## 2018-01-27 DIAGNOSIS — I2510 Atherosclerotic heart disease of native coronary artery without angina pectoris: Secondary | ICD-10-CM | POA: Diagnosis not present

## 2018-01-27 DIAGNOSIS — D631 Anemia in chronic kidney disease: Secondary | ICD-10-CM | POA: Diagnosis not present

## 2018-01-27 DIAGNOSIS — I480 Paroxysmal atrial fibrillation: Secondary | ICD-10-CM | POA: Diagnosis not present

## 2018-01-28 DIAGNOSIS — N186 End stage renal disease: Secondary | ICD-10-CM | POA: Diagnosis not present

## 2018-01-28 DIAGNOSIS — I2510 Atherosclerotic heart disease of native coronary artery without angina pectoris: Secondary | ICD-10-CM | POA: Diagnosis not present

## 2018-01-28 DIAGNOSIS — I471 Supraventricular tachycardia: Secondary | ICD-10-CM | POA: Diagnosis not present

## 2018-01-28 DIAGNOSIS — I1311 Hypertensive heart and chronic kidney disease without heart failure, with stage 5 chronic kidney disease, or end stage renal disease: Secondary | ICD-10-CM | POA: Diagnosis not present

## 2018-01-28 DIAGNOSIS — D631 Anemia in chronic kidney disease: Secondary | ICD-10-CM | POA: Diagnosis not present

## 2018-01-28 DIAGNOSIS — I480 Paroxysmal atrial fibrillation: Secondary | ICD-10-CM | POA: Diagnosis not present

## 2018-01-31 DIAGNOSIS — D631 Anemia in chronic kidney disease: Secondary | ICD-10-CM | POA: Diagnosis not present

## 2018-01-31 DIAGNOSIS — I471 Supraventricular tachycardia: Secondary | ICD-10-CM | POA: Diagnosis not present

## 2018-01-31 DIAGNOSIS — N186 End stage renal disease: Secondary | ICD-10-CM | POA: Diagnosis not present

## 2018-01-31 DIAGNOSIS — I1311 Hypertensive heart and chronic kidney disease without heart failure, with stage 5 chronic kidney disease, or end stage renal disease: Secondary | ICD-10-CM | POA: Diagnosis not present

## 2018-01-31 DIAGNOSIS — I480 Paroxysmal atrial fibrillation: Secondary | ICD-10-CM | POA: Diagnosis not present

## 2018-01-31 DIAGNOSIS — I2510 Atherosclerotic heart disease of native coronary artery without angina pectoris: Secondary | ICD-10-CM | POA: Diagnosis not present

## 2018-02-01 DIAGNOSIS — I2510 Atherosclerotic heart disease of native coronary artery without angina pectoris: Secondary | ICD-10-CM | POA: Diagnosis not present

## 2018-02-01 DIAGNOSIS — D631 Anemia in chronic kidney disease: Secondary | ICD-10-CM | POA: Diagnosis not present

## 2018-02-01 DIAGNOSIS — I480 Paroxysmal atrial fibrillation: Secondary | ICD-10-CM | POA: Diagnosis not present

## 2018-02-01 DIAGNOSIS — I471 Supraventricular tachycardia: Secondary | ICD-10-CM | POA: Diagnosis not present

## 2018-02-01 DIAGNOSIS — I1311 Hypertensive heart and chronic kidney disease without heart failure, with stage 5 chronic kidney disease, or end stage renal disease: Secondary | ICD-10-CM | POA: Diagnosis not present

## 2018-02-01 DIAGNOSIS — N186 End stage renal disease: Secondary | ICD-10-CM | POA: Diagnosis not present

## 2018-02-02 DIAGNOSIS — N186 End stage renal disease: Secondary | ICD-10-CM | POA: Diagnosis not present

## 2018-02-02 DIAGNOSIS — I2510 Atherosclerotic heart disease of native coronary artery without angina pectoris: Secondary | ICD-10-CM | POA: Diagnosis not present

## 2018-02-02 DIAGNOSIS — D631 Anemia in chronic kidney disease: Secondary | ICD-10-CM | POA: Diagnosis not present

## 2018-02-02 DIAGNOSIS — B351 Tinea unguium: Secondary | ICD-10-CM | POA: Diagnosis not present

## 2018-02-02 DIAGNOSIS — I1311 Hypertensive heart and chronic kidney disease without heart failure, with stage 5 chronic kidney disease, or end stage renal disease: Secondary | ICD-10-CM | POA: Diagnosis not present

## 2018-02-02 DIAGNOSIS — I471 Supraventricular tachycardia: Secondary | ICD-10-CM | POA: Diagnosis not present

## 2018-02-02 DIAGNOSIS — I739 Peripheral vascular disease, unspecified: Secondary | ICD-10-CM | POA: Diagnosis not present

## 2018-02-02 DIAGNOSIS — I480 Paroxysmal atrial fibrillation: Secondary | ICD-10-CM | POA: Diagnosis not present

## 2018-02-03 DIAGNOSIS — I471 Supraventricular tachycardia: Secondary | ICD-10-CM | POA: Diagnosis not present

## 2018-02-03 DIAGNOSIS — I1311 Hypertensive heart and chronic kidney disease without heart failure, with stage 5 chronic kidney disease, or end stage renal disease: Secondary | ICD-10-CM | POA: Diagnosis not present

## 2018-02-03 DIAGNOSIS — I480 Paroxysmal atrial fibrillation: Secondary | ICD-10-CM | POA: Diagnosis not present

## 2018-02-03 DIAGNOSIS — D631 Anemia in chronic kidney disease: Secondary | ICD-10-CM | POA: Diagnosis not present

## 2018-02-03 DIAGNOSIS — I2510 Atherosclerotic heart disease of native coronary artery without angina pectoris: Secondary | ICD-10-CM | POA: Diagnosis not present

## 2018-02-03 DIAGNOSIS — N186 End stage renal disease: Secondary | ICD-10-CM | POA: Diagnosis not present

## 2018-02-04 DIAGNOSIS — N186 End stage renal disease: Secondary | ICD-10-CM | POA: Diagnosis not present

## 2018-02-04 DIAGNOSIS — I2510 Atherosclerotic heart disease of native coronary artery without angina pectoris: Secondary | ICD-10-CM | POA: Diagnosis not present

## 2018-02-04 DIAGNOSIS — I480 Paroxysmal atrial fibrillation: Secondary | ICD-10-CM | POA: Diagnosis not present

## 2018-02-04 DIAGNOSIS — D631 Anemia in chronic kidney disease: Secondary | ICD-10-CM | POA: Diagnosis not present

## 2018-02-04 DIAGNOSIS — I471 Supraventricular tachycardia: Secondary | ICD-10-CM | POA: Diagnosis not present

## 2018-02-04 DIAGNOSIS — I1311 Hypertensive heart and chronic kidney disease without heart failure, with stage 5 chronic kidney disease, or end stage renal disease: Secondary | ICD-10-CM | POA: Diagnosis not present

## 2018-02-07 DIAGNOSIS — I480 Paroxysmal atrial fibrillation: Secondary | ICD-10-CM | POA: Diagnosis not present

## 2018-02-07 DIAGNOSIS — D631 Anemia in chronic kidney disease: Secondary | ICD-10-CM | POA: Diagnosis not present

## 2018-02-07 DIAGNOSIS — I471 Supraventricular tachycardia: Secondary | ICD-10-CM | POA: Diagnosis not present

## 2018-02-07 DIAGNOSIS — I2510 Atherosclerotic heart disease of native coronary artery without angina pectoris: Secondary | ICD-10-CM | POA: Diagnosis not present

## 2018-02-07 DIAGNOSIS — N186 End stage renal disease: Secondary | ICD-10-CM | POA: Diagnosis not present

## 2018-02-07 DIAGNOSIS — I1311 Hypertensive heart and chronic kidney disease without heart failure, with stage 5 chronic kidney disease, or end stage renal disease: Secondary | ICD-10-CM | POA: Diagnosis not present

## 2018-02-08 DIAGNOSIS — I471 Supraventricular tachycardia: Secondary | ICD-10-CM | POA: Diagnosis not present

## 2018-02-08 DIAGNOSIS — I1311 Hypertensive heart and chronic kidney disease without heart failure, with stage 5 chronic kidney disease, or end stage renal disease: Secondary | ICD-10-CM | POA: Diagnosis not present

## 2018-02-08 DIAGNOSIS — N186 End stage renal disease: Secondary | ICD-10-CM | POA: Diagnosis not present

## 2018-02-08 DIAGNOSIS — D631 Anemia in chronic kidney disease: Secondary | ICD-10-CM | POA: Diagnosis not present

## 2018-02-08 DIAGNOSIS — I480 Paroxysmal atrial fibrillation: Secondary | ICD-10-CM | POA: Diagnosis not present

## 2018-02-08 DIAGNOSIS — I2510 Atherosclerotic heart disease of native coronary artery without angina pectoris: Secondary | ICD-10-CM | POA: Diagnosis not present

## 2018-02-09 DIAGNOSIS — I480 Paroxysmal atrial fibrillation: Secondary | ICD-10-CM | POA: Diagnosis not present

## 2018-02-09 DIAGNOSIS — D631 Anemia in chronic kidney disease: Secondary | ICD-10-CM | POA: Diagnosis not present

## 2018-02-09 DIAGNOSIS — I471 Supraventricular tachycardia: Secondary | ICD-10-CM | POA: Diagnosis not present

## 2018-02-09 DIAGNOSIS — N186 End stage renal disease: Secondary | ICD-10-CM | POA: Diagnosis not present

## 2018-02-09 DIAGNOSIS — I1311 Hypertensive heart and chronic kidney disease without heart failure, with stage 5 chronic kidney disease, or end stage renal disease: Secondary | ICD-10-CM | POA: Diagnosis not present

## 2018-02-09 DIAGNOSIS — I2510 Atherosclerotic heart disease of native coronary artery without angina pectoris: Secondary | ICD-10-CM | POA: Diagnosis not present

## 2018-02-10 DIAGNOSIS — D631 Anemia in chronic kidney disease: Secondary | ICD-10-CM | POA: Diagnosis not present

## 2018-02-10 DIAGNOSIS — I471 Supraventricular tachycardia: Secondary | ICD-10-CM | POA: Diagnosis not present

## 2018-02-10 DIAGNOSIS — N186 End stage renal disease: Secondary | ICD-10-CM | POA: Diagnosis not present

## 2018-02-10 DIAGNOSIS — I2510 Atherosclerotic heart disease of native coronary artery without angina pectoris: Secondary | ICD-10-CM | POA: Diagnosis not present

## 2018-02-10 DIAGNOSIS — Z79899 Other long term (current) drug therapy: Secondary | ICD-10-CM | POA: Diagnosis not present

## 2018-02-10 DIAGNOSIS — I480 Paroxysmal atrial fibrillation: Secondary | ICD-10-CM | POA: Diagnosis not present

## 2018-02-10 DIAGNOSIS — I1311 Hypertensive heart and chronic kidney disease without heart failure, with stage 5 chronic kidney disease, or end stage renal disease: Secondary | ICD-10-CM | POA: Diagnosis not present

## 2018-02-11 DIAGNOSIS — I471 Supraventricular tachycardia: Secondary | ICD-10-CM | POA: Diagnosis not present

## 2018-02-11 DIAGNOSIS — I2510 Atherosclerotic heart disease of native coronary artery without angina pectoris: Secondary | ICD-10-CM | POA: Diagnosis not present

## 2018-02-11 DIAGNOSIS — D631 Anemia in chronic kidney disease: Secondary | ICD-10-CM | POA: Diagnosis not present

## 2018-02-11 DIAGNOSIS — N186 End stage renal disease: Secondary | ICD-10-CM | POA: Diagnosis not present

## 2018-02-11 DIAGNOSIS — I1311 Hypertensive heart and chronic kidney disease without heart failure, with stage 5 chronic kidney disease, or end stage renal disease: Secondary | ICD-10-CM | POA: Diagnosis not present

## 2018-02-11 DIAGNOSIS — I480 Paroxysmal atrial fibrillation: Secondary | ICD-10-CM | POA: Diagnosis not present

## 2018-02-14 DIAGNOSIS — I480 Paroxysmal atrial fibrillation: Secondary | ICD-10-CM | POA: Diagnosis not present

## 2018-02-14 DIAGNOSIS — D631 Anemia in chronic kidney disease: Secondary | ICD-10-CM | POA: Diagnosis not present

## 2018-02-14 DIAGNOSIS — I471 Supraventricular tachycardia: Secondary | ICD-10-CM | POA: Diagnosis not present

## 2018-02-14 DIAGNOSIS — I1311 Hypertensive heart and chronic kidney disease without heart failure, with stage 5 chronic kidney disease, or end stage renal disease: Secondary | ICD-10-CM | POA: Diagnosis not present

## 2018-02-14 DIAGNOSIS — I2510 Atherosclerotic heart disease of native coronary artery without angina pectoris: Secondary | ICD-10-CM | POA: Diagnosis not present

## 2018-02-14 DIAGNOSIS — N186 End stage renal disease: Secondary | ICD-10-CM | POA: Diagnosis not present

## 2018-02-15 DIAGNOSIS — I1311 Hypertensive heart and chronic kidney disease without heart failure, with stage 5 chronic kidney disease, or end stage renal disease: Secondary | ICD-10-CM | POA: Diagnosis not present

## 2018-02-15 DIAGNOSIS — D631 Anemia in chronic kidney disease: Secondary | ICD-10-CM | POA: Diagnosis not present

## 2018-02-15 DIAGNOSIS — I2510 Atherosclerotic heart disease of native coronary artery without angina pectoris: Secondary | ICD-10-CM | POA: Diagnosis not present

## 2018-02-15 DIAGNOSIS — I471 Supraventricular tachycardia: Secondary | ICD-10-CM | POA: Diagnosis not present

## 2018-02-15 DIAGNOSIS — N186 End stage renal disease: Secondary | ICD-10-CM | POA: Diagnosis not present

## 2018-02-15 DIAGNOSIS — I480 Paroxysmal atrial fibrillation: Secondary | ICD-10-CM | POA: Diagnosis not present

## 2018-02-16 DIAGNOSIS — I480 Paroxysmal atrial fibrillation: Secondary | ICD-10-CM | POA: Diagnosis not present

## 2018-02-16 DIAGNOSIS — I2510 Atherosclerotic heart disease of native coronary artery without angina pectoris: Secondary | ICD-10-CM | POA: Diagnosis not present

## 2018-02-16 DIAGNOSIS — I471 Supraventricular tachycardia: Secondary | ICD-10-CM | POA: Diagnosis not present

## 2018-02-16 DIAGNOSIS — D631 Anemia in chronic kidney disease: Secondary | ICD-10-CM | POA: Diagnosis not present

## 2018-02-16 DIAGNOSIS — N186 End stage renal disease: Secondary | ICD-10-CM | POA: Diagnosis not present

## 2018-02-16 DIAGNOSIS — I1311 Hypertensive heart and chronic kidney disease without heart failure, with stage 5 chronic kidney disease, or end stage renal disease: Secondary | ICD-10-CM | POA: Diagnosis not present

## 2018-02-17 DIAGNOSIS — D631 Anemia in chronic kidney disease: Secondary | ICD-10-CM | POA: Diagnosis not present

## 2018-02-17 DIAGNOSIS — N186 End stage renal disease: Secondary | ICD-10-CM | POA: Diagnosis not present

## 2018-02-17 DIAGNOSIS — I2510 Atherosclerotic heart disease of native coronary artery without angina pectoris: Secondary | ICD-10-CM | POA: Diagnosis not present

## 2018-02-17 DIAGNOSIS — I471 Supraventricular tachycardia: Secondary | ICD-10-CM | POA: Diagnosis not present

## 2018-02-17 DIAGNOSIS — I480 Paroxysmal atrial fibrillation: Secondary | ICD-10-CM | POA: Diagnosis not present

## 2018-02-17 DIAGNOSIS — I1311 Hypertensive heart and chronic kidney disease without heart failure, with stage 5 chronic kidney disease, or end stage renal disease: Secondary | ICD-10-CM | POA: Diagnosis not present

## 2018-02-18 DIAGNOSIS — I2510 Atherosclerotic heart disease of native coronary artery without angina pectoris: Secondary | ICD-10-CM | POA: Diagnosis not present

## 2018-02-18 DIAGNOSIS — I471 Supraventricular tachycardia: Secondary | ICD-10-CM | POA: Diagnosis not present

## 2018-02-18 DIAGNOSIS — I480 Paroxysmal atrial fibrillation: Secondary | ICD-10-CM | POA: Diagnosis not present

## 2018-02-18 DIAGNOSIS — I1311 Hypertensive heart and chronic kidney disease without heart failure, with stage 5 chronic kidney disease, or end stage renal disease: Secondary | ICD-10-CM | POA: Diagnosis not present

## 2018-02-18 DIAGNOSIS — N186 End stage renal disease: Secondary | ICD-10-CM | POA: Diagnosis not present

## 2018-02-18 DIAGNOSIS — D631 Anemia in chronic kidney disease: Secondary | ICD-10-CM | POA: Diagnosis not present

## 2018-02-20 DIAGNOSIS — I471 Supraventricular tachycardia: Secondary | ICD-10-CM | POA: Diagnosis not present

## 2018-02-20 DIAGNOSIS — E559 Vitamin D deficiency, unspecified: Secondary | ICD-10-CM | POA: Diagnosis not present

## 2018-02-20 DIAGNOSIS — D631 Anemia in chronic kidney disease: Secondary | ICD-10-CM | POA: Diagnosis not present

## 2018-02-20 DIAGNOSIS — R627 Adult failure to thrive: Secondary | ICD-10-CM | POA: Diagnosis not present

## 2018-02-20 DIAGNOSIS — Z7982 Long term (current) use of aspirin: Secondary | ICD-10-CM | POA: Diagnosis not present

## 2018-02-20 DIAGNOSIS — I1311 Hypertensive heart and chronic kidney disease without heart failure, with stage 5 chronic kidney disease, or end stage renal disease: Secondary | ICD-10-CM | POA: Diagnosis not present

## 2018-02-20 DIAGNOSIS — M109 Gout, unspecified: Secondary | ICD-10-CM | POA: Diagnosis not present

## 2018-02-20 DIAGNOSIS — Z9981 Dependence on supplemental oxygen: Secondary | ICD-10-CM | POA: Diagnosis not present

## 2018-02-20 DIAGNOSIS — Z8781 Personal history of (healed) traumatic fracture: Secondary | ICD-10-CM | POA: Diagnosis not present

## 2018-02-20 DIAGNOSIS — Z9181 History of falling: Secondary | ICD-10-CM | POA: Diagnosis not present

## 2018-02-20 DIAGNOSIS — E039 Hypothyroidism, unspecified: Secondary | ICD-10-CM | POA: Diagnosis not present

## 2018-02-20 DIAGNOSIS — N186 End stage renal disease: Secondary | ICD-10-CM | POA: Diagnosis not present

## 2018-02-20 DIAGNOSIS — I081 Rheumatic disorders of both mitral and tricuspid valves: Secondary | ICD-10-CM | POA: Diagnosis not present

## 2018-02-20 DIAGNOSIS — I2510 Atherosclerotic heart disease of native coronary artery without angina pectoris: Secondary | ICD-10-CM | POA: Diagnosis not present

## 2018-02-20 DIAGNOSIS — Z6822 Body mass index (BMI) 22.0-22.9, adult: Secondary | ICD-10-CM | POA: Diagnosis not present

## 2018-02-20 DIAGNOSIS — E785 Hyperlipidemia, unspecified: Secondary | ICD-10-CM | POA: Diagnosis not present

## 2018-02-20 DIAGNOSIS — R451 Restlessness and agitation: Secondary | ICD-10-CM | POA: Diagnosis not present

## 2018-02-20 DIAGNOSIS — F329 Major depressive disorder, single episode, unspecified: Secondary | ICD-10-CM | POA: Diagnosis not present

## 2018-02-20 DIAGNOSIS — I480 Paroxysmal atrial fibrillation: Secondary | ICD-10-CM | POA: Diagnosis not present

## 2018-02-21 DIAGNOSIS — I480 Paroxysmal atrial fibrillation: Secondary | ICD-10-CM | POA: Diagnosis not present

## 2018-02-21 DIAGNOSIS — D631 Anemia in chronic kidney disease: Secondary | ICD-10-CM | POA: Diagnosis not present

## 2018-02-21 DIAGNOSIS — I1311 Hypertensive heart and chronic kidney disease without heart failure, with stage 5 chronic kidney disease, or end stage renal disease: Secondary | ICD-10-CM | POA: Diagnosis not present

## 2018-02-21 DIAGNOSIS — I2510 Atherosclerotic heart disease of native coronary artery without angina pectoris: Secondary | ICD-10-CM | POA: Diagnosis not present

## 2018-02-21 DIAGNOSIS — I471 Supraventricular tachycardia: Secondary | ICD-10-CM | POA: Diagnosis not present

## 2018-02-21 DIAGNOSIS — N186 End stage renal disease: Secondary | ICD-10-CM | POA: Diagnosis not present

## 2018-02-22 DIAGNOSIS — I2510 Atherosclerotic heart disease of native coronary artery without angina pectoris: Secondary | ICD-10-CM | POA: Diagnosis not present

## 2018-02-22 DIAGNOSIS — I480 Paroxysmal atrial fibrillation: Secondary | ICD-10-CM | POA: Diagnosis not present

## 2018-02-22 DIAGNOSIS — I1311 Hypertensive heart and chronic kidney disease without heart failure, with stage 5 chronic kidney disease, or end stage renal disease: Secondary | ICD-10-CM | POA: Diagnosis not present

## 2018-02-22 DIAGNOSIS — D631 Anemia in chronic kidney disease: Secondary | ICD-10-CM | POA: Diagnosis not present

## 2018-02-22 DIAGNOSIS — N186 End stage renal disease: Secondary | ICD-10-CM | POA: Diagnosis not present

## 2018-02-22 DIAGNOSIS — I471 Supraventricular tachycardia: Secondary | ICD-10-CM | POA: Diagnosis not present

## 2018-02-23 DIAGNOSIS — I480 Paroxysmal atrial fibrillation: Secondary | ICD-10-CM | POA: Diagnosis not present

## 2018-02-23 DIAGNOSIS — D631 Anemia in chronic kidney disease: Secondary | ICD-10-CM | POA: Diagnosis not present

## 2018-02-23 DIAGNOSIS — I2510 Atherosclerotic heart disease of native coronary artery without angina pectoris: Secondary | ICD-10-CM | POA: Diagnosis not present

## 2018-02-23 DIAGNOSIS — N186 End stage renal disease: Secondary | ICD-10-CM | POA: Diagnosis not present

## 2018-02-23 DIAGNOSIS — I471 Supraventricular tachycardia: Secondary | ICD-10-CM | POA: Diagnosis not present

## 2018-02-23 DIAGNOSIS — I1311 Hypertensive heart and chronic kidney disease without heart failure, with stage 5 chronic kidney disease, or end stage renal disease: Secondary | ICD-10-CM | POA: Diagnosis not present

## 2018-02-24 DIAGNOSIS — I2510 Atherosclerotic heart disease of native coronary artery without angina pectoris: Secondary | ICD-10-CM | POA: Diagnosis not present

## 2018-02-24 DIAGNOSIS — D631 Anemia in chronic kidney disease: Secondary | ICD-10-CM | POA: Diagnosis not present

## 2018-02-24 DIAGNOSIS — I471 Supraventricular tachycardia: Secondary | ICD-10-CM | POA: Diagnosis not present

## 2018-02-24 DIAGNOSIS — N186 End stage renal disease: Secondary | ICD-10-CM | POA: Diagnosis not present

## 2018-02-24 DIAGNOSIS — I480 Paroxysmal atrial fibrillation: Secondary | ICD-10-CM | POA: Diagnosis not present

## 2018-02-24 DIAGNOSIS — I1311 Hypertensive heart and chronic kidney disease without heart failure, with stage 5 chronic kidney disease, or end stage renal disease: Secondary | ICD-10-CM | POA: Diagnosis not present

## 2018-02-25 DIAGNOSIS — I480 Paroxysmal atrial fibrillation: Secondary | ICD-10-CM | POA: Diagnosis not present

## 2018-02-25 DIAGNOSIS — I2510 Atherosclerotic heart disease of native coronary artery without angina pectoris: Secondary | ICD-10-CM | POA: Diagnosis not present

## 2018-02-25 DIAGNOSIS — N186 End stage renal disease: Secondary | ICD-10-CM | POA: Diagnosis not present

## 2018-02-25 DIAGNOSIS — D631 Anemia in chronic kidney disease: Secondary | ICD-10-CM | POA: Diagnosis not present

## 2018-02-25 DIAGNOSIS — I471 Supraventricular tachycardia: Secondary | ICD-10-CM | POA: Diagnosis not present

## 2018-02-25 DIAGNOSIS — I1311 Hypertensive heart and chronic kidney disease without heart failure, with stage 5 chronic kidney disease, or end stage renal disease: Secondary | ICD-10-CM | POA: Diagnosis not present

## 2018-02-28 DIAGNOSIS — I1311 Hypertensive heart and chronic kidney disease without heart failure, with stage 5 chronic kidney disease, or end stage renal disease: Secondary | ICD-10-CM | POA: Diagnosis not present

## 2018-02-28 DIAGNOSIS — I2510 Atherosclerotic heart disease of native coronary artery without angina pectoris: Secondary | ICD-10-CM | POA: Diagnosis not present

## 2018-02-28 DIAGNOSIS — I471 Supraventricular tachycardia: Secondary | ICD-10-CM | POA: Diagnosis not present

## 2018-02-28 DIAGNOSIS — D631 Anemia in chronic kidney disease: Secondary | ICD-10-CM | POA: Diagnosis not present

## 2018-02-28 DIAGNOSIS — N186 End stage renal disease: Secondary | ICD-10-CM | POA: Diagnosis not present

## 2018-02-28 DIAGNOSIS — I480 Paroxysmal atrial fibrillation: Secondary | ICD-10-CM | POA: Diagnosis not present

## 2018-03-01 DIAGNOSIS — D631 Anemia in chronic kidney disease: Secondary | ICD-10-CM | POA: Diagnosis not present

## 2018-03-01 DIAGNOSIS — I1311 Hypertensive heart and chronic kidney disease without heart failure, with stage 5 chronic kidney disease, or end stage renal disease: Secondary | ICD-10-CM | POA: Diagnosis not present

## 2018-03-01 DIAGNOSIS — N186 End stage renal disease: Secondary | ICD-10-CM | POA: Diagnosis not present

## 2018-03-01 DIAGNOSIS — I471 Supraventricular tachycardia: Secondary | ICD-10-CM | POA: Diagnosis not present

## 2018-03-01 DIAGNOSIS — I2510 Atherosclerotic heart disease of native coronary artery without angina pectoris: Secondary | ICD-10-CM | POA: Diagnosis not present

## 2018-03-01 DIAGNOSIS — I480 Paroxysmal atrial fibrillation: Secondary | ICD-10-CM | POA: Diagnosis not present

## 2018-03-02 DIAGNOSIS — I480 Paroxysmal atrial fibrillation: Secondary | ICD-10-CM | POA: Diagnosis not present

## 2018-03-02 DIAGNOSIS — I1311 Hypertensive heart and chronic kidney disease without heart failure, with stage 5 chronic kidney disease, or end stage renal disease: Secondary | ICD-10-CM | POA: Diagnosis not present

## 2018-03-02 DIAGNOSIS — I2510 Atherosclerotic heart disease of native coronary artery without angina pectoris: Secondary | ICD-10-CM | POA: Diagnosis not present

## 2018-03-02 DIAGNOSIS — I471 Supraventricular tachycardia: Secondary | ICD-10-CM | POA: Diagnosis not present

## 2018-03-02 DIAGNOSIS — D631 Anemia in chronic kidney disease: Secondary | ICD-10-CM | POA: Diagnosis not present

## 2018-03-02 DIAGNOSIS — N186 End stage renal disease: Secondary | ICD-10-CM | POA: Diagnosis not present

## 2018-03-03 DIAGNOSIS — D631 Anemia in chronic kidney disease: Secondary | ICD-10-CM | POA: Diagnosis not present

## 2018-03-03 DIAGNOSIS — I1311 Hypertensive heart and chronic kidney disease without heart failure, with stage 5 chronic kidney disease, or end stage renal disease: Secondary | ICD-10-CM | POA: Diagnosis not present

## 2018-03-03 DIAGNOSIS — N186 End stage renal disease: Secondary | ICD-10-CM | POA: Diagnosis not present

## 2018-03-03 DIAGNOSIS — I471 Supraventricular tachycardia: Secondary | ICD-10-CM | POA: Diagnosis not present

## 2018-03-03 DIAGNOSIS — I2510 Atherosclerotic heart disease of native coronary artery without angina pectoris: Secondary | ICD-10-CM | POA: Diagnosis not present

## 2018-03-03 DIAGNOSIS — I480 Paroxysmal atrial fibrillation: Secondary | ICD-10-CM | POA: Diagnosis not present

## 2018-03-04 DIAGNOSIS — N186 End stage renal disease: Secondary | ICD-10-CM | POA: Diagnosis not present

## 2018-03-04 DIAGNOSIS — I2510 Atherosclerotic heart disease of native coronary artery without angina pectoris: Secondary | ICD-10-CM | POA: Diagnosis not present

## 2018-03-04 DIAGNOSIS — I471 Supraventricular tachycardia: Secondary | ICD-10-CM | POA: Diagnosis not present

## 2018-03-04 DIAGNOSIS — I480 Paroxysmal atrial fibrillation: Secondary | ICD-10-CM | POA: Diagnosis not present

## 2018-03-04 DIAGNOSIS — I1311 Hypertensive heart and chronic kidney disease without heart failure, with stage 5 chronic kidney disease, or end stage renal disease: Secondary | ICD-10-CM | POA: Diagnosis not present

## 2018-03-04 DIAGNOSIS — D631 Anemia in chronic kidney disease: Secondary | ICD-10-CM | POA: Diagnosis not present

## 2018-03-07 DIAGNOSIS — I2510 Atherosclerotic heart disease of native coronary artery without angina pectoris: Secondary | ICD-10-CM | POA: Diagnosis not present

## 2018-03-07 DIAGNOSIS — D631 Anemia in chronic kidney disease: Secondary | ICD-10-CM | POA: Diagnosis not present

## 2018-03-07 DIAGNOSIS — N186 End stage renal disease: Secondary | ICD-10-CM | POA: Diagnosis not present

## 2018-03-07 DIAGNOSIS — I1311 Hypertensive heart and chronic kidney disease without heart failure, with stage 5 chronic kidney disease, or end stage renal disease: Secondary | ICD-10-CM | POA: Diagnosis not present

## 2018-03-07 DIAGNOSIS — I480 Paroxysmal atrial fibrillation: Secondary | ICD-10-CM | POA: Diagnosis not present

## 2018-03-07 DIAGNOSIS — I471 Supraventricular tachycardia: Secondary | ICD-10-CM | POA: Diagnosis not present

## 2018-03-08 DIAGNOSIS — I471 Supraventricular tachycardia: Secondary | ICD-10-CM | POA: Diagnosis not present

## 2018-03-08 DIAGNOSIS — I480 Paroxysmal atrial fibrillation: Secondary | ICD-10-CM | POA: Diagnosis not present

## 2018-03-08 DIAGNOSIS — I2510 Atherosclerotic heart disease of native coronary artery without angina pectoris: Secondary | ICD-10-CM | POA: Diagnosis not present

## 2018-03-08 DIAGNOSIS — N186 End stage renal disease: Secondary | ICD-10-CM | POA: Diagnosis not present

## 2018-03-08 DIAGNOSIS — D631 Anemia in chronic kidney disease: Secondary | ICD-10-CM | POA: Diagnosis not present

## 2018-03-08 DIAGNOSIS — I1311 Hypertensive heart and chronic kidney disease without heart failure, with stage 5 chronic kidney disease, or end stage renal disease: Secondary | ICD-10-CM | POA: Diagnosis not present

## 2018-03-09 DIAGNOSIS — I480 Paroxysmal atrial fibrillation: Secondary | ICD-10-CM | POA: Diagnosis not present

## 2018-03-09 DIAGNOSIS — N186 End stage renal disease: Secondary | ICD-10-CM | POA: Diagnosis not present

## 2018-03-09 DIAGNOSIS — I2510 Atherosclerotic heart disease of native coronary artery without angina pectoris: Secondary | ICD-10-CM | POA: Diagnosis not present

## 2018-03-09 DIAGNOSIS — I471 Supraventricular tachycardia: Secondary | ICD-10-CM | POA: Diagnosis not present

## 2018-03-09 DIAGNOSIS — I1311 Hypertensive heart and chronic kidney disease without heart failure, with stage 5 chronic kidney disease, or end stage renal disease: Secondary | ICD-10-CM | POA: Diagnosis not present

## 2018-03-09 DIAGNOSIS — D631 Anemia in chronic kidney disease: Secondary | ICD-10-CM | POA: Diagnosis not present

## 2018-03-10 DIAGNOSIS — D631 Anemia in chronic kidney disease: Secondary | ICD-10-CM | POA: Diagnosis not present

## 2018-03-10 DIAGNOSIS — I471 Supraventricular tachycardia: Secondary | ICD-10-CM | POA: Diagnosis not present

## 2018-03-10 DIAGNOSIS — I2510 Atherosclerotic heart disease of native coronary artery without angina pectoris: Secondary | ICD-10-CM | POA: Diagnosis not present

## 2018-03-10 DIAGNOSIS — N186 End stage renal disease: Secondary | ICD-10-CM | POA: Diagnosis not present

## 2018-03-10 DIAGNOSIS — I1311 Hypertensive heart and chronic kidney disease without heart failure, with stage 5 chronic kidney disease, or end stage renal disease: Secondary | ICD-10-CM | POA: Diagnosis not present

## 2018-03-10 DIAGNOSIS — I480 Paroxysmal atrial fibrillation: Secondary | ICD-10-CM | POA: Diagnosis not present

## 2018-03-11 DIAGNOSIS — I471 Supraventricular tachycardia: Secondary | ICD-10-CM | POA: Diagnosis not present

## 2018-03-11 DIAGNOSIS — I1311 Hypertensive heart and chronic kidney disease without heart failure, with stage 5 chronic kidney disease, or end stage renal disease: Secondary | ICD-10-CM | POA: Diagnosis not present

## 2018-03-11 DIAGNOSIS — I2510 Atherosclerotic heart disease of native coronary artery without angina pectoris: Secondary | ICD-10-CM | POA: Diagnosis not present

## 2018-03-11 DIAGNOSIS — D631 Anemia in chronic kidney disease: Secondary | ICD-10-CM | POA: Diagnosis not present

## 2018-03-11 DIAGNOSIS — I480 Paroxysmal atrial fibrillation: Secondary | ICD-10-CM | POA: Diagnosis not present

## 2018-03-11 DIAGNOSIS — N186 End stage renal disease: Secondary | ICD-10-CM | POA: Diagnosis not present

## 2018-03-14 DIAGNOSIS — I471 Supraventricular tachycardia: Secondary | ICD-10-CM | POA: Diagnosis not present

## 2018-03-14 DIAGNOSIS — I1311 Hypertensive heart and chronic kidney disease without heart failure, with stage 5 chronic kidney disease, or end stage renal disease: Secondary | ICD-10-CM | POA: Diagnosis not present

## 2018-03-14 DIAGNOSIS — D631 Anemia in chronic kidney disease: Secondary | ICD-10-CM | POA: Diagnosis not present

## 2018-03-14 DIAGNOSIS — I2510 Atherosclerotic heart disease of native coronary artery without angina pectoris: Secondary | ICD-10-CM | POA: Diagnosis not present

## 2018-03-14 DIAGNOSIS — N186 End stage renal disease: Secondary | ICD-10-CM | POA: Diagnosis not present

## 2018-03-14 DIAGNOSIS — I480 Paroxysmal atrial fibrillation: Secondary | ICD-10-CM | POA: Diagnosis not present

## 2018-03-15 DIAGNOSIS — I480 Paroxysmal atrial fibrillation: Secondary | ICD-10-CM | POA: Diagnosis not present

## 2018-03-15 DIAGNOSIS — I1311 Hypertensive heart and chronic kidney disease without heart failure, with stage 5 chronic kidney disease, or end stage renal disease: Secondary | ICD-10-CM | POA: Diagnosis not present

## 2018-03-15 DIAGNOSIS — D631 Anemia in chronic kidney disease: Secondary | ICD-10-CM | POA: Diagnosis not present

## 2018-03-15 DIAGNOSIS — I471 Supraventricular tachycardia: Secondary | ICD-10-CM | POA: Diagnosis not present

## 2018-03-15 DIAGNOSIS — F320 Major depressive disorder, single episode, mild: Secondary | ICD-10-CM | POA: Diagnosis not present

## 2018-03-15 DIAGNOSIS — N186 End stage renal disease: Secondary | ICD-10-CM | POA: Diagnosis not present

## 2018-03-15 DIAGNOSIS — I2510 Atherosclerotic heart disease of native coronary artery without angina pectoris: Secondary | ICD-10-CM | POA: Diagnosis not present

## 2018-03-16 DIAGNOSIS — I480 Paroxysmal atrial fibrillation: Secondary | ICD-10-CM | POA: Diagnosis not present

## 2018-03-16 DIAGNOSIS — I2510 Atherosclerotic heart disease of native coronary artery without angina pectoris: Secondary | ICD-10-CM | POA: Diagnosis not present

## 2018-03-16 DIAGNOSIS — I1311 Hypertensive heart and chronic kidney disease without heart failure, with stage 5 chronic kidney disease, or end stage renal disease: Secondary | ICD-10-CM | POA: Diagnosis not present

## 2018-03-16 DIAGNOSIS — I471 Supraventricular tachycardia: Secondary | ICD-10-CM | POA: Diagnosis not present

## 2018-03-16 DIAGNOSIS — N186 End stage renal disease: Secondary | ICD-10-CM | POA: Diagnosis not present

## 2018-03-16 DIAGNOSIS — D631 Anemia in chronic kidney disease: Secondary | ICD-10-CM | POA: Diagnosis not present

## 2018-03-17 DIAGNOSIS — I471 Supraventricular tachycardia: Secondary | ICD-10-CM | POA: Diagnosis not present

## 2018-03-17 DIAGNOSIS — I1311 Hypertensive heart and chronic kidney disease without heart failure, with stage 5 chronic kidney disease, or end stage renal disease: Secondary | ICD-10-CM | POA: Diagnosis not present

## 2018-03-17 DIAGNOSIS — N186 End stage renal disease: Secondary | ICD-10-CM | POA: Diagnosis not present

## 2018-03-17 DIAGNOSIS — I480 Paroxysmal atrial fibrillation: Secondary | ICD-10-CM | POA: Diagnosis not present

## 2018-03-17 DIAGNOSIS — I2510 Atherosclerotic heart disease of native coronary artery without angina pectoris: Secondary | ICD-10-CM | POA: Diagnosis not present

## 2018-03-17 DIAGNOSIS — D631 Anemia in chronic kidney disease: Secondary | ICD-10-CM | POA: Diagnosis not present

## 2018-03-24 DIAGNOSIS — I480 Paroxysmal atrial fibrillation: Secondary | ICD-10-CM | POA: Diagnosis not present

## 2018-03-24 DIAGNOSIS — I5189 Other ill-defined heart diseases: Secondary | ICD-10-CM | POA: Diagnosis not present

## 2018-03-24 DIAGNOSIS — N184 Chronic kidney disease, stage 4 (severe): Secondary | ICD-10-CM | POA: Diagnosis not present

## 2018-03-24 DIAGNOSIS — E119 Type 2 diabetes mellitus without complications: Secondary | ICD-10-CM | POA: Diagnosis not present

## 2018-04-14 DIAGNOSIS — Z23 Encounter for immunization: Secondary | ICD-10-CM | POA: Diagnosis not present

## 2018-05-04 DIAGNOSIS — I739 Peripheral vascular disease, unspecified: Secondary | ICD-10-CM | POA: Diagnosis not present

## 2018-05-04 DIAGNOSIS — R262 Difficulty in walking, not elsewhere classified: Secondary | ICD-10-CM | POA: Diagnosis not present

## 2018-05-04 DIAGNOSIS — B351 Tinea unguium: Secondary | ICD-10-CM | POA: Diagnosis not present

## 2018-05-04 DIAGNOSIS — L603 Nail dystrophy: Secondary | ICD-10-CM | POA: Diagnosis not present

## 2018-05-24 DIAGNOSIS — D638 Anemia in other chronic diseases classified elsewhere: Secondary | ICD-10-CM | POA: Diagnosis not present

## 2018-05-24 DIAGNOSIS — I10 Essential (primary) hypertension: Secondary | ICD-10-CM | POA: Diagnosis not present

## 2018-05-24 DIAGNOSIS — I480 Paroxysmal atrial fibrillation: Secondary | ICD-10-CM | POA: Diagnosis not present

## 2018-05-24 DIAGNOSIS — E038 Other specified hypothyroidism: Secondary | ICD-10-CM | POA: Diagnosis not present

## 2018-05-25 DIAGNOSIS — E119 Type 2 diabetes mellitus without complications: Secondary | ICD-10-CM | POA: Diagnosis not present

## 2018-05-25 DIAGNOSIS — N189 Chronic kidney disease, unspecified: Secondary | ICD-10-CM | POA: Diagnosis not present

## 2018-05-25 DIAGNOSIS — D649 Anemia, unspecified: Secondary | ICD-10-CM | POA: Diagnosis not present

## 2018-05-25 DIAGNOSIS — Z79899 Other long term (current) drug therapy: Secondary | ICD-10-CM | POA: Diagnosis not present

## 2018-07-01 ENCOUNTER — Non-Acute Institutional Stay: Payer: Medicare Other | Admitting: Nurse Practitioner

## 2018-07-01 ENCOUNTER — Encounter: Payer: Self-pay | Admitting: Nurse Practitioner

## 2018-07-01 VITALS — HR 78 | Temp 98.0°F | Resp 18 | Wt 126.6 lb

## 2018-07-01 DIAGNOSIS — R5381 Other malaise: Secondary | ICD-10-CM | POA: Insufficient documentation

## 2018-07-01 DIAGNOSIS — R1319 Other dysphagia: Secondary | ICD-10-CM | POA: Insufficient documentation

## 2018-07-01 DIAGNOSIS — Z515 Encounter for palliative care: Secondary | ICD-10-CM | POA: Insufficient documentation

## 2018-07-01 NOTE — Progress Notes (Signed)
Community Palliative Care Telephone: [phone] Fax: [phone]  PATIENT NAME: Catherine Anderson DOB: 05/20/1923 MRN: [id number]  PRIMARY CARE PROVIDER:   Dr Cory Munch PROVIDER:  Dr Slade-Herman/[facility] RESPONSIBLE PARTY:   Catherine Anderson niece [phone]  ASSESSMENT:     I visited and observed Catherine Anderson. She does appear function quadriplegic, lethargic, chronically ill. She does open her eyes to verbal cues but no verbal interaction and she quickly returns to sleep. She was cooperative with assessment. Is there any does appear to be a overall decline in the setting of chronic disease functioning cognitively declining. I have attempted to contact her niece Catherine Anderson for update on palliative care visit and will continue. Updated nursing staff the new changes to current goals or plan of care. DNR remains in place with goals to focus on comfort.  12 / 4 / 2019 sodium 140, potassium 4.3, chloride 98, Co2 28, calcium 9.3, bun 31.6, creatinine 1.87, glucose 85, hemoglobin A1c 6.1, WBC 7.0, hemoglobin 11.8, hematocrit 36, platelets 245  Palliative care 10/10 2019 Palliative care 11 / 29 / 2017 to 12 / 6 / 2017 Hospice 12 / 8 / 2017 to 9 / 26 / 2019  9 / 26 / 2019 weight 130.0 lbs 1 / 2 / 2020 weight 126.6 lbs   RECOMMENDATIONS and PLAN:   1. Palliative care encounter Z51.5; Palliative medicine team will continue to support patient, patient's family, and medical team. Visit consisted of counseling and education dealing with the complex and emotionally intense issues of symptom management and palliative care in the setting of serious and potentially life-threatening illness  2. Dysphagia R13.10; Continue to monitor weights, appetite, aspiration precautions.   3. Debility R53.81 secondary to CKD, encourage passive rom; encourage to transfer to geri-chair.   I spent 45 minutes providing this consultation, from 12:30pm  to 1:15pm. More than 50% of the time in this consultation  was spent coordinating communication.   HISTORY OF PRESENT ILLNESS:  Catherine Anderson is a 85 y.o. year old female with multiple medical problems including Chronic kidney disease stage 4, SVT, v-tach, hypothyroidism, hypertension, hyperlipidemia, gout, history GI bleed, history of breast surgery. Catherine. Anderson continues to reside at skilled [facility], bed-bound, ADL dependent with incontinence. She does require to be fed and appetite has been fair to poor per staff. She has had a slight weight loss. Last primary provider visit 12 / 3 / 2019 for regular visit for each other direction, chronic kidney disease, diabetes, hypertension, hypothyroidism common depression. She is on Lexapro 5 mg daily and it changes at this visit. No recent exacerbations, Falls, wounds, hospitalizations. She is cognitively impaired and unable to verbalize her needs. At present time she is lying in bed, appeaes debilitated, chronically ill but comfortable on O2 dependent. No visitors present. Palliative Care was asked to help address goals of care.   CODE STATUS: DNR  PPS: 30% HOSPICE ELIGIBILITY/DIAGNOSIS: TBD  PAST MEDICAL HISTORY:  Past Medical History:  Diagnosis Date  . CKD (chronic kidney disease), stage IV (HCC)   . Gout   . History of GI bleed   . HLD (hyperlipidemia)   . Hypertension   . Thyroid disease     SOCIAL HX:  Social History   Tobacco Use  . Smoking status: Never Smoker  . Smokeless tobacco: Never Used  Substance Use Topics  . Alcohol use: No    ALLERGIES: No Known Allergies   PERTINENT MEDICATIONS:  Outpatient Encounter Medications as  of 07/01/2018  Medication Sig  . antiseptic oral rinse (BIOTENE) LIQD Apply 15 mLs topically as needed for dry mouth.  Marland Kitchen aspirin EC 325 MG tablet Take 1 tablet (325 mg total) by mouth daily.  . Banana Flakes (BANATROL PLUS) PACK Take 1 packet by mouth 3 (three) times daily.  Marland Kitchen diltiazem (CARDIZEM CD) 120 MG 24 hr capsule Take 1 capsule (120  mg total) by mouth daily.  Marland Kitchen ENSURE PLUS (ENSURE PLUS) LIQD Take 120 mLs by mouth 3 (three) times daily between meals.  . ferrous sulfate 325 (65 FE) MG tablet Take 325 mg by mouth daily with breakfast.  . haloperidol (HALDOL) 1 MG tablet Take 1 tablet (1 mg total) by mouth 2 (two) times daily as needed for agitation.  Marland Kitchen levothyroxine (SYNTHROID, LEVOTHROID) 125 MCG tablet Take 0.5 tablets (62.5 mcg total) by mouth daily before breakfast.  . mirtazapine (REMERON SOL-TAB) 15 MG disintegrating tablet Take 15 mg by mouth at bedtime.  . polyvinyl alcohol (LIQUIFILM TEARS) 1.4 % ophthalmic solution Place 1 drop into both eyes 4 (four) times daily as needed for dry eyes.  . sodium bicarbonate 650 MG tablet Take 1 tablet (650 mg total) by mouth 2 (two) times daily.  . vancomycin (VANCOCIN) 50 mg/mL oral solution Take 2.5 mLs (125 mg total) by mouth every 6 (six) hours.   No facility-administered encounter medications on file as of 07/01/2018.     PHYSICAL EXAM:   General: frail appearing, chronically ill, lethargic, debilitated female Cardiovascular: regular rate and rhythm Pulmonary: clear ant fields Abdomen: soft, nontender, + bowel sounds GU: no suprapubic tenderness Extremities: no edema, no joint deformities Skin: no rashes Neurological: Weakness but otherwise nonfocal/functional quadriplegic  Catherine Marlar Prince [place], NP

## 2018-09-08 ENCOUNTER — Non-Acute Institutional Stay: Payer: Medicare Other | Admitting: Primary Care

## 2018-09-08 ENCOUNTER — Other Ambulatory Visit: Payer: Self-pay

## 2018-09-09 ENCOUNTER — Non-Acute Institutional Stay: Payer: Medicare Other | Admitting: Primary Care

## 2018-09-09 ENCOUNTER — Other Ambulatory Visit: Payer: Self-pay

## 2018-09-09 DIAGNOSIS — R5381 Other malaise: Secondary | ICD-10-CM

## 2018-09-09 DIAGNOSIS — Z515 Encounter for palliative care: Secondary | ICD-10-CM

## 2018-09-09 NOTE — Progress Notes (Signed)
Therapist, nutritional Palliative Care Consult Note Telephone: [phone]  Fax: [phone]  PATIENT NAME: Catherine Anderson DOB: [birth year] MRN: [id number]  PRIMARY CARE PROVIDER:   Keane Police, MD  REFERRING PROVIDER:  Keane Police, MD [phone] [place] [address]   RESPONSIBLE PARTY:   Extended Emergency Contact Information Primary Emergency Contact: Hudson,Jeff Address: [address]          [place], [place] [address] D[place] of [place] Home Phone: [phone] Relation: Nephew Secondary Emergency Contact: Mahan,Chris Address: [address]          [place], [place] [address] [place] of [place] Home Phone: [phone] Work Phone: [phone] Relation: Nephew  Palliative Care Consultation Visit:  ASSESSMENT and RECOMMENDATIONS:   1.Goals of care: Advance directive decisions of family need reviewed  Patient non interactive, staff state no new concerns. Called multiple numbers of emergency contacts and found a niece, Bethann Berkshire, who told me that Alona Bene has recently passed away. She states nephew Celesta Gentile is POA. I called but no voice mail is set up. Arline Asp will call back Monday with more numbers. Will continue to pursue finding POA and discussion goals of care.   I spent 15 minutes providing this consultation,  from 0845 to 0900. More than 50% of the time in this consultation was spent coordinating communication.   HISTORY OF PRESENT ILLNESS:  Catherine Anderson is a 85 y.o. year old female with multiple medical problems including CKD, gout, dementia. Palliative Care was asked to help address goals of care.   CODE STATUS: No MOST form, no DNR goldenrod on chart  PPS: 30% HOSPICE ELIGIBILITY/DIAGNOSIS: TBD  PAST MEDICAL HISTORY:  Past Medical History:  Diagnosis Date  . CKD (chronic kidney disease), stage IV (HCC)   . Gout   . History of GI bleed   . HLD (hyperlipidemia)   . Hypertension   .  Thyroid disease     SOCIAL HX:  Social History   Tobacco Use  . Smoking status: Never Smoker  . Smokeless tobacco: Never Used  Substance Use Topics  . Alcohol use: No    ALLERGIES: No Known Allergies   PERTINENT MEDICATIONS:  Outpatient Encounter Medications as of 09/09/2018  Medication Sig  . antiseptic oral rinse (BIOTENE) LIQD Apply 15 mLs topically as needed for dry mouth.  Marland Kitchen aspirin EC 325 MG tablet Take 1 tablet (325 mg total) by mouth daily.  . Banana Flakes (BANATROL PLUS) PACK Take 1 packet by mouth 3 (three) times daily.  Marland Kitchen diltiazem (CARDIZEM CD) 120 MG 24 hr capsule Take 1 capsule (120 mg total) by mouth daily.  Marland Kitchen ENSURE PLUS (ENSURE PLUS) LIQD Take 120 mLs by mouth 3 (three) times daily between meals.  . ferrous sulfate 325 (65 FE) MG tablet Take 325 mg by mouth daily with breakfast.  . haloperidol (HALDOL) 1 MG tablet Take 1 tablet (1 mg total) by mouth 2 (two) times daily as needed for agitation.  Marland Kitchen levothyroxine (SYNTHROID, LEVOTHROID) 125 MCG tablet Take 0.5 tablets (62.5 mcg total) by mouth daily before breakfast.  . mirtazapine (REMERON SOL-TAB) 15 MG disintegrating tablet Take 15 mg by mouth at bedtime.  . polyvinyl alcohol (LIQUIFILM TEARS) 1.4 % ophthalmic solution Place 1 drop into both eyes 4 (four) times daily as needed for dry eyes.  . sodium bicarbonate 650 MG tablet Take 1 tablet (650 mg total) by mouth 2 (two) times daily.  . vancomycin (VANCOCIN) 50 mg/mL oral solution  Take 2.5 mLs (125 mg total) by mouth every 6 (six) hours.   No facility-administered encounter medications on file as of 09/09/2018.     PHYSICAL EXAM:  Arm circ 25 cm Wt unavailable General: NAD, frail appearing, thin Cardiovascular: regular rate and rhythm , S1S2 Pulmonary: clear all fields, no cough Abdomen: soft, nontender, + bowel sounds, incontinent GU: no suprapubic tenderness, incontinent Extremities: no edema, contractures of LE Skin: no rashes or wounds on gross exam  Neurological: Weakness , non verbal, regards but not  interactive with interview  Marijo File DNP AGPCNP-BC

## 2018-10-12 ENCOUNTER — Other Ambulatory Visit: Payer: Self-pay

## 2018-10-12 ENCOUNTER — Non-Acute Institutional Stay: Payer: Medicare Other | Admitting: Primary Care

## 2018-10-12 DIAGNOSIS — G301 Alzheimer's disease with late onset: Secondary | ICD-10-CM

## 2018-10-12 DIAGNOSIS — Z515 Encounter for palliative care: Secondary | ICD-10-CM

## 2018-10-12 DIAGNOSIS — F0280 Dementia in other diseases classified elsewhere without behavioral disturbance: Secondary | ICD-10-CM

## 2018-10-12 NOTE — Progress Notes (Signed)
[facility] Consult Note Telephone: [phone]  Fax: [phone]  TELEHEALTH VISIT STATEMENT Due to the COVID-19 crisis, this visit was done via telemedicine from my office. It was initiated and consented to by this patient and/or family.  PATIENT NAME: Catherine Anderson DOB: 1923/03/20 MRN: [id number]  PRIMARY CARE PROVIDER:   Keane PoliceSlade-Hartman, Venezela, MD  REFERRING PROVIDER:  Keane PoliceSlade-Hartman, Venezela, MD [phone] [place], [place] [address]  RESPONSIBLE PARTY:  Extended Emergency Contact Information Primary Emergency Contact: Catherine Anderson Address: [address]          [place], [place] [address] [place] Home Phone: [phone] Work Phone: [phone] Relation: Relative Secondary Emergency Contact: Catherine Anderson Address: [address]          [place], [place] [address] [place] Mobile Phone: [phone] Relation: Nephew   ASSESSMENT and RECOMMENDATIONS:   1. Goals of care:  DNR per POA. Reached POA [name]. Catherine Anderson's mother had been Catherine Anderson's POA but recently passed away. Catherine Anderson states he chooses DNR, citing patient would likely not survive the maneuver. States he had to make this decision for his mother as well, and how difficult it was. [facility]  2. Dementia: Continue supportive care. FAST Score is 7 c-d. Patient was recently considered for hospice enrollment but family decided to wait. Clarified with Catherine Anderson it was a mutual decision with hospice, palliative and family. Will continue to monitor for decline and appropriate referral.  3. Skin integrity: Recommend geri sleeves. Skin tears on left arm with foam dressing, staff reports sacrum is intact but has preventative foam dressing. Patient gets oob on occasion with total  lift but is essentially bedbound.   Palliative care will continue to follow for goals of care clarification and symptom management.  Return 4 weeks or prn.  I spent 25 minutes providing this consultation,  from 1100 to 1125. More than 50% of the time in this consultation was spent coordinating communication.   HISTORY OF PRESENT ILLNESS:  Catherine Anderson is a 85 y.o. year old female with multiple medical problems including CKD, gout, advanced dementia. Palliative Care was asked to help address goals of care.   CODE STATUS: DNR  PPS: 30% weak HOSPICE ELIGIBILITY/DIAGNOSIS: yes/dementia, end stage  PAST MEDICAL HISTORY:  Past Medical History:  Diagnosis Date  . CKD (chronic kidney disease), stage IV (HCC)   . Gout   . History of GI bleed   . HLD (hyperlipidemia)   . Hypertension   . Thyroid disease     SOCIAL HX:  Social History   Tobacco Use  . Smoking status: Never Smoker  . Smokeless tobacco: Never Used  Substance Use Topics  . Alcohol use: No    ALLERGIES: No Known Allergies   PERTINENT MEDICATIONS:  Outpatient Encounter Medications as of 10/12/2018  Medication Sig  . antiseptic oral rinse (BIOTENE) LIQD Apply 15 mLs topically as needed for dry mouth.  Marland Kitchen. aspirin EC 325 MG tablet Take 1 tablet (325 mg total) by mouth daily.  . Banana Flakes (BANATROL PLUS) PACK Take 1 packet by mouth 3 (three) times daily.  Marland Kitchen. diltiazem (CARDIZEM CD) 120 MG 24 hr capsule Take 1 capsule (120 mg total) by mouth daily.  Marland Kitchen. ENSURE PLUS (ENSURE PLUS) LIQD Take 120 mLs by mouth 3 (three) times daily between meals.  . ferrous sulfate 325 (65 FE) MG tablet Take 325 mg by mouth daily with breakfast.  .  haloperidol (HALDOL) 1 MG tablet Take 1 tablet (1 mg total) by mouth 2 (two) times daily as needed for agitation.  Marland Kitchen levothyroxine (SYNTHROID, LEVOTHROID) 125 MCG tablet Take 0.5 tablets (62.5 mcg total) by mouth daily before breakfast.  . mirtazapine (REMERON SOL-TAB) 15 MG disintegrating tablet Take 15 mg by mouth at bedtime.  . polyvinyl alcohol (LIQUIFILM TEARS) 1.4 % ophthalmic solution Place 1 drop into both eyes 4 (four)  times daily as needed for dry eyes.  . sodium bicarbonate 650 MG tablet Take 1 tablet (650 mg total) by mouth 2 (two) times daily.  . vancomycin (VANCOCIN) 50 mg/mL oral solution Take 2.5 mLs (125 mg total) by mouth every 6 (six) hours.   No facility-administered encounter medications on file as of 10/12/2018.     PHYSICAL EXAM/ ROS with Shela Nevin, RN 114 lb General: NAD, frail appearing, thin,min. verbal Cardiovascular:not able to report chest pain Pulmonary: no cough. Uses oxygen 2 l. Abdomen: eats 25% most of the time. Full assistance with feeding. Incontinent of bowels GU: incontinent Extremities: no edema,  Contractures of LE Skin: no rashes , left arm skin tear Neurological: Weakness , advanced dementia FAST 7c-d  Marijo File DNP, AGPCNP-BC

## 2018-11-08 ENCOUNTER — Telehealth: Payer: Self-pay | Admitting: Primary Care

## 2018-11-08 NOTE — Telephone Encounter (Signed)
T/c to nursing home to make appt for palliative medicine visit. Was advised by SNF staff that patient passed away this morning at [facility].

## 2018-11-09 ENCOUNTER — Non-Acute Institutional Stay: Payer: Medicare Other | Admitting: Primary Care

## 2018-11-21 DEATH — deceased
